# Patient Record
Sex: Female | Born: 1955 | Race: Black or African American | Hispanic: No | Marital: Single | Smoking: Never smoker
Health system: Southern US, Community
[De-identification: ages and names within clinical notes are randomized; demographics above are authoritative.]

## PROBLEM LIST (undated history)

## (undated) DIAGNOSIS — Z8639 Personal history of other endocrine, nutritional and metabolic disease: Secondary | ICD-10-CM

## (undated) DIAGNOSIS — G894 Chronic pain syndrome: Secondary | ICD-10-CM

## (undated) DIAGNOSIS — M19011 Primary osteoarthritis, right shoulder: Secondary | ICD-10-CM

## (undated) DIAGNOSIS — M19272 Secondary osteoarthritis, left ankle and foot: Secondary | ICD-10-CM

## (undated) DIAGNOSIS — K089 Disorder of teeth and supporting structures, unspecified: Secondary | ICD-10-CM

## (undated) DIAGNOSIS — D638 Anemia in other chronic diseases classified elsewhere: Secondary | ICD-10-CM

## (undated) DIAGNOSIS — R532 Functional quadriplegia: Secondary | ICD-10-CM

## (undated) DIAGNOSIS — M19012 Primary osteoarthritis, left shoulder: Secondary | ICD-10-CM

## (undated) DIAGNOSIS — Z872 Personal history of diseases of the skin and subcutaneous tissue: Secondary | ICD-10-CM

## (undated) HISTORY — DX: Chronic pain syndrome: G89.4

## (undated) HISTORY — DX: Personal history of diseases of the skin and subcutaneous tissue: Z87.2

## (undated) HISTORY — DX: Secondary osteoarthritis, left ankle and foot: M19.272

## (undated) HISTORY — DX: Anemia in other chronic diseases classified elsewhere: D63.8

## (undated) HISTORY — DX: Primary osteoarthritis, right shoulder: M19.011

## (undated) HISTORY — DX: Primary osteoarthritis, right shoulder: M19.012

## (undated) HISTORY — PX: TUBAL LIGATION: SHX77

## (undated) HISTORY — DX: Disorder of teeth and supporting structures, unspecified: K08.9

## (undated) HISTORY — DX: Functional quadriplegia: R53.2

## (undated) HISTORY — DX: Personal history of other endocrine, nutritional and metabolic disease: Z86.39

---

## 1998-11-05 ENCOUNTER — Emergency Department (HOSPITAL_COMMUNITY): Admission: EM | Admit: 1998-11-05 | Discharge: 1998-11-05 | Payer: Self-pay | Admitting: Emergency Medicine

## 1998-11-05 ENCOUNTER — Encounter: Payer: Self-pay | Admitting: Emergency Medicine

## 1998-11-06 ENCOUNTER — Emergency Department (HOSPITAL_COMMUNITY): Admission: EM | Admit: 1998-11-06 | Discharge: 1998-11-06 | Payer: Self-pay

## 1998-12-06 ENCOUNTER — Encounter: Admission: RE | Admit: 1998-12-06 | Discharge: 1998-12-20 | Payer: Self-pay | Admitting: Family Medicine

## 2000-04-13 ENCOUNTER — Emergency Department (HOSPITAL_COMMUNITY): Admission: EM | Admit: 2000-04-13 | Discharge: 2000-04-13 | Payer: Self-pay | Admitting: Emergency Medicine

## 2000-04-13 ENCOUNTER — Encounter: Payer: Self-pay | Admitting: Emergency Medicine

## 2001-02-20 ENCOUNTER — Emergency Department (HOSPITAL_COMMUNITY): Admission: EM | Admit: 2001-02-20 | Discharge: 2001-02-20 | Payer: Self-pay | Admitting: Emergency Medicine

## 2001-02-20 ENCOUNTER — Encounter: Payer: Self-pay | Admitting: Emergency Medicine

## 2001-03-03 ENCOUNTER — Emergency Department (HOSPITAL_COMMUNITY): Admission: EM | Admit: 2001-03-03 | Discharge: 2001-03-03 | Payer: Self-pay | Admitting: Emergency Medicine

## 2005-11-19 ENCOUNTER — Emergency Department (HOSPITAL_COMMUNITY): Admission: EM | Admit: 2005-11-19 | Discharge: 2005-11-19 | Payer: Self-pay | Admitting: Family Medicine

## 2007-04-18 ENCOUNTER — Emergency Department (HOSPITAL_COMMUNITY): Admission: EM | Admit: 2007-04-18 | Discharge: 2007-04-18 | Payer: Self-pay | Admitting: Emergency Medicine

## 2007-05-02 ENCOUNTER — Emergency Department (HOSPITAL_COMMUNITY): Admission: EM | Admit: 2007-05-02 | Discharge: 2007-05-02 | Payer: Self-pay | Admitting: Emergency Medicine

## 2008-08-11 ENCOUNTER — Emergency Department (HOSPITAL_COMMUNITY): Admission: EM | Admit: 2008-08-11 | Discharge: 2008-08-12 | Payer: Self-pay | Admitting: Emergency Medicine

## 2010-10-30 LAB — POCT CARDIAC MARKERS
Myoglobin, poc: 41.9
Operator id: 285841

## 2011-01-22 ENCOUNTER — Emergency Department (HOSPITAL_COMMUNITY): Payer: No Typology Code available for payment source

## 2011-01-22 ENCOUNTER — Emergency Department (HOSPITAL_COMMUNITY)
Admission: EM | Admit: 2011-01-22 | Discharge: 2011-01-23 | Disposition: A | Discharge status: Home / Self Care | Payer: No Typology Code available for payment source | Attending: Emergency Medicine | Admitting: Emergency Medicine

## 2011-01-22 ENCOUNTER — Encounter: Payer: Self-pay | Admitting: Emergency Medicine

## 2011-01-22 DIAGNOSIS — R21 Rash and other nonspecific skin eruption: Secondary | ICD-10-CM | POA: Insufficient documentation

## 2011-01-22 DIAGNOSIS — M25559 Pain in unspecified hip: Secondary | ICD-10-CM | POA: Insufficient documentation

## 2011-01-22 DIAGNOSIS — IMO0001 Reserved for inherently not codable concepts without codable children: Secondary | ICD-10-CM | POA: Insufficient documentation

## 2011-01-22 DIAGNOSIS — T148XXA Other injury of unspecified body region, initial encounter: Secondary | ICD-10-CM | POA: Insufficient documentation

## 2011-01-22 DIAGNOSIS — Y9289 Other specified places as the place of occurrence of the external cause: Secondary | ICD-10-CM | POA: Insufficient documentation

## 2011-01-22 DIAGNOSIS — Z79899 Other long term (current) drug therapy: Secondary | ICD-10-CM | POA: Insufficient documentation

## 2011-01-22 MED ORDER — DIAZEPAM 5 MG PO TABS
5.0000 mg | ORAL_TABLET | Freq: Once | ORAL | Status: DC
  Filled 2011-01-22: qty 1

## 2011-01-22 MED ORDER — HYDROCODONE-ACETAMINOPHEN 5-325 MG PO TABS
1.0000 | ORAL_TABLET | Freq: Once | ORAL | Status: DC
  Filled 2011-01-22: qty 1

## 2011-01-22 MED ORDER — IBUPROFEN 800 MG PO TABS
800.0000 mg | ORAL_TABLET | Freq: Once | ORAL | Status: AC
  Administered 2011-01-22: 800 mg via ORAL
  Filled 2011-01-22: qty 1
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
## 2011-01-23 MED ORDER — CYCLOBENZAPRINE HCL 10 MG PO TABS: 10.0000 mg | ORAL_TABLET | Freq: Three times a day (TID) | ORAL | Status: AC | PRN

## 2011-01-23 MED ORDER — IBUPROFEN 400 MG PO TABS: 400.0000 mg | ORAL_TABLET | Freq: Three times a day (TID) | ORAL | Status: AC | PRN

## 2011-01-23 MED ORDER — HYDROCODONE-ACETAMINOPHEN 5-325 MG PO TABS: 1.0000 | ORAL_TABLET | ORAL | Status: AC | PRN
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
## 2011-03-01 ENCOUNTER — Emergency Department (HOSPITAL_COMMUNITY)
Admission: EM | Admit: 2011-03-01 | Discharge: 2011-03-01 | Discharge status: Against Medical Advice | Payer: No Typology Code available for payment source

## 2011-03-02 ENCOUNTER — Emergency Department (HOSPITAL_COMMUNITY)
Admission: EM | Admit: 2011-03-02 | Discharge: 2011-03-02 | Disposition: A | Discharge status: Home / Self Care | Payer: Medicare Other | Attending: Emergency Medicine | Admitting: Emergency Medicine

## 2011-03-02 ENCOUNTER — Encounter (HOSPITAL_COMMUNITY): Payer: Self-pay | Admitting: *Deleted

## 2011-03-02 DIAGNOSIS — I10 Essential (primary) hypertension: Secondary | ICD-10-CM

## 2011-03-02 DIAGNOSIS — S7002XA Contusion of left hip, initial encounter: Secondary | ICD-10-CM

## 2011-03-02 DIAGNOSIS — M25459 Effusion, unspecified hip: Secondary | ICD-10-CM | POA: Insufficient documentation

## 2011-03-02 DIAGNOSIS — S7000XA Contusion of unspecified hip, initial encounter: Secondary | ICD-10-CM | POA: Insufficient documentation

## 2011-03-02 DIAGNOSIS — M25559 Pain in unspecified hip: Secondary | ICD-10-CM | POA: Insufficient documentation

## 2011-03-02 DIAGNOSIS — M7989 Other specified soft tissue disorders: Secondary | ICD-10-CM

## 2011-03-02 DIAGNOSIS — IMO0001 Reserved for inherently not codable concepts without codable children: Secondary | ICD-10-CM | POA: Insufficient documentation

## 2011-03-02 DIAGNOSIS — M549 Dorsalgia, unspecified: Secondary | ICD-10-CM | POA: Insufficient documentation

## 2011-03-02 MED ORDER — IBUPROFEN 800 MG PO TABS: 800.0000 mg | ORAL_TABLET | Freq: Three times a day (TID) | ORAL | Status: AC | PRN

## 2011-03-02 MED ORDER — HYDROCODONE-ACETAMINOPHEN 5-325 MG PO TABS: ORAL_TABLET | ORAL | Status: AC

## 2011-03-02 NOTE — ED Provider Notes (Signed)
Medical screening examination/treatment/procedure(s) were conducted as a shared visit with non-physician practitioner(s) and myself.  I personally evaluated the patient during the encounter   Kyrah Schiro A. Patrica Duel, MD 03/02/11 1610

## 2011-03-02 NOTE — ED Notes (Signed)
Josh EDPA notified re pt's high BP-okayed to d/c pt
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
## 2011-03-02 NOTE — Progress Notes (Signed)
Left lower extremity venous duplex completed at 12:47.  Preliminary report is negative for DVT, SVT, or a Baker's cyst in the left leg.  Negative for DVT in the right common femoral vein.
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
## 2011-06-07 ENCOUNTER — Other Ambulatory Visit (HOSPITAL_COMMUNITY): Payer: Self-pay | Admitting: Orthopedic Surgery

## 2011-06-07 ENCOUNTER — Other Ambulatory Visit (HOSPITAL_COMMUNITY): Payer: Self-pay

## 2011-06-07 DIAGNOSIS — R52 Pain, unspecified: Secondary | ICD-10-CM

## 2011-06-07 DIAGNOSIS — T148XXA Other injury of unspecified body region, initial encounter: Secondary | ICD-10-CM

## 2011-06-10 ENCOUNTER — Inpatient Hospital Stay (HOSPITAL_COMMUNITY): Admission: RE | Admit: 2011-06-10 | Payer: No Typology Code available for payment source | Source: Ambulatory Visit

## 2011-06-15 ENCOUNTER — Ambulatory Visit (HOSPITAL_COMMUNITY)
Admission: RE | Admit: 2011-06-15 | Discharge: 2011-06-15 | Payer: No Typology Code available for payment source | Source: Ambulatory Visit | Attending: Orthopedic Surgery | Admitting: Orthopedic Surgery

## 2011-06-21 ENCOUNTER — Inpatient Hospital Stay (HOSPITAL_COMMUNITY): Admission: RE | Admit: 2011-06-21 | Payer: No Typology Code available for payment source | Source: Ambulatory Visit

## 2011-10-14 ENCOUNTER — Emergency Department (HOSPITAL_BASED_OUTPATIENT_CLINIC_OR_DEPARTMENT_OTHER): Payer: Medicare Other

## 2011-10-14 ENCOUNTER — Encounter (HOSPITAL_BASED_OUTPATIENT_CLINIC_OR_DEPARTMENT_OTHER): Payer: Self-pay | Admitting: *Deleted

## 2011-10-14 ENCOUNTER — Inpatient Hospital Stay (HOSPITAL_BASED_OUTPATIENT_CLINIC_OR_DEPARTMENT_OTHER)
Admission: EM | Admit: 2011-10-14 | Discharge: 2011-10-19 | DRG: 494 | Disposition: A | Discharge status: Rehabilitation Facility | Payer: Medicare Other | Attending: Internal Medicine | Admitting: Internal Medicine

## 2011-10-14 DIAGNOSIS — E876 Hypokalemia: Secondary | ICD-10-CM | POA: Diagnosis present

## 2011-10-14 DIAGNOSIS — R531 Weakness: Secondary | ICD-10-CM

## 2011-10-14 DIAGNOSIS — D649 Anemia, unspecified: Secondary | ICD-10-CM

## 2011-10-14 DIAGNOSIS — R262 Difficulty in walking, not elsewhere classified: Secondary | ICD-10-CM

## 2011-10-14 DIAGNOSIS — S82839A Other fracture of upper and lower end of unspecified fibula, initial encounter for closed fracture: Principal | ICD-10-CM | POA: Diagnosis present

## 2011-10-14 DIAGNOSIS — Y92009 Unspecified place in unspecified non-institutional (private) residence as the place of occurrence of the external cause: Secondary | ICD-10-CM

## 2011-10-14 DIAGNOSIS — W108XXA Fall (on) (from) other stairs and steps, initial encounter: Secondary | ICD-10-CM | POA: Diagnosis present

## 2011-10-14 DIAGNOSIS — W19XXXA Unspecified fall, initial encounter: Secondary | ICD-10-CM

## 2011-10-14 DIAGNOSIS — M810 Age-related osteoporosis without current pathological fracture: Secondary | ICD-10-CM | POA: Diagnosis present

## 2011-10-14 DIAGNOSIS — I10 Essential (primary) hypertension: Secondary | ICD-10-CM

## 2011-10-14 DIAGNOSIS — M170 Bilateral primary osteoarthritis of knee: Secondary | ICD-10-CM

## 2011-10-14 DIAGNOSIS — R5381 Other malaise: Secondary | ICD-10-CM | POA: Diagnosis present

## 2011-10-14 DIAGNOSIS — S93409A Sprain of unspecified ligament of unspecified ankle, initial encounter: Secondary | ICD-10-CM | POA: Diagnosis present

## 2011-10-14 DIAGNOSIS — S82143A Displaced bicondylar fracture of unspecified tibia, initial encounter for closed fracture: Secondary | ICD-10-CM

## 2011-10-14 DIAGNOSIS — M1710 Unilateral primary osteoarthritis, unspecified knee: Secondary | ICD-10-CM | POA: Diagnosis present

## 2011-10-14 DIAGNOSIS — D509 Iron deficiency anemia, unspecified: Secondary | ICD-10-CM | POA: Diagnosis present

## 2011-10-14 DIAGNOSIS — M62838 Other muscle spasm: Secondary | ICD-10-CM

## 2011-10-14 LAB — URINALYSIS, ROUTINE W REFLEX MICROSCOPIC
Bilirubin Urine: NEGATIVE
Nitrite: NEGATIVE
Specific Gravity, Urine: 1.013 (ref 1.005–1.030)
pH: 6.5 (ref 5.0–8.0)

## 2011-10-14 LAB — CBC WITH DIFFERENTIAL/PLATELET
Eosinophils Absolute: 0 10*3/uL (ref 0.0–0.7)
Eosinophils Relative: 0 % (ref 0–5)
HCT: 26.7 % — ABNORMAL LOW (ref 36.0–46.0)
Lymphocytes Relative: 6 % — ABNORMAL LOW (ref 12–46)
Lymphs Abs: 0.8 10*3/uL (ref 0.7–4.0)
MCH: 21.3 pg — ABNORMAL LOW (ref 26.0–34.0)
MCV: 67.8 fL — ABNORMAL LOW (ref 78.0–100.0)
Monocytes Absolute: 0.6 10*3/uL (ref 0.1–1.0)
RBC: 3.94 MIL/uL (ref 3.87–5.11)
WBC: 13.9 10*3/uL — ABNORMAL HIGH (ref 4.0–10.5)

## 2011-10-14 LAB — BASIC METABOLIC PANEL
BUN: 9 mg/dL (ref 6–23)
CO2: 29 mEq/L (ref 19–32)
Chloride: 94 mEq/L — ABNORMAL LOW (ref 96–112)
Creatinine, Ser: 0.4 mg/dL — ABNORMAL LOW (ref 0.50–1.10)
Glucose, Bld: 134 mg/dL — ABNORMAL HIGH (ref 70–99)
Potassium: 2.7 mEq/L — CL (ref 3.5–5.1)

## 2011-10-14 MED ORDER — OXYCODONE-ACETAMINOPHEN 5-325 MG PO TABS
1.0000 | ORAL_TABLET | Freq: Once | ORAL | Status: DC
  Filled 2011-10-14 (×2): qty 1

## 2011-10-14 MED ORDER — ONDANSETRON HCL 4 MG/2ML IJ SOLN
4.0000 mg | Freq: Once | INTRAMUSCULAR | Status: AC
  Administered 2011-10-14: 4 mg via INTRAVENOUS
  Filled 2011-10-14: qty 2

## 2011-10-14 MED ORDER — HYDROMORPHONE HCL PF 1 MG/ML IJ SOLN
INTRAMUSCULAR | Status: AC
  Administered 2011-10-14: 1 mg via INTRAVENOUS
  Filled 2011-10-14: qty 1

## 2011-10-14 MED ORDER — POTASSIUM CHLORIDE 10 MEQ/100ML IV SOLN
10.0000 meq | INTRAVENOUS | Status: AC
  Administered 2011-10-14 (×2): 10 meq via INTRAVENOUS
  Filled 2011-10-14 (×2): qty 100

## 2011-10-14 MED ORDER — ONDANSETRON HCL 4 MG/2ML IJ SOLN
4.0000 mg | Freq: Three times a day (TID) | INTRAMUSCULAR | Status: AC | PRN
  Administered 2011-10-14: 4 mg via INTRAVENOUS

## 2011-10-14 MED ORDER — ONDANSETRON HCL 4 MG/2ML IJ SOLN
INTRAMUSCULAR | Status: AC
  Administered 2011-10-14: 4 mg via INTRAVENOUS
  Filled 2011-10-14: qty 2

## 2011-10-14 MED ORDER — HYDROMORPHONE HCL PF 1 MG/ML IJ SOLN
1.0000 mg | Freq: Once | INTRAMUSCULAR | Status: AC
  Administered 2011-10-14: 1 mg via INTRAVENOUS
  Filled 2011-10-14: qty 1

## 2011-10-14 MED ORDER — HYDROMORPHONE HCL PF 1 MG/ML IJ SOLN
1.0000 mg | INTRAMUSCULAR | Status: AC | PRN
  Administered 2011-10-14: 1 mg via INTRAVENOUS

## 2011-10-14 MED ORDER — SODIUM CHLORIDE 0.9 % IV SOLN
Freq: Once | INTRAVENOUS | Status: AC
  Administered 2011-10-14: 22:00:00 via INTRAVENOUS

## 2011-10-14 NOTE — ED Notes (Signed)
Pt's potassium 2.7 reported to EDP Preston Fleeting
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
## 2011-10-14 NOTE — ED Notes (Signed)
OK for pt to eat per EDP Preston Fleeting

## 2011-10-14 NOTE — ED Notes (Signed)
Pt ate a few bites of applesauce, now vomiting- EDP notified
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
## 2011-10-14 NOTE — ED Notes (Signed)
Pt declined percocet at this time- states she needs something to eat

## 2011-10-14 NOTE — ED Notes (Signed)
Discussed need for in/out cath. Pt refused at this time. EDP Preston Fleeting made aware

## 2011-10-14 NOTE — ED Notes (Signed)
Glick MD at bedside  
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
## 2011-10-15 ENCOUNTER — Inpatient Hospital Stay (HOSPITAL_COMMUNITY): Payer: Medicare Other

## 2011-10-15 ENCOUNTER — Encounter (HOSPITAL_COMMUNITY): Payer: Self-pay | Admitting: Internal Medicine

## 2011-10-15 DIAGNOSIS — R5381 Other malaise: Secondary | ICD-10-CM

## 2011-10-15 DIAGNOSIS — D649 Anemia, unspecified: Secondary | ICD-10-CM

## 2011-10-15 DIAGNOSIS — W19XXXA Unspecified fall, initial encounter: Secondary | ICD-10-CM

## 2011-10-15 DIAGNOSIS — E876 Hypokalemia: Secondary | ICD-10-CM | POA: Diagnosis present

## 2011-10-15 DIAGNOSIS — M62838 Other muscle spasm: Secondary | ICD-10-CM

## 2011-10-15 DIAGNOSIS — R262 Difficulty in walking, not elsewhere classified: Secondary | ICD-10-CM

## 2011-10-15 DIAGNOSIS — S82109A Unspecified fracture of upper end of unspecified tibia, initial encounter for closed fracture: Secondary | ICD-10-CM

## 2011-10-15 LAB — BASIC METABOLIC PANEL
CO2: 27 mEq/L (ref 19–32)
Calcium: 9.6 mg/dL (ref 8.4–10.5)
Chloride: 95 mEq/L — ABNORMAL LOW (ref 96–112)
Creatinine, Ser: 0.38 mg/dL — ABNORMAL LOW (ref 0.50–1.10)
Glucose, Bld: 124 mg/dL — ABNORMAL HIGH (ref 70–99)
Sodium: 132 mEq/L — ABNORMAL LOW (ref 135–145)

## 2011-10-15 LAB — CBC
HCT: 26.5 % — ABNORMAL LOW (ref 36.0–46.0)
MCH: 20.7 pg — ABNORMAL LOW (ref 26.0–34.0)
MCV: 67.6 fL — ABNORMAL LOW (ref 78.0–100.0)
Platelets: 506 10*3/uL — ABNORMAL HIGH (ref 150–400)
RBC: 3.92 MIL/uL (ref 3.87–5.11)
WBC: 10.2 10*3/uL (ref 4.0–10.5)

## 2011-10-15 LAB — IRON AND TIBC
Iron: 13 ug/dL — ABNORMAL LOW (ref 42–135)
Saturation Ratios: 5 % — ABNORMAL LOW (ref 20–55)
UIBC: 240 ug/dL (ref 125–400)

## 2011-10-15 LAB — FERRITIN: Ferritin: 581 ng/mL — ABNORMAL HIGH (ref 10–291)

## 2011-10-15 LAB — FOLATE: Folate: 7.3 ng/mL

## 2011-10-15 MED ORDER — ONDANSETRON HCL 4 MG/2ML IJ SOLN: 4.0000 mg | Freq: Four times a day (QID) | INTRAMUSCULAR | Status: DC | PRN

## 2011-10-15 MED ORDER — METHOCARBAMOL 100 MG/ML IJ SOLN
500.0000 mg | Freq: Four times a day (QID) | INTRAMUSCULAR | Status: DC | PRN
  Filled 2011-10-15: qty 5

## 2011-10-15 MED ORDER — ACETAMINOPHEN 650 MG RE SUPP: 650.0000 mg | Freq: Four times a day (QID) | RECTAL | Status: DC | PRN

## 2011-10-15 MED ORDER — ONDANSETRON HCL 4 MG PO TABS: 4.0000 mg | ORAL_TABLET | Freq: Four times a day (QID) | ORAL | Status: DC | PRN

## 2011-10-15 MED ORDER — HYDROMORPHONE HCL PF 1 MG/ML IJ SOLN
0.5000 mg | INTRAMUSCULAR | Status: DC | PRN
  Administered 2011-10-15 – 2011-10-16 (×5): 1 mg via INTRAVENOUS
  Administered 2011-10-17: 0.5 mg via INTRAVENOUS
  Administered 2011-10-17 (×3): 1 mg via INTRAVENOUS
  Filled 2011-10-15 (×9): qty 1

## 2011-10-15 MED ORDER — ACETAMINOPHEN 325 MG PO TABS
650.0000 mg | ORAL_TABLET | Freq: Four times a day (QID) | ORAL | Status: DC | PRN
  Administered 2011-10-15 – 2011-10-19 (×4): 650 mg via ORAL
  Filled 2011-10-15 (×4): qty 2

## 2011-10-15 MED ORDER — AMLODIPINE BESYLATE 5 MG PO TABS
5.0000 mg | ORAL_TABLET | Freq: Every day | ORAL | Status: DC
  Administered 2011-10-15 – 2011-10-16 (×2): 5 mg via ORAL
  Filled 2011-10-15 (×2): qty 1

## 2011-10-15 MED ORDER — SODIUM CHLORIDE 0.9 % IV SOLN
INTRAVENOUS | Status: DC
  Administered 2011-10-15: 19:00:00 via INTRAVENOUS
  Administered 2011-10-16: 500 mL via INTRAVENOUS
  Administered 2011-10-16: 12:00:00 via INTRAVENOUS

## 2011-10-15 MED ORDER — SODIUM CHLORIDE 0.9 % IV SOLN
INTRAVENOUS | Status: AC
  Administered 2011-10-15: 01:00:00 via INTRAVENOUS

## 2011-10-15 MED ORDER — POTASSIUM CHLORIDE 10 MEQ/100ML IV SOLN
10.0000 meq | INTRAVENOUS | Status: AC
  Administered 2011-10-15 (×4): 10 meq via INTRAVENOUS
  Filled 2011-10-15 (×4): qty 100

## 2011-10-15 MED ORDER — HYDRALAZINE HCL 20 MG/ML IJ SOLN
10.0000 mg | Freq: Four times a day (QID) | INTRAMUSCULAR | Status: DC | PRN
  Administered 2011-10-15 – 2011-10-19 (×5): 10 mg via INTRAVENOUS
  Filled 2011-10-15 (×6): qty 1

## 2011-10-15 NOTE — Progress Notes (Signed)
Pt became nauseated and vomited a small amount once this am. Pt was offered antiemetic but refused, pt was also offered pain medication and refused. Will continue to monitor pt.

## 2011-10-15 NOTE — Progress Notes (Signed)
My associate saw and admitted patient earlier this AM.  Please see H and P for further information.  Agree with the following adjustments based on elevated blood pressure.  Place on amlodipine and continue to monitor blood pressures closely.  Taneeka Curtner, [employer]
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
## 2011-10-15 NOTE — Progress Notes (Signed)
Patient nauseated/vomited small amount. Offered antiemetic; daughter refused. I gave her pain med and nausea subsided. I will continue to monitor her throughout evening.

## 2011-10-15 NOTE — Progress Notes (Signed)
Utilization review completed.  

## 2011-10-15 NOTE — Progress Notes (Signed)
At 0400 pt had not yet voided. She was placed on bed-pan; voided . Performed bladder scan to determine if she is retaining urine. No urine in bladder per scan.

## 2011-10-16 ENCOUNTER — Inpatient Hospital Stay (HOSPITAL_COMMUNITY): Payer: Medicare Other

## 2011-10-16 ENCOUNTER — Encounter (HOSPITAL_COMMUNITY): Payer: Self-pay | Admitting: Anesthesiology

## 2011-10-16 ENCOUNTER — Encounter (HOSPITAL_COMMUNITY)
Admission: EM | Disposition: A | Discharge status: Rehabilitation Facility | Payer: Self-pay | Source: Home / Self Care | Attending: Internal Medicine

## 2011-10-16 ENCOUNTER — Inpatient Hospital Stay (HOSPITAL_COMMUNITY): Payer: Medicare Other | Admitting: Anesthesiology

## 2011-10-16 DIAGNOSIS — S82839A Other fracture of upper and lower end of unspecified fibula, initial encounter for closed fracture: Principal | ICD-10-CM

## 2011-10-16 HISTORY — PX: KNEE CLOSED REDUCTION: SHX995

## 2011-10-16 LAB — CBC
HCT: 24.8 % — ABNORMAL LOW (ref 36.0–46.0)
Hemoglobin: 7.8 g/dL — ABNORMAL LOW (ref 12.0–15.0)
MCH: 21 pg — ABNORMAL LOW (ref 26.0–34.0)
MCHC: 31.5 g/dL (ref 30.0–36.0)
MCV: 66.8 fL — ABNORMAL LOW (ref 78.0–100.0)
RDW: 16.5 % — ABNORMAL HIGH (ref 11.5–15.5)

## 2011-10-16 LAB — ABO/RH: ABO/RH(D): B POS

## 2011-10-16 LAB — BASIC METABOLIC PANEL
BUN: 8 mg/dL (ref 6–23)
Calcium: 9.3 mg/dL (ref 8.4–10.5)
Creatinine, Ser: 0.37 mg/dL — ABNORMAL LOW (ref 0.50–1.10)
GFR calc Af Amer: >90 mL/min (ref >90)
GFR calc non Af Amer: >90 mL/min (ref >90)
Glucose, Bld: 122 mg/dL — ABNORMAL HIGH (ref 70–99)

## 2011-10-16 SURGERY — MANIPULATION, KNEE, CLOSED
Anesthesia: General | Laterality: Left | Wound class: Clean

## 2011-10-16 MED ORDER — POTASSIUM CHLORIDE 10 MEQ/100ML IV SOLN
10.0000 meq | INTRAVENOUS | Status: AC
  Administered 2011-10-16 (×4): 10 meq via INTRAVENOUS
  Filled 2011-10-16 (×4): qty 100

## 2011-10-16 MED ORDER — CEFAZOLIN SODIUM-DEXTROSE 2-3 GM-% IV SOLR
INTRAVENOUS | Status: DC | PRN
  Administered 2011-10-16: 2 g via INTRAVENOUS

## 2011-10-16 MED ORDER — LABETALOL HCL 5 MG/ML IV SOLN
INTRAVENOUS | Status: DC | PRN
  Administered 2011-10-16: 5 mg via INTRAVENOUS
  Administered 2011-10-16: 10 mg via INTRAVENOUS

## 2011-10-16 MED ORDER — LIDOCAINE HCL (CARDIAC) 20 MG/ML IV SOLN
INTRAVENOUS | Status: DC | PRN
  Administered 2011-10-16: 80 mg via INTRAVENOUS

## 2011-10-16 MED ORDER — LACTATED RINGERS IV SOLN
INTRAVENOUS | Status: DC | PRN
  Administered 2011-10-16: 08:00:00 via INTRAVENOUS

## 2011-10-16 MED ORDER — PROPOFOL 10 MG/ML IV BOLUS
INTRAVENOUS | Status: DC | PRN
  Administered 2011-10-16: 180 mg via INTRAVENOUS

## 2011-10-16 MED ORDER — HYDRALAZINE HCL 20 MG/ML IJ SOLN
5.0000 mg | Freq: Once | INTRAMUSCULAR | Status: AC
  Administered 2011-10-16: 5 mg via INTRAVENOUS

## 2011-10-16 MED ORDER — MIDAZOLAM HCL 5 MG/5ML IJ SOLN
INTRAMUSCULAR | Status: DC | PRN
  Administered 2011-10-16: 2 mg via INTRAVENOUS

## 2011-10-16 MED ORDER — HYDRALAZINE HCL 20 MG/ML IJ SOLN
INTRAMUSCULAR | Status: AC
  Filled 2011-10-16: qty 1

## 2011-10-16 MED ORDER — SUCCINYLCHOLINE CHLORIDE 20 MG/ML IJ SOLN
INTRAMUSCULAR | Status: DC | PRN
  Administered 2011-10-16: 100 mg via INTRAVENOUS
  Administered 2011-10-16: 35 mg via INTRAVENOUS

## 2011-10-16 MED ORDER — ACETAMINOPHEN 10 MG/ML IV SOLN
INTRAVENOUS | Status: DC | PRN
  Administered 2011-10-16: 1000 mg via INTRAVENOUS

## 2011-10-16 MED ORDER — ALBUTEROL SULFATE (5 MG/ML) 0.5% IN NEBU
2.5000 mg | INHALATION_SOLUTION | Freq: Four times a day (QID) | RESPIRATORY_TRACT | Status: DC | PRN
  Filled 2011-10-16: qty 0.5

## 2011-10-16 MED ORDER — OXYCODONE HCL 5 MG/5ML PO SOLN
5.0000 mg | Freq: Once | ORAL | Status: DC | PRN
  Filled 2011-10-16: qty 5

## 2011-10-16 MED ORDER — OXYCODONE-ACETAMINOPHEN 5-325 MG PO TABS
1.0000 | ORAL_TABLET | Freq: Four times a day (QID) | ORAL | Status: DC | PRN
  Administered 2011-10-17: 1 via ORAL
  Filled 2011-10-16 (×2): qty 1

## 2011-10-16 MED ORDER — GLYCOPYRROLATE 0.2 MG/ML IJ SOLN
INTRAMUSCULAR | Status: DC | PRN
  Administered 2011-10-16: 0.6 mg via INTRAVENOUS

## 2011-10-16 MED ORDER — HYDROMORPHONE HCL PF 1 MG/ML IJ SOLN: 0.2500 mg | INTRAMUSCULAR | Status: DC | PRN

## 2011-10-16 MED ORDER — OXYCODONE HCL 5 MG PO TABS: 5.0000 mg | ORAL_TABLET | Freq: Once | ORAL | Status: DC | PRN

## 2011-10-16 MED ORDER — POTASSIUM CHLORIDE CRYS ER 20 MEQ PO TBCR
20.0000 meq | EXTENDED_RELEASE_TABLET | Freq: Once | ORAL | Status: AC
  Administered 2011-10-16: 20 meq via ORAL
  Filled 2011-10-16: qty 1

## 2011-10-16 MED ORDER — MEPERIDINE HCL 50 MG/ML IJ SOLN: 6.2500 mg | INTRAMUSCULAR | Status: DC | PRN

## 2011-10-16 MED ORDER — FENTANYL CITRATE 0.05 MG/ML IJ SOLN
INTRAMUSCULAR | Status: DC | PRN
  Administered 2011-10-16: 100 ug via INTRAVENOUS
  Administered 2011-10-16 (×2): 50 ug via INTRAVENOUS

## 2011-10-16 MED ORDER — LACTATED RINGERS IV SOLN: INTRAVENOUS | Status: DC

## 2011-10-16 MED ORDER — ROCURONIUM BROMIDE 100 MG/10ML IV SOLN
INTRAVENOUS | Status: DC | PRN
  Administered 2011-10-16: 5 mg via INTRAVENOUS

## 2011-10-16 MED ORDER — ACETAMINOPHEN 10 MG/ML IV SOLN: 1000.0000 mg | Freq: Once | INTRAVENOUS | Status: DC | PRN

## 2011-10-16 MED ORDER — PROMETHAZINE HCL 25 MG/ML IJ SOLN: 6.2500 mg | INTRAMUSCULAR | Status: DC | PRN

## 2011-10-16 MED ORDER — NEOSTIGMINE METHYLSULFATE 1 MG/ML IJ SOLN
INTRAMUSCULAR | Status: DC | PRN
  Administered 2011-10-16: 4 mg via INTRAVENOUS

## 2011-10-16 MED ORDER — SODIUM CHLORIDE 0.9 % IV SOLN
INTRAVENOUS | Status: DC | PRN
  Administered 2011-10-16: 09:00:00 via INTRAVENOUS

## 2011-10-16 MED ORDER — AMLODIPINE BESYLATE 10 MG PO TABS
10.0000 mg | ORAL_TABLET | Freq: Every day | ORAL | Status: DC
  Administered 2011-10-17 – 2011-10-19 (×3): 10 mg via ORAL
  Filled 2011-10-16 (×3): qty 1

## 2011-10-16 SURGICAL SUPPLY — 46 items
BAG ZIPLOCK 12X15 (MISCELLANEOUS) IMPLANT
BNDG COHESIVE 4X5 TAN STRL (GAUZE/BANDAGES/DRESSINGS) IMPLANT
BNDG COHESIVE 6X5 TAN STRL LF (GAUZE/BANDAGES/DRESSINGS) ×3 IMPLANT
CLOTH BEACON ORANGE TIMEOUT ST (SAFETY) ×3 IMPLANT
CUFF TOURN SGL QUICK 34 (TOURNIQUET CUFF) ×1
CUFF TRNQT CYL 34X4X40X1 (TOURNIQUET CUFF) ×2 IMPLANT
DRAPE C-ARM 42X72 X-RAY (DRAPES) ×3 IMPLANT
DRAPE INCISE IOBAN 66X45 STRL (DRAPES) ×3 IMPLANT
DRAPE LG THREE QUARTER DISP (DRAPES) ×3 IMPLANT
DRAPE U-SHAPE 47X51 STRL (DRAPES) ×3 IMPLANT
DRSG EMULSION OIL 3X16 NADH (GAUZE/BANDAGES/DRESSINGS) IMPLANT
DURAPREP 26ML APPLICATOR (WOUND CARE) ×3 IMPLANT
ELECT REM PT RETURN 9FT ADLT (ELECTROSURGICAL) ×3
ELECTRODE REM PT RTRN 9FT ADLT (ELECTROSURGICAL) ×2 IMPLANT
GLOVE BIOGEL PI IND STRL 7.5 (GLOVE) IMPLANT
GLOVE BIOGEL PI IND STRL 8 (GLOVE) ×2 IMPLANT
GLOVE BIOGEL PI INDICATOR 7.5 (GLOVE)
GLOVE BIOGEL PI INDICATOR 8 (GLOVE) ×1
GLOVE ECLIPSE 8.0 STRL XLNG CF (GLOVE) IMPLANT
GLOVE ORTHO TXT STRL SZ7.5 (GLOVE) ×6 IMPLANT
GLOVE SURG ORTHO 8.0 STRL STRW (GLOVE) ×3 IMPLANT
GOWN BRE IMP PREV XXLGXLNG (GOWN DISPOSABLE) ×6 IMPLANT
GOWN STRL NON-REIN LRG LVL3 (GOWN DISPOSABLE) ×3 IMPLANT
IMMOBILIZER KNEE 20 (SOFTGOODS) ×6
IMMOBILIZER KNEE 20 THIGH 36 (SOFTGOODS) ×4 IMPLANT
KIT BASIN OR (CUSTOM PROCEDURE TRAY) ×3 IMPLANT
MANIFOLD NEPTUNE II (INSTRUMENTS) ×3 IMPLANT
NS IRRIG 1000ML POUR BTL (IV SOLUTION) IMPLANT
PACK LOWER EXTREMITY WL (CUSTOM PROCEDURE TRAY) IMPLANT
PACK TOTAL JOINT (CUSTOM PROCEDURE TRAY) ×3 IMPLANT
PAD CAST 4YDX4 CTTN HI CHSV (CAST SUPPLIES) ×2 IMPLANT
PADDING CAST COTTON 4X4 STRL (CAST SUPPLIES) ×1
POSITIONER SURGICAL ARM (MISCELLANEOUS) ×3 IMPLANT
SET PAD KNEE POSITIONER (MISCELLANEOUS) ×3 IMPLANT
SPONGE GAUZE 4X4 12PLY (GAUZE/BANDAGES/DRESSINGS) ×3 IMPLANT
SPONGE LAP 18X18 X RAY DECT (DISPOSABLE) ×6 IMPLANT
STOCKINETTE 8 INCH (MISCELLANEOUS) ×3 IMPLANT
STRIP CLOSURE SKIN 1/2X4 (GAUZE/BANDAGES/DRESSINGS) IMPLANT
SUCTION FRAZIER TIP 10 FR DISP (SUCTIONS) ×3 IMPLANT
SUT ETHILON 4 0 PS 2 18 (SUTURE) IMPLANT
SUT MNCRL AB 4-0 PS2 18 (SUTURE) ×3 IMPLANT
SUT VIC AB 1 CT1 27 (SUTURE) ×5
SUT VIC AB 1 CT1 27XBRD ANTBC (SUTURE) ×10 IMPLANT
SUT VIC AB 2-0 CT2 27 (SUTURE) IMPLANT
TOWEL OR 17X26 10 PK STRL BLUE (TOWEL DISPOSABLE) ×6 IMPLANT
WATER STERILE IRR 1500ML POUR (IV SOLUTION) IMPLANT

## 2011-10-16 NOTE — Addendum Note (Signed)
Addendum  created 10/16/11 1147 by Bevelyn Buckles, CRNA   Modules edited:Anesthesia LDA

## 2011-10-16 NOTE — Addendum Note (Signed)
Addendum  created 10/16/11 1156 by Bevelyn Buckles, CRNA   Modules edited:Anesthesia LDA

## 2011-10-16 NOTE — Progress Notes (Signed)
[facility] PROGRESS NOTE  April Graves XLK:[id number] DOB: 16-Jul-1955 DOA: 10/14/2011 PCP: No primary provider on file.  Assessment/Plan: Active Problems:  Hypokalemia  Fibula upper end fracture  Anemia  Fall  Unable to walk  Weakness generalized  Muscle spasms of lower extremity  Arthritis of both knees  Osteoporosis  1. Tibial Plateau fracture - Per orthopaedic surgeon.  Was taken to OR but reportedly did not get operation and it was decided to treat conservatively.  Note is not available for review at this time. But reportedly patient will need physical therapy.  Will follow up with ortho's recs once they are available.  2. Hypokalemia - Will replace today.  May be secondary to poor oral intake.  3. Anemia - No active bleeding recently - Will continue to monitor and consider transfusion if patient's hemoglobin decreases past 7.0  4. Hypertension - not controlled.  Will increase amlodipine today - May be exacerbated 2ary to discomfort.  5. DVT prophylaxis - Per ortho   Code Status: full Family Communication: spoke with patient and daughter at bedside Disposition Plan: Pending clinical improvement   Brief narrative: Please refer to HPI  Consultants:  Ortho  Antibiotics:  None  HPI/Subjective: Patient has some localized discomfort which has improved with the current pain regimen. Has questions regarding Ortho plans which I was not able to address during our visit.  Objective: Filed Vitals:   10/16/11 1149 10/16/11 1150 10/16/11 1211 10/16/11 1300  BP: 192/97 196/100 188/109 180/107  Pulse: 88 87 85 99  Temp:  98 F (36.7 C) 97.5 F (36.4 C) 97.8 F (36.6 C)  TempSrc:    Oral  Resp: 20 28 24 27   Height:      Weight:      SpO2: 96% 96% 92% 97%    Intake/Output Summary (Last 24 hours) at 10/16/11 1901 Last data filed at 10/16/11 1300  Gross per 24 hour  Intake   3305 ml  Output   1350 ml  Net   1955 ml   Filed Weights   10/14/11  1818 10/15/11 0048  Weight: 77.111 kg (170 lb) 68.7 kg (151 lb 7.3 oz)    Exam:   General:  Pt in NAD, A and O x 3  Cardiovascular: RRR, No MRG  Respiratory: CTA BL, no wheezes  Abdomen: Soft, NT  Extremities: Braces in place BL at lower extremities.  Data Reviewed: Basic Metabolic Panel:  Lab 10/16/11 3664 10/15/11 0456 10/14/11 2020  NA 131* 132* 134*  K 3.1* 3.6 2.7*  CL 96 95* 94*  CO2 26 27 29   GLUCOSE 122* 124* 134*  BUN 8 8 9   CREATININE 0.37* 0.38* 0.40*  CALCIUM 9.3 9.6 10.0  MG -- 1.8 --  PHOS -- -- --   Liver Function Tests: No results found for this basename: AST:5,ALT:5,ALKPHOS:5,BILITOT:5,PROT:5,ALBUMIN:5 in the last 168 hours No results found for this basename: LIPASE:5,AMYLASE:5 in the last 168 hours No results found for this basename: AMMONIA:5 in the last 168 hours CBC:  Lab 10/16/11 0458 10/15/11 0456 10/14/11 2020  WBC 9.6 10.2 13.9*  NEUTROABS -- -- 12.4*  HGB 7.8* 8.1* 8.4*  HCT 24.8* 26.5* 26.7*  MCV 66.8* 67.6* 67.8*  PLT 468* 506* 535*   Cardiac Enzymes: No results found for this basename: CKTOTAL:5,CKMB:5,CKMBINDEX:5,TROPONINI:5 in the last 168 hours BNP (last 3 results) No results found for this basename: PROBNP:3 in the last 8760 hours CBG: No results found for this basename: GLUCAP:5 in the last 168 hours  Recent Results (from the past 240 hour(s))  SURGICAL PCR SCREEN     Status: Abnormal   Collection Time   10/16/11  7:42 AM      Component Value Range Status Comment   MRSA, PCR NEGATIVE  NEGATIVE Final    Staphylococcus aureus POSITIVE (*) NEGATIVE Final      Studies: Dg Ankle Complete Left  Oct 27, 2011  *RADIOLOGY REPORT*  Clinical Data: Left ankle pain following a fall.  LEFT ANKLE COMPLETE - 3+ VIEW  Comparison: None.  Findings: Diffuse soft tissue swelling, most pronounced laterally. Diffuse osteopenia.  Possible effusion.  No fracture or dislocation seen.  Inferior and posterior calcaneal spurs.  IMPRESSION:  1.   Possible effusion. 2.  No fracture or dislocation seen. 3.  Diffuse osteopenia and calcaneal spurs.   Original Report Authenticated By: Darrol Angel, M.D.    Ct Knee Left Wo Contrast  10/16/2011  *RADIOLOGY REPORT*  Clinical Data: Knee pain status post fall.  Evaluate for tibial plateau fracture.  CT OF THE LEFT KNEE WITHOUT CONTRAST  Technique:  Multidetector CT imaging was performed according to the standard protocol. Multiplanar CT image reconstructions were also generated.  Comparison: Radiographs 10/27/11.  Findings: There is a moderate sized hemarthrosis.  No layering intra-articular fat is demonstrated.  There is osteopenia with tricompartmental degenerative change.  In the medial compartment, there is advanced joint space loss with subchondral cyst formation in the femoral condyle and tibial plateau.  As demonstrated radiographically, there is minimal articular surface irregularity of the medial tibial plateau peripherally.  This is favored to reflect degenerative change, although could reflect a small fracture. There is no depression of the articular surface.  There is a mildly displaced fracture of the fibular neck.  This demonstrates no extension into the proximal tibiofibular joint.  No other acute fractures are identified.  There are patellofemoral and lateral compartment degenerative changes as well.  An intramuscular lipoma is noted incidentally within the popliteus muscle.  There is mild generalized muscular atrophy surrounding the knee.  IMPRESSION:  1.  Mildly displaced fracture of the fibular neck. 2.  Peripheral articular surface irregularity of the medial tibial plateau is favored to reflect degenerative change, although could reflect a small fracture. 3.  No other acute osseous findings.  Moderate sized hemarthrosis. 4.  Tricompartmental degenerative changes with subchondral cyst formation.   Original Report Authenticated By: Gerrianne Scale, M.D.    Ct Knee Right Wo  Contrast  10/16/2011  *RADIOLOGY REPORT*  Clinical Data: Knee pain status post fall.  Evaluate for occult tibial plateau fracture.  CT OF THE RIGHT KNEE WITHOUT CONTRAST  Technique:  Multidetector CT imaging was performed according to the standard protocol. Multiplanar CT image reconstructions were also generated.  Comparison: Radiographs 10/27/2011.  Findings: There is a moderate sized hemarthrosis.  No layering intra-articular fat is demonstrated.  There is generalized osteopenia.  There are tricompartmental degenerative changes with joint space loss and subchondral cyst formation in both femoral condyles and tibial plateaus.  A dominant 2.1 cm subchondral cyst anteriorly in the lateral tibial plateau has sclerotic margins and is likely a geode.  No cortical fracture or depressed tibial plateau fracture is demonstrated.  There is no dislocation.  The extensor mechanism is intact.  As evaluated by CT, the cruciate ligaments appear intact.  IMPRESSION:  1.  No CT evidence of acute fracture or dislocation. 2.  Tricompartmental degenerative changes with subchondral cyst formation, most pronounced anteriorly in the lateral tibial plateau. 3.  Moderate sized hemarthrosis.  If the patient fails to respond to conservative therapy, follow-up MRI may be helpful to exclude occult fracture or internal derangement.   Original Report Authenticated By: Gerrianne Scale, M.D.    Dg Knee Complete 4 Views Left  10/14/2011  *RADIOLOGY REPORT*  Clinical Data: Left knee pain following a fall down stairs.  LEFT KNEE - COMPLETE 4+ VIEW  Comparison: None.  Findings: Diffuse osteopenia.  Moderate sized effusion.  Moderate medial joint space narrowing.  Small fracture of the articular surface of the medial aspect of the medial tibial plateau.  On one of the oblique views, there is also a suggestion of a minimally displaced lateral tibial plateau in the metaphyseal region.  There is also a mildly displaced fibular head and neck  fracture, best seen on the other oblique view.  IMPRESSION:  1.  Small medial tibial plateau fracture and probable minimally displaced lateral tibial plateau fracture. 2.  Mildly displaced fibular head and neck fracture. 3.  Moderate sized effusion.   Original Report Authenticated By: Darrol Angel, M.D.    Dg Knee Complete 4 Views Right  10/14/2011  *RADIOLOGY REPORT*  Clinical Data: Right knee pain following a fall.  RIGHT KNEE - COMPLETE 4+ VIEW  Comparison: None.  Findings: Diffuse osteopenia.  Moderate sized effusion.  No visible fracture or dislocation.  IMPRESSION:  1.  Moderate sized effusion. 2.  Diffuse osteopenia with no visible fracture.   Original Report Authenticated By: Darrol Angel, M.D.    Dg C-arm 1-60 Min-no Report  10/16/2011  CLINICAL DATA: left Tibial Plateau fracture   C-ARM 1-60 MINUTES  Fluoroscopy was utilized by the requesting physician.  No radiographic  interpretation.     Dg Knee 2 Views Left  10/16/2011  *RADIOLOGY REPORT*  Clinical Data: Closed reduction of fibular fracture.  LEFT KNEE - 3 VIEW  Comparison: CT on 10/15/2011  Findings: Proximal fibular fracture is seen, and is seen in near anatomic alignment.  No other fractures identified.  IMPRESSION: Proximal fibular fracture in near anatomic alignment.   Original Report Authenticated By: Danae Orleans, M.D.     Scheduled Meds:   . amLODipine  5 mg Oral Daily  . hydrALAZINE      . hydrALAZINE  5 mg Intravenous Once  . potassium chloride  10 mEq Intravenous Q1 Hr x 4  . DISCONTD: oxyCODONE-acetaminophen  1 tablet Oral Once   Continuous Infusions:   . sodium chloride 75 mL/hr at 10/16/11 1150  . DISCONTD: lactated ringers      Active Problems:  Hypokalemia  Fibula upper end fracture  Anemia  Fall  Unable to walk  Weakness generalized  Muscle spasms of lower extremity  Arthritis of both knees  Osteoporosis    Time spent: > 35 minutes    Penny Pia  [facility] Pager [phone]. If  8PM-8AM, please contact night-coverage at [email or web address], password [facility] 10/16/2011, 7:01 PM  LOS: 2 days

## 2011-10-16 NOTE — Progress Notes (Signed)
Apresoline 5 mg IVP GIVEN AS ORDERED.

## 2011-10-16 NOTE — Brief Op Note (Signed)
10/14/2011 - 10/16/2011  10:01 AM  PATIENT:  April Graves  56 y.o. female  PRE-OPERATIVE DIAGNOSIS:  Left Tibial Plateau Fracture  POST-OPERATIVE DIAGNOSIS:  Proximal Fibula Fracture, Osteoporosis, Osteoarthritis and flexion contracture  PROCEDURE:  Procedure(s) (LRB) with comments: CLOSED MANIPULATION KNEE () - Proxmial Fibula Closed treatment of left proximal fibular fracture  SURGEON:  Surgeon(s) and Role:    * [name], MD - Primary  PHYSICIAN ASSISTANT: Shelly Coss  ANESTHESIA:   general  EBL:  Total I/O In: 350 [Blood:350] Out: -   BLOOD ADMINISTERED:1 unit PRBC  DRAINS: none   LOCAL MEDICATIONS USED:  NONE  SPECIMEN:  No Specimen  DISPOSITION OF SPECIMEN:  N/A  COUNTS:  NO as procedure was clsoed no instruments used  TOURNIQUET:  * Missing tourniquet times found for documented tourniquets in log:  16109 *  DICTATION: .Other Dictation: Dictation Number [id number]  PLAN OF CARE: Admit to inpatient   PATIENT DISPOSITION:  PACU - hemodynamically stable.   Delay start of Pharmacological VTE agent (>24hrs) due to surgical blood loss or risk of bleeding: no

## 2011-10-16 NOTE — Progress Notes (Signed)
Dr. Renold Don made aware of patient's blood pressures- orders given

## 2011-10-16 NOTE — Anesthesia Postprocedure Evaluation (Signed)
Anesthesia Post Note  Patient: April Graves  Procedure(s) Performed: Procedure(s) (LRB): CLOSED MANIPULATION KNEE ()  Anesthesia type: General  Patient location: PACU  Post pain: Pain level controlled  Post assessment: Post-op Vital signs reviewed  Last Vitals: BP 199/107  Pulse 107  Temp 36.9 C (Oral)  Resp 24  Ht 5\' 4"  (1.626 m)  Wt 151 lb 7.3 oz (68.7 kg)  BMI 26.00 kg/m2  SpO2 92%  Post vital signs: Reviewed  Level of consciousness: sedated  Complications: No apparent anesthesia complications

## 2011-10-16 NOTE — Progress Notes (Signed)
Patient ID: April Graves, female   DOB: Jul 22, 1955, 56 y.o.   MRN: [id number]  Relatively comfortable   No events  To OR today for ORIF LEFT tibial plateau fracture Ordered potassium to be given today  Will make sure there is active type and cross for OR today due to drop in Hgb  Consent on chart

## 2011-10-16 NOTE — Progress Notes (Signed)
Dr. Renold Don made aware of patient's blood pressures- O.k. To go back to floor- Dr. Charlann Boxer  Made aware also-

## 2011-10-16 NOTE — Anesthesia Procedure Notes (Addendum)
Procedures

## 2011-10-16 NOTE — Anesthesia Preprocedure Evaluation (Addendum)
Anesthesia Evaluation  Patient identified by MRN, date of birth, ID band Patient awake    Reviewed: Allergy & Precautions, H&P , NPO status , Patient's Chart, lab work & pertinent test results  History of Anesthesia Complications Negative for: history of anesthetic complications  Airway Mallampati: II TM Distance: >3 FB Neck ROM: Full    Dental  (+) Dental Advisory Given   Pulmonary asthma ,  breath sounds clear to auscultation  Pulmonary exam normal       Cardiovascular hypertension, Pt. on medications Rhythm:Regular Rate:Normal     Neuro/Psych negative neurological ROS  negative psych ROS   GI/Hepatic negative GI ROS, Neg liver ROS,   Endo/Other  negative endocrine ROS  Renal/GU negative Renal ROS     Musculoskeletal negative musculoskeletal ROS (+)   Abdominal   Peds  Hematology negative hematology ROS (+)   Anesthesia Other Findings   Reproductive/Obstetrics                        Anesthesia Physical Anesthesia Plan  ASA: III  Anesthesia Plan: General   Post-op Pain Management:    Induction: Intravenous and Rapid sequence  Airway Management Planned: Oral ETT  Additional Equipment:   Intra-op Plan:   Post-operative Plan: Extubation in OR  Informed Consent: I have reviewed the patients History and Physical, chart, labs and discussed the procedure including the risks, benefits and alternatives for the proposed anesthesia with the patient or authorized representative who has indicated his/her understanding and acceptance.   Dental advisory given  Plan Discussed with: CRNA and Surgeon  Anesthesia Plan Comments:        Anesthesia Quick Evaluation

## 2011-10-16 NOTE — Transfer of Care (Signed)
Immediate Anesthesia Transfer of Care Note  Patient: April Graves  Procedure(s) Performed: Procedure(s) (LRB) with comments: CLOSED MANIPULATION KNEE () - Proxmial Fibula  Patient Location: PACU  Anesthesia Type: General  Level of Consciousness: awake, alert  and oriented  Airway & Oxygen Therapy: Patient Spontanous Breathing and Patient connected to face mask oxygen  Post-op Assessment: Report given to PACU RN and Post -op Vital signs reviewed and stable  Post vital signs: Reviewed and stable  Complications: No apparent anesthesia complications

## 2011-10-16 NOTE — Addendum Note (Signed)
Addendum  created 10/16/11 1117 by Gaylan Gerold, MD   Modules edited:Orders

## 2011-10-17 ENCOUNTER — Inpatient Hospital Stay (HOSPITAL_COMMUNITY): Payer: Medicare Other

## 2011-10-17 ENCOUNTER — Encounter (HOSPITAL_COMMUNITY): Payer: Self-pay | Admitting: Orthopedic Surgery

## 2011-10-17 DIAGNOSIS — I10 Essential (primary) hypertension: Secondary | ICD-10-CM

## 2011-10-17 LAB — BASIC METABOLIC PANEL
BUN: 6 mg/dL (ref 6–23)
Calcium: 9.5 mg/dL (ref 8.4–10.5)
Creatinine, Ser: 0.41 mg/dL — ABNORMAL LOW (ref 0.50–1.10)
GFR calc non Af Amer: >90 mL/min (ref >90)
Glucose, Bld: 108 mg/dL — ABNORMAL HIGH (ref 70–99)
Sodium: 133 mEq/L — ABNORMAL LOW (ref 135–145)

## 2011-10-17 LAB — CBC
MCH: 22.2 pg — ABNORMAL LOW (ref 26.0–34.0)
MCHC: 32.1 g/dL (ref 30.0–36.0)
MCV: 69.3 fL — ABNORMAL LOW (ref 78.0–100.0)
Platelets: 461 10*3/uL — ABNORMAL HIGH (ref 150–400)
RBC: 4.23 MIL/uL (ref 3.87–5.11)
RDW: 17.9 % — ABNORMAL HIGH (ref 11.5–15.5)

## 2011-10-17 LAB — MAGNESIUM: Magnesium: 2 mg/dL (ref 1.5–2.5)

## 2011-10-17 MED ORDER — METOPROLOL TARTRATE 25 MG PO TABS
25.0000 mg | ORAL_TABLET | Freq: Two times a day (BID) | ORAL | Status: DC
  Administered 2011-10-18 (×2): 25 mg via ORAL
  Filled 2011-10-17 (×3): qty 1

## 2011-10-17 MED ORDER — POTASSIUM CHLORIDE CRYS ER 20 MEQ PO TBCR
40.0000 meq | EXTENDED_RELEASE_TABLET | Freq: Once | ORAL | Status: AC
  Administered 2011-10-17: 40 meq via ORAL
  Filled 2011-10-17: qty 2

## 2011-10-17 MED ORDER — METOPROLOL TARTRATE 25 MG PO TABS
25.0000 mg | ORAL_TABLET | Freq: Two times a day (BID) | ORAL | Status: DC
  Filled 2011-10-17: qty 1

## 2011-10-17 MED ORDER — LISINOPRIL 20 MG PO TABS
20.0000 mg | ORAL_TABLET | Freq: Every day | ORAL | Status: DC
  Filled 2011-10-17: qty 1

## 2011-10-17 MED ORDER — HYDROCHLOROTHIAZIDE 25 MG PO TABS
25.0000 mg | ORAL_TABLET | Freq: Every day | ORAL | Status: DC
  Administered 2011-10-17: 25 mg via ORAL
  Filled 2011-10-17 (×2): qty 1

## 2011-10-17 MED ORDER — METOPROLOL TARTRATE 25 MG PO TABS
25.0000 mg | ORAL_TABLET | Freq: Once | ORAL | Status: AC
  Administered 2011-10-17: 25 mg via ORAL
  Filled 2011-10-17: qty 1

## 2011-10-17 MED ORDER — MUSCLE RUB 10-15 % EX CREA
TOPICAL_CREAM | CUTANEOUS | Status: DC | PRN
  Administered 2011-10-18: 1 via TOPICAL
  Filled 2011-10-17: qty 85

## 2011-10-17 NOTE — Op Note (Signed)
NAMESARAIAH, BHAT NO.:  [id number]  MEDICAL RECORD NO.:  [id number]  LOCATION:  1442                         FACILITY:  [facility]  PHYSICIAN:  Madlyn Frankel. Charlann Boxer, M.D.  DATE OF BIRTH:  1955/05/24  DATE OF PROCEDURE:  10/16/2011 DATE OF DISCHARGE:                              OPERATIVE REPORT   PREOPERATIVE DIAGNOSIS:  Left tibial plateau fracture.  POSTOPERATIVE DIAGNOSIS: 1. Left proximal fibular fracture. 2. Diffuse osteopenia versus osteoporosis. 3. Osteoarthritis left knee, with flexion contracture.  PROCEDURE: 1. Examination of left knee under anesthesia with no findings for     proximal tibial bone instability and only proximal fibular fracture     identified. 2. Closed reduction of left proximal fibula fracture, application of     knee immobilizers. 3. Examination under anesthesia right with findings of osteoarthritis.     No bony instability. 4. Placement of a knee immobilizer, right knee for arthritic     management.  SURGEON:  Madlyn Frankel. Charlann Boxer, M.D.  ASSISTANT:  Lanney Gins, PA-C.  ANESTHESIA:  General.  SPECIMENS:  None.  COMPLICATION:  None.  INDICATION FOR PROCEDURE:  Ms. Mckiver is a 56 year old female with diffuse loss of functional activity, strength as well as bone mineralization.  She was seen in the emergency room and diagnosed initially with tibial plateau fracture.  CT scan revealed that the fracture may not be as prominent, however, she had a proximal fibular fracture with persistent pain.  I reviewed with Ms. Cundy and daughter her current situation and recommended to go to the operating room with planned open reduction and internal fixation, if necessary versus closed treatment.  Risks and benefits were discussed and reviewed and consent was obtained.  PROCEDURE IN DETAIL:  The patient was brought to operative theater. Once the patient was positioned with a thigh tourniquet adequate anesthesia was administered.  A  time-out was performed identifying the patient, planned procedure, and extremity.  I had a chance to review the plain films and CT scan again.  At this point, under fluoroscopic imaging, I examined her left knee, with stress radiographs with varus valgus loading and to identify no evidence of any bony instability.  She is noted have advanced degenerative changes in both her knees.  She had at least 5-10 degree flexion contracture bilaterally.  Of both knees were examined.  We would only draped out the left knee. The right knee was examined under anesthesia with radiographs in AP and lateral planes, confirming only arthritic changes and diffuse osteopenic changes.  Once, I performed this exam and she was gently woken from her anesthetic.  Both knees were placed in knee mobilizer.  I reviewed these findings with her daughter at this point. Postoperatively, Mr. Fredrik Cove will be admitted back before she would seen and evaluated by physical therapy and she will able to perform weightbearing as tolerated to activity.  We will be arrange to perform bilaterally cortisone injection as it was not consented for this at this point.  We will arrange for this on the floor either tonight or tomorrow.  She will followup in the office on an as-needed basis, at that point, for management.  We will try to arrange  for home physical therapy versus outpatient therapy versus SNIFF.  Questions were encouraged in a note on this recent Single acute.     Madlyn Frankel Charlann Boxer, M.D.     MDO/MEDQ  D:  10/16/2011  T:  10/17/2011  Job:  [id number]

## 2011-10-17 NOTE — Progress Notes (Signed)
Per pt request, knee immobilizers removed even though Pt educated on the order notes that state to remove for ROM for PT.

## 2011-10-17 NOTE — Progress Notes (Signed)
Physical Therapy Treatment Patient Details Name: April Graves MRN: [id number] DOB: 25-Jun-1955 Today's Date: 10/17/2011 Time: 1478-2956 PT Time Calculation (min): 40 min  PT Assessment / Plan / Recommendation Comments on Treatment Session  Pt continues w/ weakness of body not expected for injury sustained. Recommend CIR consult. Discussed case w/ Dr. Janee Morn.     Follow Up Recommendations  Inpatient Rehab    Barriers to Discharge        Equipment Recommendations  Rolling walker with 5" wheels;Wheelchair (measurements)    Recommendations for Other Services Rehab consult;OT consult  Frequency Min 5X/week   Plan Discharge plan remains appropriate;Frequency remains appropriate    Precautions / Restrictions Precautions Precautions: Fall Required Braces or Orthoses: Knee Immobilizer - Left (for comfort.)   Pertinent Vitals/Pain Reports Lower L leg is getting more painful. Marland Kitchen Has been medicated. L knee and lower leg edematous. Appears to have slight increase in active L ankle dorsiflex.    Mobility  Bed Mobility Bed Mobility: Sit to Supine Sit to Supine: 1: +2 Total assist Sit to Supine: Patient Percentage: 20% Details for Bed Mobility Assistance: Pysical A needed for both LEs and trunk. Transfers Transfers: Sit to Stand;Stand to Sit;Stand Pivot Transfers Sit to Stand: 1: +2 Total assist;With upper extremity assist;From chair/3-in-1 Sit to Stand: Patient Percentage: 20% Stand to Sit: 1: +2 Total assist;To chair/3-in-1;Without upper extremity assist;To bed Stand to Sit: Patient Percentage: 20% Stand Pivot Transfers: 1: +2 Total assist Stand Pivot Transfers: Patient Percentage: 20% Details for Transfer Assistance: Pt attempted standing art RW but unablwe to get trunk erect enough. Pt. was unable to use LUE for support and min support of RUE on RW, Unable to tuck in buttocks. Pt. was assisted via 2 man arm lift to pivot bacjk onto bed.     Exercises     PT Diagnosis:    PT  Problem List:   PT Treatment Interventions:     PT Goals Acute Rehab PT Goals Pt will go Sit to Supine/Side: with supervision PT Goal: Sit to Supine/Side - Progress: Progressing toward goal Pt will go Sit to Stand: with min assist PT Goal: Sit to Stand - Progress: Progressing toward goal Pt will go Stand to Sit: with supervision PT Goal: Stand to Sit - Progress: Progressing toward goal Pt will Transfer Bed to Chair/Chair to Bed: with min assist PT Transfer Goal: Bed to Chair/Chair to Bed - Progress: Progressing toward goal  Visit Information  Last PT Received On: 10/17/11 Assistance Needed: +2 PT/OT Co-Evaluation/Treatment: Yes    Subjective Data  Subjective: There is something wrong w/ my L foot.    Cognition  Overall Cognitive Status: Difficult to assess Arousal/Alertness: Awake/alert Orientation Level: Appears intact for tasks assessed Behavior During Session: [facility] for tasks performed    Balance  Static Sitting Balance Static Sitting - Level of Assistance: 4: Min assist Static Sitting - Comment/# of Minutes: Pt leans heavily to the left and has difficulty bringing trunk to midline position.  End of Session PT - End of Session Equipment Utilized During Treatment: Gait belt Activity Tolerance: Patient limited by fatigue Patient left: in bed;with call bell/phone within reach;with bed alarm set;with family/visitor present Nurse Communication: Mobility status;Need for lift equipment   GP     Rada Hay 10/17/2011, 4:53 PM

## 2011-10-17 NOTE — Evaluation (Signed)
Physical Therapy Evaluation Patient Details Name: NONA GRACEY MRN: [id number] DOB: January 17, 1956 Today's Date: 10/17/2011 Time: 2130-8657 PT Time Calculation (min): 59 min  PT Assessment / Plan / Recommendation Clinical Impression  Pt. was admitted 10/14/11 after fall on steps when her legs gave per chart. Pt is s/p closed reduction of L proximal fibula FX on 9/ 10. Ortho found no L tibial plateau fx. that required ORIF. KI ordered for bil. Knees. Spoke w/ Ortho PA who stated ok to ambulate without eithwer KI. WBAT L also. Pt presents w/ decreased UE and  LE strength, Left appears weaker. Also, has pain of L shoulder and weakness there also. Daughter reoports pt has not been able to feed herself since fall down steps.  Pt also appears to have decreased  dynamic balance.. Daughter states pt. was Independent PTA, able to negotiate flight of steps and drove. Daughter states that pt. speech does not sound differently. Pt. is slow to follow commands and requires repetition of instruction and explantions several times.  Pt. will benefit from PT to improve functional mobility and continue to evaluate motor status. Pt. may benefit from CIR to return to functional level to DC to home. Pt. has 24/7 caregivers.7Pt is noted to have high BP today during RX. RN aware. 195/107 pre and during 198/107. HR 107    PT Assessment  Patient needs continued PT services    Follow Up Recommendations  Inpatient Rehab    Barriers to Discharge        Equipment Recommendations  Rolling walker with 5" wheels;Wheelchair (measurements);3 in 1 bedside comode    Recommendations for Other Services OT consult;Rehab consult   Frequency Min 5X/week    Precautions / Restrictions Precautions Precautions: Fall Required Braces or Orthoses: Knee Immobilizer - Left and right Knee Immobilizer - Left:  (per  ortho, ok to not use either, for comfort.)   Pertinent Vitals/Pain Pre BP 195/107 during 183/ 107; sats 955 RA, HR 105 RN  aware.      Mobility  Bed Mobility Bed Mobility: Supine to Sit Supine to Sit: 2: Max assist;HOB elevated Details for Bed Mobility Assistance: HOB raised to max height., TC and assist for moving to upright position. Pt was unable to push w/  LUE. Transfers Transfers: Sit to Stand;Stand to Sit;Not assessed Transfer via Lift Equipment: Kandee Keen Details for Transfer Assistance: Pt. was deemed no strong enough in LE and UE to stand at [facility]. Sara+ used and L KI, had to maneuvre L leg as stood up. Pt. had difficulty standing erect in sara+. moved to recliner via lift. Ambulation/Gait Ambulation/Gait Assistance: Not tested (comment)    Exercises     PT Diagnosis: Difficulty walking;Generalized weakness;Acute pain  PT Problem List: Decreased strength;Decreased range of motion;Decreased activity tolerance;Decreased balance;Decreased mobility;Decreased coordination;Decreased cognition;Decreased knowledge of use of DME;Decreased safety awareness;Decreased knowledge of precautions;Pain;Cardiopulmonary status limiting activity;Impaired sensation PT Treatment Interventions: DME instruction;Gait training;Functional mobility training;Therapeutic activities;Therapeutic exercise;Balance training;Patient/family education   PT Goals Acute Rehab PT Goals PT Goal Formulation: With patient/family Time For Goal Achievement: 10/31/11 Potential to Achieve Goals: Good Pt will go Supine/Side to Sit: with supervision PT Goal: Supine/Side to Sit - Progress: Goal set today Pt will go Sit to Supine/Side: with supervision PT Goal: Sit to Supine/Side - Progress: Goal set today Pt will go Sit to Stand: with min assist PT Goal: Sit to Stand - Progress: Goal set today Pt will go Stand to Sit: with supervision PT Goal: Stand to Sit - Progress: Goal set  today Pt will Transfer Bed to Chair/Chair to Bed: with min assist PT Transfer Goal: Bed to Chair/Chair to Bed - Progress: Goal set today Pt will Ambulate: 16 - 50  feet;with min assist;with least restrictive assistive device PT Goal: Ambulate - Progress: Goal set today  Visit Information  Last PT Received On: 10/17/11 Assistance Needed: +2    Subjective Data  Subjective: I am having difficulty moving . Patient Stated Goal: to walk   Prior Functioning  Home Living Lives With: Family;Daughter;Son Available Help at Discharge: Family Type of Home: House Home Access: Stairs to enter Entrance Stairs-Number of Steps: 1 Home Layout: Two level;Full bath on main level Alternate Level Stairs-Number of Steps: Pt/daughter state can have bed on first floor. Alternate Level Stairs-Rails: Left Bathroom Shower/Tub: Engineer, manufacturing systems: Standard Home Adaptive Equipment: None Prior Function Level of Independence: Independent Able to Take Stairs?: No Driving: Yes Vocation: Retired    IT consultant  Overall Cognitive Status: Difficult to assess Difficult to assess due to: Other (comment) (speech appears slightly slurred, daughter states sounds norm) Arousal/Alertness: Awake/alert Cognition - Other Comments: Pt. appears delayed in following commands and understanding explantions about current situation.  daughter does not acknowledge a change in pt. Explanations have to stated multiple times.    Extremity/Trunk Assessment Right Upper Extremity Assessment RUE ROM/Strength/Tone: Deficits RUE ROM/Strength/Tone Deficits: Has difficulty reaching to top of head but is able although movement is slowed, able to take cup and drink from it. able to touch thumb to each finger tip, slower than expected. Takes mod. resistance to shoulder extension  RUE Sensation: Deficits RUE Sensation Deficits: reports lateral fingers feel a little numb. Left Upper Extremity Assessment LUE ROM/Strength/Tone: Deficits LUE ROM/Strength/Tone Deficits: unable to take hand higher than mough, gives to resistance for extension, pt reports shoulder pain, ?if gives due to pain. able to  touch thumb to each finger tip, slower than  r. LUE Sensation: WFL - Light Touch Right Lower Extremity Assessment RLE ROM/Strength/Tone: Deficits RLE ROM/Strength/Tone Deficits: knee ext./hip flex 3+/5. Dorsiflexion 4/5 RLE Sensation: Deficits RLE Sensation Deficits: diminished medial foot-- although some responses inconsistent RLE Coordination: Deficits RLE Coordination Deficits: slow RRM of ankle and knee. Left Lower Extremity Assessment LLE ROM/Strength/Tone: Deficits LLE ROM/Strength/Tone Deficits: knee ext 2+, could be asso. w/ pain.  dorsiflex 2/5 decreased eversion noted. LLE Sensation: Deficits LLE Sensation Deficits: pt. stated felt like electricity in her leg and foot. LLE Coordination: Deficits   Balance Balance Balance Assessed: Yes Static Sitting Balance Static Sitting - Balance Support: Feet supported;Bilateral upper extremity supported Static Sitting - Level of Assistance: 4: Min assist;5: Stand by assistance Static Sitting - Comment/# of Minutes: Initially pt required steady assist.Pt. able to sit w/ stand by. When pt leaned to L ,lost balance and fell to bed that had HOB still at max level. Pt. had difficulty righting self.  End of Session PT - End of Session Equipment Utilized During Treatment: Left knee immobilizer Activity Tolerance: Patient limited by fatigue;Treatment limited secondary to medical complications (Comment) (significant weakness of UE/LE's) Patient left: in chair;with call bell/phone within reach;with chair alarm set;with family/visitor present Nurse Communication: Mobility status;Need for lift equipment  GP     Rada Hay 10/17/2011, 11:53 AM  [phone]

## 2011-10-17 NOTE — Progress Notes (Signed)
[facility] PROGRESS NOTE  April Graves ZOX:[id number] DOB: [birth year] DOA: 10/14/2011 PCP: No primary provider on file.  Assessment/Plan: Principal Problem:  *Fibula upper end fracture Active Problems:  Hypokalemia  Anemia  Fall  Unable to walk  Weakness generalized  Muscle spasms of lower extremity  Arthritis of both knees  Osteoporosis  HTN (hypertension)  1. Left proximal fibula fracture - Per orthopaedic surgeon.  Patient is status post close reduction. Continue knee immobilizer. Pain management. PT OT. We'll consult with CIR to see whether patient is an appropriate candidate. Per orthopedics.  2. Hypokalemia - Will replace today.  May be secondary to poor oral intake.  3. Anemia - No active bleeding recently - Will continue to monitor and consider transfusion if patient's hemoglobin decreases past 7.0  4. Hypertension - May have a component of pain. Poorly controlled. Norvasc was increased to 10 mg yesterday. We'll place on Lopressor 25 mg by mouth twice a day. Will start HCTZ 25 mg daily. If no further improvement may consider increasing Lopressor or the Addition of the ACE inhibitor.  5 muscular pain/inability to lift left upper extremity Will get plain films of the left shoulder and left humerus. Pain management.  6. DVT prophylaxis - Per ortho   Code Status: full Family Communication: spoke with patient and daughter at bedside Disposition Plan: Pending clinical improvement. CIR vs SNF   Brief narrative: Please refer to HPI  Consultants:  Ortho : Dr. Charlann Boxer  Procedures Close reduction of left proximal fibula fracture and application of knee immobilizers 10/16/2011  Antibiotics:  None  HPI/Subjective: Patient complaining of diffuse muscle pain especially in the left shoulder and on. Region with decreased mobility and elevation of the left upper extremity. Patient also complaining of pain in the ankles.  Objective: Filed Vitals:   10/17/11  1230 10/17/11 1418 10/17/11 1502 10/17/11 1545  BP: 180/108 188/99 202/116 194/84  Pulse: 121 122    Temp: 98.8 F (37.1 C) 98.9 F (37.2 C)    TempSrc: Oral Oral    Resp: 20 20    Height:      Weight:      SpO2: 97% 95%      Intake/Output Summary (Last 24 hours) at 10/17/11 1636 Last data filed at 10/17/11 1300  Gross per 24 hour  Intake   1060 ml  Output   3800 ml  Net  -2740 ml   Filed Weights   10/14/11 1818 10/15/11 0048  Weight: 77.111 kg (170 lb) 68.7 kg (151 lb 7.3 oz)    Exam:   General:  Pt in NAD, A and O x 3  Cardiovascular: RRR, No MRG  Respiratory: CTA BL, no wheezes  Abdomen: Soft, NT  Extremities: Braces in place BL at lower extremities.  Data Reviewed: Basic Metabolic Panel:  Lab 10/17/11 8119 10/16/11 0458 10/15/11 0456 10/14/11 2020  NA 133* 131* 132* 134*  K 3.5 3.1* 3.6 2.7*  CL 99 96 95* 94*  CO2 26 26 27 29   GLUCOSE 108* 122* 124* 134*  BUN 6 8 8 9   CREATININE 0.41* 0.37* 0.38* 0.40*  CALCIUM 9.5 9.3 9.6 10.0  MG 2.0 -- 1.8 --  PHOS -- -- -- --   Liver Function Tests: No results found for this basename: AST:5,ALT:5,ALKPHOS:5,BILITOT:5,PROT:5,ALBUMIN:5 in the last 168 hours No results found for this basename: LIPASE:5,AMYLASE:5 in the last 168 hours No results found for this basename: AMMONIA:5 in the last 168 hours CBC:  Lab 10/17/11 0750 10/16/11 0458 10/15/11 0456  10/14/11 2020  WBC 8.3 9.6 10.2 13.9*  NEUTROABS -- -- -- 12.4*  HGB 9.4* 7.8* 8.1* 8.4*  HCT 29.3* 24.8* 26.5* 26.7*  MCV 69.3* 66.8* 67.6* 67.8*  PLT 461* 468* 506* 535*   Cardiac Enzymes: No results found for this basename: CKTOTAL:5,CKMB:5,CKMBINDEX:5,TROPONINI:5 in the last 168 hours BNP (last 3 results) No results found for this basename: PROBNP:3 in the last 8760 hours CBG: No results found for this basename: GLUCAP:5 in the last 168 hours  Recent Results (from the past 240 hour(s))  SURGICAL PCR SCREEN     Status: Abnormal   Collection Time    10/16/11  7:42 AM      Component Value Range Status Comment   MRSA, PCR NEGATIVE  NEGATIVE Final    Staphylococcus aureus POSITIVE (*) NEGATIVE Final      Studies: Ct Knee Left Wo Contrast  10/16/2011  *RADIOLOGY REPORT*  Clinical Data: Knee pain status post fall.  Evaluate for tibial plateau fracture.  CT OF THE LEFT KNEE WITHOUT CONTRAST  Technique:  Multidetector CT imaging was performed according to the standard protocol. Multiplanar CT image reconstructions were also generated.  Comparison: Radiographs 10/14/2011.  Findings: There is a moderate sized hemarthrosis.  No layering intra-articular fat is demonstrated.  There is osteopenia with tricompartmental degenerative change.  In the medial compartment, there is advanced joint space loss with subchondral cyst formation in the femoral condyle and tibial plateau.  As demonstrated radiographically, there is minimal articular surface irregularity of the medial tibial plateau peripherally.  This is favored to reflect degenerative change, although could reflect a small fracture. There is no depression of the articular surface.  There is a mildly displaced fracture of the fibular neck.  This demonstrates no extension into the proximal tibiofibular joint.  No other acute fractures are identified.  There are patellofemoral and lateral compartment degenerative changes as well.  An intramuscular lipoma is noted incidentally within the popliteus muscle.  There is mild generalized muscular atrophy surrounding the knee.  IMPRESSION:  1.  Mildly displaced fracture of the fibular neck. 2.  Peripheral articular surface irregularity of the medial tibial plateau is favored to reflect degenerative change, although could reflect a small fracture. 3.  No other acute osseous findings.  Moderate sized hemarthrosis. 4.  Tricompartmental degenerative changes with subchondral cyst formation.   Original Report Authenticated By: Gerrianne Scale, M.D.    Ct Knee Right Wo  Contrast  10/16/2011  *RADIOLOGY REPORT*  Clinical Data: Knee pain status post fall.  Evaluate for occult tibial plateau fracture.  CT OF THE RIGHT KNEE WITHOUT CONTRAST  Technique:  Multidetector CT imaging was performed according to the standard protocol. Multiplanar CT image reconstructions were also generated.  Comparison: Radiographs 10/14/2011.  Findings: There is a moderate sized hemarthrosis.  No layering intra-articular fat is demonstrated.  There is generalized osteopenia.  There are tricompartmental degenerative changes with joint space loss and subchondral cyst formation in both femoral condyles and tibial plateaus.  A dominant 2.1 cm subchondral cyst anteriorly in the lateral tibial plateau has sclerotic margins and is likely a geode.  No cortical fracture or depressed tibial plateau fracture is demonstrated.  There is no dislocation.  The extensor mechanism is intact.  As evaluated by CT, the cruciate ligaments appear intact.  IMPRESSION:  1.  No CT evidence of acute fracture or dislocation. 2.  Tricompartmental degenerative changes with subchondral cyst formation, most pronounced anteriorly in the lateral tibial plateau. 3.  Moderate sized hemarthrosis.  If the patient fails to respond to conservative therapy, follow-up MRI may be helpful to exclude occult fracture or internal derangement.   Original Report Authenticated By: Gerrianne Scale, M.D.    Dg C-arm 1-60 Min-no Report  10/16/2011  CLINICAL DATA: left Tibial Plateau fracture   C-ARM 1-60 MINUTES  Fluoroscopy was utilized by the requesting physician.  No radiographic  interpretation.     Dg Knee 2 Views Left  10/16/2011  *RADIOLOGY REPORT*  Clinical Data: Closed reduction of fibular fracture.  LEFT KNEE - 3 VIEW  Comparison: CT on 10/15/2011  Findings: Proximal fibular fracture is seen, and is seen in near anatomic alignment.  No other fractures identified.  IMPRESSION: Proximal fibular fracture in near anatomic alignment.   Original  Report Authenticated By: Danae Orleans, M.D.     Scheduled Meds:    . amLODipine  10 mg Oral Daily  . hydrALAZINE      . hydrochlorothiazide  25 mg Oral Daily  . metoprolol tartrate  25 mg Oral BID  . metoprolol tartrate  25 mg Oral Once  . potassium chloride  20 mEq Oral Once  . potassium chloride  40 mEq Oral Once  . DISCONTD: amLODipine  5 mg Oral Daily  . DISCONTD: lisinopril  20 mg Oral Daily  . DISCONTD: metoprolol tartrate  25 mg Oral BID   Continuous Infusions:    . DISCONTD: sodium chloride 75 mL/hr at 10/16/11 1150    Principal Problem:  *Fibula upper end fracture Active Problems:  Hypokalemia  Anemia  Fall  Unable to walk  Weakness generalized  Muscle spasms of lower extremity  Arthritis of both knees  Osteoporosis  HTN (hypertension)    Time spent: > 35 minutes    [facility]  [facility] Pager [phone]. If 8PM-8AM, please contact night-coverage at [email or web address], password [facility] 10/17/2011, 4:36 PM  LOS: 3 days

## 2011-10-17 NOTE — Evaluation (Signed)
Occupational Therapy Evaluation Patient Details Name: April Graves MRN: [id number] DOB: Sep 12, 1955 Today's Date: 10/17/2011 Time: 4098-1191 OT Time Calculation (min): 39 min  OT Assessment / Plan / Recommendation Clinical Impression  Pt presents with a closed reduction of proximal fibula s/p fall. Pt extremely weak and deconditioned in both UB and LB. Pt was fully independent prior to this incident. Skilled OT indicated to maximize independence with BADLs to min-mod A level in prep for d/c to next venue of care.    OT Assessment  Patient needs continued OT Services    Follow Up Recommendations  Inpatient Rehab;Skilled nursing facility    Barriers to Discharge Decreased caregiver support;Inaccessible home environment    Equipment Recommendations  Rolling walker with 5" wheels;Wheelchair (measurements);3 in 1 bedside comode    Recommendations for Other Services Rehab consult  Frequency  Min 2X/week    Precautions / Restrictions Precautions Precautions: Fall Required Braces or Orthoses: Knee Immobilizer - Left Knee Immobilizer - Left:  (per  ortho, ok to not use either, for comfort.)   Pertinent Vitals/Pain RN informed therapist that pt's HR reached 140s during mobilization    ADL  Grooming: Simulated;Set up Where Assessed - Grooming: Supported sitting Upper Body Bathing: Simulated;Maximal assistance Where Assessed - Upper Body Bathing: Unsupported sitting Lower Body Bathing: Simulated;+2 Total assistance Lower Body Bathing: Patient Percentage: 0% Where Assessed - Lower Body Bathing: Supported sit to stand Upper Body Dressing: Simulated;Maximal assistance Where Assessed - Upper Body Dressing: Unsupported sitting Lower Body Dressing: Simulated;+2 Total assistance Lower Body Dressing: Patient Percentage: 0% Where Assessed - Lower Body Dressing: Supported sit to stand Toilet Transfer: Simulated;+2 Total assistance Toilet Transfer: Patient Percentage: 10% Statistician  Method: Ambulance person: Other (comment) (BTB) Toileting - Clothing Manipulation and Hygiene: Simulated;+2 Total assistance Toileting - Architect and Hygiene: Patient Percentage: 0% Where Assessed - Toileting Clothing Manipulation and Hygiene: Standing ADL Comments: Pt extremely weak and deconditioned. Performed sit<>stand x2 before returning pt BTB.    OT Diagnosis: Generalized weakness  OT Problem List: Decreased strength;Cardiopulmonary status limiting activity;Decreased safety awareness;Decreased activity tolerance;Decreased knowledge of use of DME or AE;Impaired UE functional use;Pain;Impaired balance (sitting and/or standing) OT Treatment Interventions: Self-care/ADL training;Therapeutic activities;Therapeutic exercise;DME and/or AE instruction;Patient/family education;Balance training   OT Goals Acute Rehab OT Goals OT Goal Formulation: With patient/family Potential to Achieve Goals: Good ADL Goals Pt Will Perform Grooming: with set-up;Sitting, chair;Supine, head of bed up;Supported ADL Goal: Grooming - Progress: Goal set today Pt Will Transfer to Toilet: with mod assist;Squat pivot transfer;Stand pivot transfer;3-in-1 ADL Goal: Toilet Transfer - Progress: Goal set today Pt Will Perform Toileting - Clothing Manipulation: with mod assist;Sitting on 3-in-1 or toilet;Standing ADL Goal: Toileting - Clothing Manipulation - Progress: Goal set today Pt Will Perform Toileting - Hygiene: with mod assist;Sit to stand from 3-in-1/toilet ADL Goal: Toileting - Hygiene - Progress: Goal set today Arm Goals Pt Will Complete Theraband Exer: with supervision, verbal cues required/provided;Bilateral upper extremities;1 set;10 reps;Level 1 Theraband Arm Goal: Theraband Exercises - Progress: Goal set today Miscellaneous OT Goals Miscellaneous OT Goal #1: Pt will complete supine<>sit with min A and tolerate 20 mins of TA in prep for ADLs. OT Goal: Miscellaneous Goal #1  - Progress: Goal set today Miscellaneous OT Goal #2: Pt will sit unsupported EOB x8 min with SBA in prep for seated aDL. OT Goal: Miscellaneous Goal #2 - Progress: Goal set today  Visit Information  Last OT Received On: 10/17/11 Assistance Needed: +2 PT/OT Co-Evaluation/Treatment: Yes  Subjective Data  Subjective: Don't let me fall.... Patient Stated Goal: Walk   Prior Functioning  Vision/Perception  Home Living Lives With: Family;Daughter;Son Available Help at Discharge: Family Type of Home: House Home Access: Stairs to enter Entrance Stairs-Number of Steps: 1 Home Layout: Two level;Full bath on main level Alternate Level Stairs-Number of Steps: Pt/daughter state can have bed on first floor. Alternate Level Stairs-Rails: Left Bathroom Shower/Tub: Engineer, manufacturing systems: Standard Home Adaptive Equipment: None Prior Function Level of Independence: Independent Able to Take Stairs?: No Driving: Yes Vocation: Retired Musician: No difficulties Dominant Hand: Right   Vision - Assessment Vision Assessment: Vision tested Tracking/Visual Pursuits: Able to track stimulus in all quads without difficulty Saccades: Within functional limits Convergence: Within functional limits  Cognition  Overall Cognitive Status: Difficult to assess Difficult to assess due to: Other (comment) (speech appears slightly slurred, daughter states sounds norm) Arousal/Alertness: Awake/alert Cognition - Other Comments: Pt. appears delayed in following commands and understanding explantions about current situation.  daughter does not acknowledge a change in pt. Explanations have to stated multiple times.    Extremity/Trunk Assessment Right Upper Extremity Assessment RUE ROM/Strength/Tone: Deficits RUE ROM/Strength/Tone Deficits: 3+/5 grossly, delayed thumb to finger opposition RUE Sensation: Deficits RUE Sensation Deficits: reports lateral fingers feel a little  numb. Left Upper Extremity Assessment LUE ROM/Strength/Tone: Deficits LUE ROM/Strength/Tone Deficits: Unable to elevate shoulder >~45 degrees. Elbow and distally 3/5. Pt unable to use extremity to assist with RW when attempting to stand. LUE Sensation: WFL - Light Touch Right Lower Extremity Assessment RLE ROM/Strength/Tone: Deficits RLE ROM/Strength/Tone Deficits: knee ext./hip flex 3+/5. Dorsiflexion 4/5 RLE Sensation: Deficits RLE Sensation Deficits: diminished medial foot-- although some responses inconsistent RLE Coordination: Deficits RLE Coordination Deficits: slow RRM of ankle and knee. Left Lower Extremity Assessment LLE ROM/Strength/Tone: Deficits LLE ROM/Strength/Tone Deficits: knee ext 2+, could be asso. w/ pain.  dorsiflex 2/5 decreased eversion noted. LLE Sensation: Deficits LLE Sensation Deficits: pt. stated felt like electricity in her leg and foot. LLE Coordination: Deficits   Mobility  Shoulder Instructions  Bed Mobility Bed Mobility: Sit to Supine Supine to Sit: 2: Max assist;HOB elevated Sit to Supine: 1: +2 Total assist Sit to Supine: Patient Percentage: 20% Details for Bed Mobility Assistance: Pysical A needed for both LEs and trunk. Transfers Transfers: Sit to Stand;Stand to Sit Sit to Stand: 1: +2 Total assist;With upper extremity assist;From chair/3-in-1 Sit to Stand: Patient Percentage: 20% Stand to Sit: 1: +2 Total assist;To chair/3-in-1;Without upper extremity assist;To bed Stand to Sit: Patient Percentage: 20% Transfer via Lift Equipment: Kandee Keen Details for Transfer Assistance: Attempted sit<>stand x2. Pt stands with a flexed posture. Max cues to extend trunk. Stood for ~1 minute max.       Exercise     Balance Balance Balance Assessed: Yes Static Sitting Balance Static Sitting - Balance Support: Feet supported;Bilateral upper extremity supported Static Sitting - Level of Assistance: 4: Min assist Static Sitting - Comment/# of Minutes: Pt  leans heavily to the left and has difficulty bringing trunk to midline position.   End of Session OT - End of Session Equipment Utilized During Treatment: Gait belt;Right knee immobilizer Activity Tolerance: Patient limited by fatigue Patient left: in bed;with call bell/phone within reach;with family/visitor present  GO     Danni Leabo A OTR/L [phone] 10/17/2011, 3:09 PM

## 2011-10-18 DIAGNOSIS — S82109A Unspecified fracture of upper end of unspecified tibia, initial encounter for closed fracture: Secondary | ICD-10-CM

## 2011-10-18 LAB — BASIC METABOLIC PANEL
CO2: 26 mEq/L (ref 19–32)
Calcium: 9.7 mg/dL (ref 8.4–10.5)
Glucose, Bld: 119 mg/dL — ABNORMAL HIGH (ref 70–99)
Potassium: 3.6 mEq/L (ref 3.5–5.1)
Sodium: 131 mEq/L — ABNORMAL LOW (ref 135–145)

## 2011-10-18 LAB — CBC
Hemoglobin: 9.9 g/dL — ABNORMAL LOW (ref 12.0–15.0)
MCH: 22 pg — ABNORMAL LOW (ref 26.0–34.0)
MCV: 69.4 fL — ABNORMAL LOW (ref 78.0–100.0)
Platelets: 499 10*3/uL — ABNORMAL HIGH (ref 150–400)
RBC: 4.51 MIL/uL (ref 3.87–5.11)
WBC: 9.4 10*3/uL (ref 4.0–10.5)

## 2011-10-18 MED ORDER — LIDOCAINE HCL 1 % IJ SOLN
INTRAMUSCULAR | Status: AC
  Filled 2011-10-18: qty 20

## 2011-10-18 MED ORDER — HYDROCODONE-ACETAMINOPHEN 5-325 MG PO TABS: 1.0000 | ORAL_TABLET | ORAL | Status: DC | PRN

## 2011-10-18 MED ORDER — METHYLPREDNISOLONE ACETATE 80 MG/ML IJ SUSP
160.0000 mg | Freq: Once | INTRAMUSCULAR | Status: DC
  Filled 2011-10-18: qty 2

## 2011-10-18 MED ORDER — LISINOPRIL 40 MG PO TABS
40.0000 mg | ORAL_TABLET | Freq: Every day | ORAL | Status: DC
  Administered 2011-10-19: 40 mg via ORAL
  Filled 2011-10-18: qty 1

## 2011-10-18 MED ORDER — LISINOPRIL 20 MG PO TABS
20.0000 mg | ORAL_TABLET | Freq: Every day | ORAL | Status: DC
  Administered 2011-10-18: 20 mg via ORAL
  Filled 2011-10-18: qty 1

## 2011-10-18 MED ORDER — METOPROLOL TARTRATE 50 MG PO TABS
50.0000 mg | ORAL_TABLET | Freq: Two times a day (BID) | ORAL | Status: DC
  Administered 2011-10-18: 50 mg via ORAL
  Filled 2011-10-18 (×3): qty 1

## 2011-10-18 MED ORDER — CYCLOBENZAPRINE HCL 10 MG PO TABS: 10.0000 mg | ORAL_TABLET | Freq: Three times a day (TID) | ORAL | Status: DC | PRN

## 2011-10-18 NOTE — Progress Notes (Signed)
Physical Therapy Treatment Patient Details Name: April Graves MRN: [id number] DOB: 1955/08/27 Today's Date: 10/18/2011 Time: 7829-5621 PT Time Calculation (min): 60 min  PT Assessment / Plan / Recommendation Comments on Treatment Session  Pt. continues to process slowly, has random complaints aboput L ankle, L knee. Did not want to use L KI. Dr. Charlann Boxer stated OK Pt. continues to require wxtensive assistance and frequent redirection for activity. Pt. ' BP remains elevated. L shoulder also continues w/ decreased active movement but appears improved. Xray of L humerus neg. for fracture. Daughter present. continue to recommend CIR as pt is requiring extensive asssistance.    Follow Up Recommendations  Inpatient Rehab    Barriers to Discharge        Equipment Recommendations  Rolling walker with 5" wheels;3 in 1 bedside comode;Wheelchair (measurements)    Recommendations for Other Services Rehab consult;OT consult  Frequency Min 5X/week   Plan Discharge plan remains appropriate;Frequency remains appropriate    Precautions / Restrictions Precautions Precautions: Fall Precaution Comments: HYPERTENSION.MONITOR BP ; encourage knee extension. Required Braces or Orthoses: Knee Immobilizer - Left;Other Brace/Splint (Removed per pt. request. Dr. Charlann Boxer in and stated OK to not use) Other Brace/Splint: ASO ordered. for L ankle.   Pertinent Vitals/Pain BP pre 183/108 HR 100 during 185/11. RN aware.    Mobility  Bed Mobility Bed Mobility: Supine to Sit Supine to Sit: 3: Mod assist Transfers Transfers: Sit to Stand;Stand to Sit;Stand Pivot Transfers Sit to Stand: 1: +2 Total assist;From elevated surface;From chair/3-in-1;With upper extremity assist Sit to Stand: Patient Percentage: 30% Stand to Sit: 3: Mod assist;To chair/3-in-1 (assist to step LLE out as KI  caused foot to stick.) Stand Pivot Transfers: 1: +2 Total assist Stand Pivot Transfers: Patient Percentage: 40% Details for Transfer  Assistance: Pt. had KI on from bed to [facility], removed for BSC to recliner. Mutimodal cue to stand errect. pt. tends to look down. Also has decreased grip on each side of RW. Does not use UE's well. L knee did buckle some when KI removed.    Exercises General Exercises - Lower Extremity Ankle Circles/Pumps: AAROM;Strengthening;Both Long Arc Quad: AROM;Strengthening;Both Heel Slides: AROM;Strengthening;Both Straight Leg Raises: AAROM;Both   PT Diagnosis:    PT Problem List:   PT Treatment Interventions:     PT Goals Acute Rehab PT Goals Pt will go Supine/Side to Sit: with supervision PT Goal: Supine/Side to Sit - Progress: Progressing toward goal Pt will go Sit to Stand: with min assist PT Goal: Sit to Stand - Progress: Progressing toward goal Pt will go Stand to Sit: with min assist PT Goal: Stand to Sit - Progress: Progressing toward goal Pt will Transfer Bed to Chair/Chair to Bed: with min assist PT Transfer Goal: Bed to Chair/Chair to Bed - Progress: Progressing toward goal  Visit Information  Last PT Received On: 10/18/11 Assistance Needed: +2    Subjective Data  Subjective: I need a brace for my ankle., I don't like thaT LONG BRACE.   Cognition  Overall Cognitive Status: Difficult to assess Arousal/Alertness: Awake/alert Orientation Level: Appears intact for tasks assessed Behavior During Session: Anxious Cognition - Other Comments: continues to ask  questions over and over, has difficulty  processing.    Balance  Static Sitting Balance Static Sitting - Balance Support: Bilateral upper extremity supported;Feet supported Static Sitting - Comment/# of Minutes: Pt still lists to L.   End of Session PT - End of Session Equipment Utilized During Treatment: Gait belt;Left knee immobilizer Activity Tolerance: Patient  limited by fatigue;Patient limited by pain Patient left: in chair;with call bell/phone within reach;with chair alarm set;with family/visitor present Nurse  Communication: Mobility status;Need for lift equipment (stedy now that pt. does not want KI.)   GP     Rada Hay 10/18/2011, 11:06 AM
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
## 2011-10-18 NOTE — Progress Notes (Signed)
[facility] PROGRESS NOTE  April Graves JXB:[id number] DOB: 1955/04/07 DOA: 10/14/2011 PCP: No primary provider on file.  Assessment/Plan: Principal Problem:  *Fibula upper end fracture Active Problems:  Hypokalemia  Anemia  Fall  Unable to walk  Weakness generalized  Muscle spasms of lower extremity  Arthritis of both knees  Osteoporosis  HTN (hypertension)  Left ankle sprain  1. Left proximal fibula fracture - Per orthopaedic surgeon.  Patient is status post close reduction. Continue knee immobilizer. Pain management. PT OT. Per CIR, patient is an appropriate candidate. Awaiting insurance approval. Will need to followup with orthopedics 4 weeks post discharge. Per orthopedics.  2. Hypokalemia -  May be secondary to poor oral intake vs diuretics. Repleted.  3. iron deficiency Anemia - No active bleeding recently - Will continue to monitor and consider transfusion if patient's hemoglobin decreases past 7.0. Hemoglobin is stable at 9.9. Will start on Nu iron. Follow.  4. Hypertension - May have a component of pain. Poorly controlled. Norvasc was increased to 10 mg yesterday. Continue  Lopressor 25 mg by mouth twice a day. Will DC HCTZ as patient is complaining of some dizziness and start patient on lisinopril 20 mg daily and up titrate to 40 mg daily for better blood pressure control.  5 muscular pain/inability to lift left upper extremity X-rays negative. Pain management.  6. DVT prophylaxis - Per ortho   Code Status: full Family Communication: spoke with patient and daughter at bedside Disposition Plan: Pending clinical improvement. CIR pending insurance approval   Brief narrative: Please refer to HPI  Consultants:  Ortho : Dr. Charlann Boxer  Procedures Close reduction of left proximal fibula fracture and application of knee immobilizers 10/16/2011  Antibiotics:  None  HPI/Subjective: Patient states felt dizzy after starting BP medications. Patient states  left upper extremity less painful and able to raise him better.  Objective: Filed Vitals:   10/18/11 0414 10/18/11 1330 10/18/11 1400 10/18/11 1545  BP: 168/94 179/92 176/102 174/103  Pulse: 89   102  Temp: 98.8 F (37.1 C)   99.6 F (37.6 C)  TempSrc: Oral   Oral  Resp: 20   18  Height:      Weight:      SpO2: 95%   96%    Intake/Output Summary (Last 24 hours) at 10/18/11 1812 Last data filed at 10/18/11 1800  Gross per 24 hour  Intake    560 ml  Output    570 ml  Net    -10 ml   Filed Weights   10/14/11 1818 10/15/11 0048  Weight: 77.111 kg (170 lb) 68.7 kg (151 lb 7.3 oz)    Exam:   General:  Pt in NAD, A and O x 3  Cardiovascular: RRR, No MRG  Respiratory: CTA BL, no wheezes  Abdomen: Soft, NT  Extremities: Braces in place BL at lower extremities.  Data Reviewed: Basic Metabolic Panel:  Lab 10/18/11 1308 10/17/11 0750 10/16/11 0458 10/15/11 0456 10/14/11 2020  NA 131* 133* 131* 132* 134*  K 3.6 3.5 3.1* 3.6 2.7*  CL 96 99 96 95* 94*  CO2 26 26 26 27 29   GLUCOSE 119* 108* 122* 124* 134*  BUN 7 6 8 8 9   CREATININE 0.43* 0.41* 0.37* 0.38* 0.40*  CALCIUM 9.7 9.5 9.3 9.6 10.0  MG -- 2.0 -- 1.8 --  PHOS -- -- -- -- --   Liver Function Tests: No results found for this basename: AST:5,ALT:5,ALKPHOS:5,BILITOT:5,PROT:5,ALBUMIN:5 in the last 168 hours No results found  for this basename: LIPASE:5,AMYLASE:5 in the last 168 hours No results found for this basename: AMMONIA:5 in the last 168 hours CBC:  Lab 10/18/11 0455 10/17/11 0750 10/16/11 0458 10/15/11 0456 10/14/11 2020  WBC 9.4 8.3 9.6 10.2 13.9*  NEUTROABS -- -- -- -- 12.4*  HGB 9.9* 9.4* 7.8* 8.1* 8.4*  HCT 31.3* 29.3* 24.8* 26.5* 26.7*  MCV 69.4* 69.3* 66.8* 67.6* 67.8*  PLT 499* 461* 468* 506* 535*   Cardiac Enzymes: No results found for this basename: CKTOTAL:5,CKMB:5,CKMBINDEX:5,TROPONINI:5 in the last 168 hours BNP (last 3 results) No results found for this basename: PROBNP:3 in the last  8760 hours CBG: No results found for this basename: GLUCAP:5 in the last 168 hours  Recent Results (from the past 240 hour(s))  SURGICAL PCR SCREEN     Status: Abnormal   Collection Time   10/16/11  7:42 AM      Component Value Range Status Comment   MRSA, PCR NEGATIVE  NEGATIVE Final    Staphylococcus aureus POSITIVE (*) NEGATIVE Final      Studies: Dg Shoulder Left  10/17/2011  *RADIOLOGY REPORT*  Clinical Data: Left shoulder pain.  No known injury.  LEFT SHOULDER - 2+ VIEW  Comparison: None.  Findings: Arthritic changes noted within the left [facility] joint and glenohumeral joint.  Erosive changes noted in the superior humeral head near the rotator cuff insertion, compatible with inflammatory arthropathy.  Degenerative changes in the left AC joint.  No fracture, subluxation or dislocation.  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   Original Report Authenticated By: Cyndie Chime, M.D.    Dg Humerus Left  10/17/2011  *RADIOLOGY REPORT*  Clinical Data: Left humerus pain.  No known injury.  LEFT HUMERUS - 2+ VIEW  Comparison: Shoulder series performed today.  Findings: No acute bony abnormality.  No fracture, subluxation or dislocation.  There appear be arthritic changes within the left elbow, but positioning is suboptimal for adequate evaluation.  Soft tissues are intact.  IMPRESSION: No acute bony abnormality.   Original Report Authenticated By: Cyndie Chime, M.D.     Scheduled Meds:    . amLODipine  10 mg Oral Daily  . lidocaine      . lisinopril  40 mg Oral Daily  . methylPREDNISolone acetate  160 mg Intra-articular Once  . metoprolol tartrate  25 mg Oral BID  . DISCONTD: hydrochlorothiazide  25 mg Oral Daily  . DISCONTD: lisinopril  20 mg Oral Daily   Continuous Infusions:    Principal Problem:  *Fibula upper end fracture Active Problems:  Hypokalemia  Anemia  Fall  Unable  to walk  Weakness generalized  Muscle spasms of lower extremity  Arthritis of both knees  Osteoporosis  HTN (hypertension)  Left ankle sprain    Time spent: > 35 minutes    [facility]  [facility] Pager [phone]. If 8PM-8AM, please contact night-coverage at [email or web address], password [facility] 10/18/2011, 6:12 PM  LOS: 4 days

## 2011-10-18 NOTE — Progress Notes (Addendum)
   Subjective: 2 Days Post-Op Procedure(s) (LRB): CLOSED MANIPULATION KNEE ()   Patient reports pain as mild, pain well controlled. States her knees really aren't hurting her much.  She is having more issues with the swelling in the left ankle. No events throughout the night  Objective:   VITALS:   Filed Vitals:   10/18/11 0414  BP: 168/94  Pulse: 89  Temp: 98.8 F (37.1 C)  Resp: 20   Pain with dorsiflexion of the left foot  Neurovascular intact No cellulitis present Compartment soft   LABS  Basename 10/18/11 0455 10/17/11 0750 10/16/11 0458  HGB 9.9* 9.4* 7.8*  HCT 31.3* 29.3* 24.8*  WBC 9.4 8.3 9.6  PLT 499* 461* 468*     Basename 10/18/11 0455 10/17/11 0750 10/16/11 0458  NA 131* 133* 131*  K 3.6 3.5 3.1*  BUN 7 6 8   CREATININE 0.43* 0.41* 0.37*  GLUCOSE 119* 108* 122*     Assessment/Plan: 2 Days Post-Op Procedure(s) (LRB): CLOSED MANIPULATION KNEE () Up with therapy Orthopaedically stable Knee immobilizers for comfort Rx for Norco and Flexeril on chart Follow up in 4 weeks at [facility]. Follow-up Information    Follow up with OLIN,Nolita Kutter D in 4 weeks.   Contact information:   [facility] [address] [place] [address] [phone]         Left ankle sprain X-rays reviewed - no acute bony abnormality on x-ray. ASO ankle brace to left ankle for comfort and stability.      April Graves   PAC  10/18/2011, 10:11 AM

## 2011-10-18 NOTE — Progress Notes (Signed)
MD ordered new medication for patient for blood pressure.  Patients blood pressure is now 176/102 manually.  Patient was educated about high blood pressure. Daughter stated, "that blood pressure is good, she don't need that medicine".  Education was reinforced.  Patient still refused. MD notified.  Will continue to monitor. Setzer, Don Broach

## 2011-10-19 ENCOUNTER — Inpatient Hospital Stay (HOSPITAL_COMMUNITY)
Admission: RE | Admit: 2011-10-19 | Discharge: 2011-10-25 | DRG: 946 | Disposition: A | Discharge status: Home Health | Payer: Medicare Other | Source: Ambulatory Visit | Attending: Physical Medicine & Rehabilitation | Admitting: Physical Medicine & Rehabilitation

## 2011-10-19 DIAGNOSIS — S82109A Unspecified fracture of upper end of unspecified tibia, initial encounter for closed fracture: Secondary | ICD-10-CM

## 2011-10-19 DIAGNOSIS — S82839A Other fracture of upper and lower end of unspecified fibula, initial encounter for closed fracture: Secondary | ICD-10-CM

## 2011-10-19 DIAGNOSIS — M7530 Calcific tendinitis of unspecified shoulder: Secondary | ICD-10-CM

## 2011-10-19 DIAGNOSIS — S82409A Unspecified fracture of shaft of unspecified fibula, initial encounter for closed fracture: Secondary | ICD-10-CM

## 2011-10-19 DIAGNOSIS — M19019 Primary osteoarthritis, unspecified shoulder: Secondary | ICD-10-CM

## 2011-10-19 DIAGNOSIS — R05 Cough: Secondary | ICD-10-CM

## 2011-10-19 DIAGNOSIS — E876 Hypokalemia: Secondary | ICD-10-CM

## 2011-10-19 DIAGNOSIS — S46909A Unspecified injury of unspecified muscle, fascia and tendon at shoulder and upper arm level, unspecified arm, initial encounter: Secondary | ICD-10-CM

## 2011-10-19 DIAGNOSIS — I10 Essential (primary) hypertension: Secondary | ICD-10-CM

## 2011-10-19 DIAGNOSIS — Z5189 Encounter for other specified aftercare: Secondary | ICD-10-CM

## 2011-10-19 DIAGNOSIS — J45909 Unspecified asthma, uncomplicated: Secondary | ICD-10-CM

## 2011-10-19 DIAGNOSIS — S4980XA Other specified injuries of shoulder and upper arm, unspecified arm, initial encounter: Secondary | ICD-10-CM

## 2011-10-19 DIAGNOSIS — R059 Cough, unspecified: Secondary | ICD-10-CM

## 2011-10-19 DIAGNOSIS — K5900 Constipation, unspecified: Secondary | ICD-10-CM

## 2011-10-19 DIAGNOSIS — X58XXXA Exposure to other specified factors, initial encounter: Secondary | ICD-10-CM

## 2011-10-19 DIAGNOSIS — D509 Iron deficiency anemia, unspecified: Secondary | ICD-10-CM

## 2011-10-19 MED ORDER — METOPROLOL TARTRATE 50 MG PO TABS
50.0000 mg | ORAL_TABLET | Freq: Two times a day (BID) | ORAL | Status: DC
  Administered 2011-10-19 – 2011-10-20 (×2): 50 mg via ORAL
  Filled 2011-10-19 (×8): qty 1

## 2011-10-19 MED ORDER — PROCHLORPERAZINE MALEATE 5 MG PO TABS
5.0000 mg | ORAL_TABLET | Freq: Four times a day (QID) | ORAL | Status: DC | PRN
  Filled 2011-10-19: qty 2

## 2011-10-19 MED ORDER — AMLODIPINE BESYLATE 10 MG PO TABS
10.0000 mg | ORAL_TABLET | Freq: Every day | ORAL | Status: DC
  Administered 2011-10-20 – 2011-10-25 (×6): 10 mg via ORAL
  Filled 2011-10-19 (×8): qty 1

## 2011-10-19 MED ORDER — SORBITOL 70 % SOLN
45.0000 mL | Freq: Once | Status: AC
  Administered 2011-10-19: 45 mL via ORAL
  Filled 2011-10-19: qty 30
  Filled 2011-10-19: qty 60

## 2011-10-19 MED ORDER — FLEET ENEMA 7-19 GM/118ML RE ENEM
1.0000 | ENEMA | Freq: Once | RECTAL | Status: AC
  Administered 2011-10-19: 1 via RECTAL
  Filled 2011-10-19 (×2): qty 1

## 2011-10-19 MED ORDER — FLEET ENEMA 7-19 GM/118ML RE ENEM
1.0000 | ENEMA | Freq: Once | RECTAL | Status: AC | PRN
  Filled 2011-10-19: qty 1

## 2011-10-19 MED ORDER — ACETAMINOPHEN 325 MG PO TABS: 325.0000 mg | ORAL_TABLET | ORAL | Status: DC | PRN

## 2011-10-19 MED ORDER — METHYLPREDNISOLONE ACETATE 80 MG/ML IJ SUSP
160.0000 mg | Freq: Once | INTRAMUSCULAR | Status: DC
  Filled 2011-10-19: qty 2

## 2011-10-19 MED ORDER — ENOXAPARIN SODIUM 40 MG/0.4ML ~~LOC~~ SOLN
40.0000 mg | SUBCUTANEOUS | Status: DC
  Administered 2011-10-19 – 2011-10-24 (×6): 40 mg via SUBCUTANEOUS
  Filled 2011-10-19 (×7): qty 0.4

## 2011-10-19 MED ORDER — METOPROLOL TARTRATE 50 MG PO TABS
50.0000 mg | ORAL_TABLET | Freq: Two times a day (BID) | ORAL | Status: DC
  Administered 2011-10-19: 50 mg via ORAL
  Filled 2011-10-19 (×2): qty 1

## 2011-10-19 MED ORDER — LISINOPRIL 40 MG PO TABS
40.0000 mg | ORAL_TABLET | Freq: Every day | ORAL | Status: DC
  Administered 2011-10-20 – 2011-10-25 (×6): 40 mg via ORAL
  Filled 2011-10-19 (×9): qty 1

## 2011-10-19 MED ORDER — METOPROLOL TARTRATE 50 MG PO TABS
75.0000 mg | ORAL_TABLET | Freq: Two times a day (BID) | ORAL | Status: DC
  Filled 2011-10-19 (×2): qty 1

## 2011-10-19 MED ORDER — BISACODYL 10 MG RE SUPP: 10.0000 mg | Freq: Every day | RECTAL | Status: DC | PRN

## 2011-10-19 MED ORDER — ALBUTEROL SULFATE (5 MG/ML) 0.5% IN NEBU: 2.5000 mg | INHALATION_SOLUTION | Freq: Four times a day (QID) | RESPIRATORY_TRACT | Status: DC | PRN

## 2011-10-19 MED ORDER — ALUM & MAG HYDROXIDE-SIMETH 200-200-20 MG/5ML PO SUSP
30.0000 mL | ORAL | Status: DC | PRN
  Administered 2011-10-20 – 2011-10-24 (×2): 30 mL via ORAL
  Filled 2011-10-19 (×4): qty 30

## 2011-10-19 MED ORDER — HYDRALAZINE HCL 10 MG PO TABS
10.0000 mg | ORAL_TABLET | ORAL | Status: DC | PRN
  Administered 2011-10-20: 10 mg via ORAL
  Filled 2011-10-19 (×3): qty 1

## 2011-10-19 MED ORDER — PROCHLORPERAZINE EDISYLATE 5 MG/ML IJ SOLN
5.0000 mg | Freq: Four times a day (QID) | INTRAMUSCULAR | Status: DC | PRN
  Filled 2011-10-19 (×3): qty 2

## 2011-10-19 MED ORDER — HYDROCODONE-ACETAMINOPHEN 5-325 MG PO TABS: 1.0000 | ORAL_TABLET | ORAL | Status: DC | PRN

## 2011-10-19 MED ORDER — POLYSACCHARIDE IRON COMPLEX 150 MG PO CAPS
150.0000 mg | ORAL_CAPSULE | Freq: Every day | ORAL | Status: DC
  Administered 2011-10-19: 150 mg via ORAL
  Filled 2011-10-19: qty 1

## 2011-10-19 MED ORDER — TRAMADOL HCL 50 MG PO TABS
50.0000 mg | ORAL_TABLET | Freq: Four times a day (QID) | ORAL | Status: DC | PRN
  Administered 2011-10-19: 50 mg via ORAL
  Filled 2011-10-19: qty 1

## 2011-10-19 MED ORDER — LIDOCAINE HCL (PF) 1 % IJ SOLN
30.0000 mL | Freq: Once | INTRAMUSCULAR | Status: DC
  Filled 2011-10-19: qty 30

## 2011-10-19 MED ORDER — POLYSACCHARIDE IRON COMPLEX 150 MG PO CAPS
150.0000 mg | ORAL_CAPSULE | Freq: Every day | ORAL | Status: DC
  Administered 2011-10-20 – 2011-10-25 (×5): 150 mg via ORAL
  Filled 2011-10-19 (×8): qty 1

## 2011-10-19 MED ORDER — POLYETHYLENE GLYCOL 3350 17 G PO PACK
17.0000 g | PACK | Freq: Every day | ORAL | Status: DC | PRN
  Filled 2011-10-19: qty 1

## 2011-10-19 MED ORDER — METHOCARBAMOL 100 MG/ML IJ SOLN
500.0000 mg | Freq: Four times a day (QID) | INTRAVENOUS | Status: DC | PRN
  Filled 2011-10-19: qty 5

## 2011-10-19 MED ORDER — DICLOFENAC SODIUM 1 % TD GEL
2.0000 g | Freq: Four times a day (QID) | TRANSDERMAL | Status: DC
  Administered 2011-10-19 – 2011-10-24 (×17): 2 g via TOPICAL
  Filled 2011-10-19: qty 100

## 2011-10-19 MED ORDER — PROCHLORPERAZINE 25 MG RE SUPP
12.5000 mg | Freq: Four times a day (QID) | RECTAL | Status: DC | PRN
  Filled 2011-10-19: qty 1

## 2011-10-19 MED ORDER — GUAIFENESIN-DM 100-10 MG/5ML PO SYRP
5.0000 mL | ORAL_SOLUTION | Freq: Four times a day (QID) | ORAL | Status: DC | PRN
  Administered 2011-10-21: 5 mL via ORAL
  Filled 2011-10-19: qty 10
  Filled 2011-10-19: qty 5

## 2011-10-19 NOTE — Progress Notes (Signed)
1845 Dr. Cato Mulligan notified regarding pt's elevated BP (see flowsheets) .  Medication order with telephone read back PRN.  See MAR.

## 2011-10-19 NOTE — Progress Notes (Signed)
   Subjective: 3 Days Post-Op Procedure(s) (LRB): CLOSED MANIPULATION KNEE ()   Patient reports pain as mild, pain well controlled. States her knees really aren't hurting her much, and feels the injections yesterday have really helped her knee pain. She is still having issues with the swelling in the left ankle. No events throughout the night.   Objective:   VITALS:   Filed Vitals:   10/19/11 0710  BP: 210/102  Pulse:   Temp: 99.3 F (37.4 C)   Resp:     Pain with dorsiflexion of the left foot  Neurovascular intact  No cellulitis present  Compartment soft    LABS  Basename 10/18/11 0455 10/17/11 0750  HGB 9.9* 9.4*  HCT 31.3* 29.3*  WBC 9.4 8.3  PLT 499* 461*     Basename 10/18/11 0455 10/17/11 0750  NA 131* 133*  K 3.6 3.5  BUN 7 6  CREATININE 0.43* 0.41*  GLUCOSE 119* 108*     Assessment/Plan: 3 Days Post-Op Procedure(s) (LRB): CLOSED MANIPULATION KNEE () Up with therapy  Orthopaedically stable  Knee immobilizers for comfort  Rx for Norco and Flexeril on chart  Follow up in 4 weeks at [facility].  Follow-up Information    Follow up with OLIN,Tamaya Pun D in 4 weeks.    Contact information:    [facility]  [address], [address]  [place] [address]  [phone]        Left ankle sprain  X-rays reviewed - no acute bony abnormality on x-ray.  ASO ankle brace to left ankle for comfort and stability.      Anastasio Auerbach Selma Rodelo   PAC  10/19/2011, 7:34 AM

## 2011-10-19 NOTE — Progress Notes (Signed)
Pt's BP was 173/93 at 0220 this a.m.  Educated the pt and pt's daughter on why she needed BP medication (Hydralazine IV) because her BP was still elevated after administering Metoprolol 50 mg tab. Explained the parameters of the medication (give if SBP > 160) and pt's daughter stated she "she doesn't need that medication and she won't be going home with an IV so she doesn't need that medicine".  Explained the necessity for taking this med but pt and pt's daughter adamantly refused.  Informed pt that I would be back at 0400 to check her BP.  Pt and pt's daughter confused about all the BP pills she is taking.  Will continue to monitor pt.

## 2011-10-19 NOTE — Progress Notes (Signed)
Physical Therapy Treatment Patient Details Name: April Graves MRN: [id number] DOB: 1956-01-01 Today's Date: 10/19/2011 Time: 4782-9562 PT Time Calculation (min): 40 min  PT Assessment / Plan / Recommendation Comments on Treatment Session  POD #3 L knee Closed Manipulation/ORIF not needed S/P fall down stairs w/ L prox fib fx (non displaced) Daughter present during session.  BP in supine prior to activity was 172/98.  Assisted pt to [facility] "bear hug" 1/4 pivot transfer @ total assist.  B LE's weak.  Pt requires increased VC's for direction and to stay on task.  Applied B KI's and amb pt with EVA walker for increased support.  Pt would benefit from CIR to regain prior level of mobility.    Follow Up Recommendations  Inpatient Rehab    Barriers to Discharge        Equipment Recommendations  Rolling walker with 5" wheels;3 in 1 bedside comode;Wheelchair (measurements)    Recommendations for Other Services    Frequency Min 5X/week   Plan Discharge plan remains appropriate    Precautions / Restrictions Precautions Precautions: Fall Precaution Comments: HYPERTENSION.MONITOR BP ; encourage knee extension Required Braces or Orthoses: Knee Immobilizer - Right;Knee Immobilizer - Left Knee Immobilizer - Right:  (Use for support for amb) Knee Immobilizer - Left:  (use for comfort for amb/support) Other Brace/Splint: L ankle ASO for comfort Restrictions Weight Bearing Restrictions: No Other Position/Activity Restrictions: WBAT    Pertinent Vitals/Pain No c/o pain    Mobility  Bed Mobility Bed Mobility: Supine to Sit Supine to Sit: 3: Mod assist Details for Bed Mobility Assistance: increased time and mod assist to guide B LE off bed  and support upper body to sit up  Transfers Transfers: Sit to Stand;Stand to Sit Sit to Stand: 1: +2 Total assist;From bed;From toilet Sit to Stand: Patient Percentage: 20% Stand to Sit: 3: Mod assist;To toilet;To chair/3-in-1 Details for Transfer  Assistance: Assisted pt from bed to [facility] w/o B KI so pt could void and perform hygiene easier.  Applied B KI's for amb, pt required + 2 asssit with sit to stand due to B LE in full extension.  Ambulation/Gait Ambulation/Gait Assistance: 1: +2 Total assist Ambulation/Gait: Patient Percentage: 50% Ambulation Distance (Feet): 50 Feet Assistive device: Carley Hammed walker Ambulation/Gait Assistance Details: Used EVA walker and B KI's for increased support.  Pt required 75% VS's for sequencing and tactile cueing to increase posture and facilitate equal weight shift as pt tended to lean to her left with anterior pelvic tilt and hip flexion. No c/o pain during activity. daughter assisted by following with chair.  Gait Pattern: Step-to pattern;Decreased stance time - right;Decreased stride length;Trunk flexed Gait velocity: decreased     PT Goals    Visit Information  Last PT Received On: 10/19/11 Assistance Needed: +2                   End of Session PT - End of Session Equipment Utilized During Treatment: Gait belt Activity Tolerance: Patient tolerated treatment well Patient left: in chair;with call bell/phone within reach;with family/visitor present   Felecia Shelling  PTA [facility]  Acute  Rehab Pager     [phone]

## 2011-10-19 NOTE — Progress Notes (Signed)
Pt's BP was 182/93 this morning with the dinamap.  Rechecked pt's BP manually and got reading of 210/102 and HR of 84.  Pt and pt's daughter were hesitant about wanting to take anything for her high BP.  After discussing with the pt that she wouldn't be getting any BP meds until 10am she reluctantly agreed to take the Hydralazine 10 mg IV.  Explained to pt that BP reading was extremely high and that she needed the medication now.  Pt has many questions about her BP meds.  Attempted to educate the pt and pt's daughter further about her BP medications without much success.  Will continue to monitor pt and her blood pressure.

## 2011-10-19 NOTE — Interval H&P Note (Signed)
April Graves was admitted today to Inpatient Rehabilitation with the diagnosis of left tibial plateau fx, fibular fx, likely left rotator cuff injury.  The patient's history has been reviewed, patient examined, and there is no change in status.  Patient continues to be appropriate for intensive inpatient rehabilitation.  I have reviewed the patient's chart and labs.  Questions were answered to the patient's satisfaction.  Mirranda Monrroy T 10/19/2011, 8:13 PM

## 2011-10-19 NOTE — Progress Notes (Signed)
Rehab admissions - Evaluated for possible admission.  I met with patient and her daughter.  They would like inpatient rehab prior to home.  Dtr has been staying with patient in the hospital.  I talked with Dr. Janee Morn.  Patient's BP is elevated.  He has added medications and increased dosage, so BP likely to be better later today.  I will plan to admit to inpatient rehab today if BP stable later today.  Call me for questions.  #[phone]
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
## 2011-10-19 NOTE — Progress Notes (Signed)
1705 Pt. Admitted to 4148 rehab from [facility]42.  Report received from [place], [place].  Pt. Arrived AOX3, BP elevated, PA made aware.  Family at patients bedside.  Oriented to rehab. Meal tray ordered upon arrival.

## 2011-10-19 NOTE — Plan of Care (Signed)
Overall Plan of Care [facility]) Patient Details Name: April Graves MRN: [id number] DOB: 1955/08/10  Diagnosis:  Left fibula and tibial fx, left rotator cuff injury  Primary Diagnosis:    Tibial plateau fracture Co-morbidities: asthma, htn  Functional Problem List  Patient demonstrates impairments in the following areas: Balance, Endurance, Medication Management, Motor, Nutrition, Pain and Safety  Basic ADL's: grooming, bathing, dressing and toileting Advanced ADL's: simple meal preparation  Transfers:  bed mobility, bed to chair, toilet, tub/shower, car and furniture Locomotion:  ambulation and wheelchair mobility  Additional Impairments:  Functional use of upper extremity  Anticipated Outcomes Item Anticipated Outcome  Eating/Swallowing  Min assist  Basic self-care  Min assist  Tolieting  Min assist  Bowel/Bladder  Min assist  Transfers  Min assist  Locomotion  Min assist with gait using LRAD and with w/c x 50'  Communication    Cognition    Pain  Min assist  Safety/Judgment  Min assist  Other     Therapy Plan: PT Frequency: 1-2 X/day, 60-90 minutes  OT Frequency:  1-2x/day, 60-90 minutes, 5 out of 7 days a week     Team Interventions: Item RN PT OT SLP SW TR Other  Self Care/Advanced ADL Retraining   x      Neuromuscular Re-Education         Therapeutic Activities  x x      UE/LE Strength Training/ROM  x x      UE/LE Coordination Activities   x      Visual/Perceptual Remediation/Compensation         DME/Adaptive Equipment Instruction  x x      Therapeutic Exercise  x x      Balance/Vestibular Training  x x      Patient/Family Education x x x      Cognitive Remediation/Compensation         Functional Mobility Training  x x      Ambulation/Gait Training  x       Museum/gallery curator  x       Wheelchair Propulsion/Positioning  x x      Functional Musician   x      Dysphagia/Aspiration Sales executive         Bladder Management x        Bowel Management x        Disease Management/Prevention x        Pain Management x  x      Medication Management x        Skin Care/Wound Management x        Splinting/Orthotics x        Discharge Planning   x      Psychosocial Support x  x                         Team Discharge Planning: Destination:  Home Projected Follow-up:  PT, OT and Home Health Projected Equipment Needs:  Bedside Commode and Tub Bench Patient/family involved in discharge planning:  Yes  MD ELOS: 2 weeks Medical Rehab Prognosis:  Excellent Assessment: pt admitted for cir therapies. The team will be addressing self-care, pain mgt, precautions from an orthopedic standpoint, fxnl mobility, with goals set at minimal assist. Pt's family is quite supportive.
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
## 2011-10-19 NOTE — Progress Notes (Signed)
Lab technician reported that pt's daughter refused the pt's morning labs this morning.  The pt's daughter stated that the "MD cancelled all labs this morning".  Will inform day RN about pt's morning labs.
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
## 2011-10-20 ENCOUNTER — Inpatient Hospital Stay (HOSPITAL_COMMUNITY): Payer: Medicare Other | Admitting: *Deleted

## 2011-10-20 ENCOUNTER — Inpatient Hospital Stay (HOSPITAL_COMMUNITY): Payer: Medicare Other | Admitting: Physical Therapy

## 2011-10-20 DIAGNOSIS — Z5189 Encounter for other specified aftercare: Secondary | ICD-10-CM

## 2011-10-20 DIAGNOSIS — S82109A Unspecified fracture of upper end of unspecified tibia, initial encounter for closed fracture: Secondary | ICD-10-CM

## 2011-10-20 DIAGNOSIS — M7530 Calcific tendinitis of unspecified shoulder: Secondary | ICD-10-CM

## 2011-10-20 DIAGNOSIS — S82839A Other fracture of upper and lower end of unspecified fibula, initial encounter for closed fracture: Secondary | ICD-10-CM

## 2011-10-20 DIAGNOSIS — I10 Essential (primary) hypertension: Secondary | ICD-10-CM

## 2011-10-20 LAB — TYPE AND SCREEN
ABO/RH(D): B POS
Unit division: 0
Unit division: 0

## 2011-10-20 MED ORDER — ONDANSETRON HCL 4 MG/2ML IJ SOLN: 4.0000 mg | Freq: Four times a day (QID) | INTRAMUSCULAR | Status: DC | PRN

## 2011-10-20 NOTE — Progress Notes (Signed)
Patient with 4 episodes of liquid dark green emesis. Patient reports "cramping" sensation  Proximal to umbilical area. Bowel sounds positive X 4 quads without tender to touch. Daughter reports 2 large formed "brown" stools after sorbitol and enema earlier. Vitals checked and in computer, will recheck BP. Paged Dr. Cato Mulligan related to above with orders to give zofran IM if compazine not effective. Alfredo Martinez A
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
## 2011-10-20 NOTE — Progress Notes (Signed)
Orthopedic Tech Progress Note Patient Details:  April Graves 1955-08-13 [id number]  Patient ID: [name], female   DOB: 20-Aug-1955, 56 y.o.   MRN: [id number] Confirmed with pt RN that pt has bilateral knee immobilizers.    Melda Mermelstein T 10/20/2011, 11:35 AM

## 2011-10-20 NOTE — Progress Notes (Addendum)
Patient and daughter refusing blood pressure meds at the schedule time, patient chooses whatever medicine she would want to take.This Clinical research associate tried to explain to the patient and family about the actions and interactions of her meds with its other but patient continue to refused to listen and insist to take whatever she wants. Will continue to monitor and will addressed appropriately. Will leave a note for MD to evaluate.
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
## 2011-10-20 NOTE — Progress Notes (Signed)
Small liquid stool. Currently using bedpan. No further N&V. Alfredo Martinez A

## 2011-10-20 NOTE — Progress Notes (Signed)
Physical Therapy Session Note  Patient Details  Name: April Graves MRN: [id number] Date of Birth: [birth year]  Today's Date: 10/20/2011 Time: 1400-1500 Time Calculation (min): 60 min  Short Term Goals: Week 1:  PT Short Term Goal 1 (Week 1): Patient will be able to perform bed mobility with Mod-Assist. PT Short Term Goal 2 (Week 1): Patient will be able to perform all Transfers with Mod-Assist. PT Short Term Goal 3 (Week 1): Patient will be able to perform gait using LRAD x 20' with min-Assist. PT Short Term Goal 4 (Week 1): Patient will be able to perform wheelchair mobility x 100' with Supervised assist.  Skilled Therapeutic Interventions/Progress Updates:  Ambulation/gait training;Balance/vestibular training;DME/adaptive equipment instruction;Functional mobility training;Patient/family education;Splinting/orthotics;Therapeutic Activities;Therapeutic Exercise;UE/LE Strength taining/ROM;Wheelchair propulsion/positioning   Therapy Documentation Precautions:  Precautions: Fall Precaution Comments: HYPERTENSION.MONITOR BP ; encourage knee extension Required Braces or Orthoses: Knee Immobilizer - Right;Knee Immobilizer - Left Knee Immobilizer - Right: On when out of bed or walking Knee Immobilizer - Left: On when out of bed or walking Other Brace/Splint: Left ankle ASO Restrictions Weight Bearing Restrictions: No Other Position/Activity Restrictions: WBAT Pain: L knee 2, R knee 0, L ankle 2 at rest  Therapeutic Exercise;(15') B LE's in supine with AAROM and PROM stretching Therapeutic Activity:(15') Donning B knee immobilizers and L ASO with TA                                          Transfer Training supine to Right sidelying and into sitting EOB with ModA                                           Transfer sit->stand with Max-A, stand->sit with Mod-A Gait Training:(15') using handheld assist x 10' with Mod-A, B knee immobilizers on and L ASO. Short steps and patient looking  downward. Verbal cues for posture. Will possibly do well with RW versus B PFRW. W/C management:(15')  Propelling w/c x 45' with S/min-A   See FIM for current functional status  Therapy/Group: Individual Therapy  Rex Kras 10/20/2011, 5:53 PM

## 2011-10-20 NOTE — Progress Notes (Signed)
B/P=174/99, PRN apresoline given per parameters. Patient reports no further N&V, refuses IM compazine at this time. Patient with obvious memory deficits. Daughter at bedside providing hands on assistance. Decreased ROM to LUE, injection scheduled for morning. April Graves

## 2011-10-21 ENCOUNTER — Inpatient Hospital Stay (HOSPITAL_COMMUNITY): Payer: Medicare Other | Admitting: *Deleted

## 2011-10-21 NOTE — Progress Notes (Signed)
No complaints   BP Readings from Last 3 Encounters:  10/21/11 153/93  10/19/11 158/82  10/19/11 158/82    Well-developed well-nourished female in no acute distress. HEENT exam atraumatic, normocephalic, extraocular muscles are intact. Neck is supple. No jugular venous distention no thyromegaly. Chest clear to auscultation without increased work of breathing. Cardiac exam S1 and S2 are regular. Abdominal exam active bowel sounds, soft, nontender. Extremities no edema.    Post Admission Physician Evaluation:  1. Functional deficits secondary to left tibial plateau fx (small), fibular head/neck fx, left rotator cuff injury. 2. Patient is admitted to receive collaborative, interdisciplinary care between the physiatrist, rehab nursing staff, and therapy team. 3. Patient's level of medical complexity and substantial therapy needs in context of that medical necessity cannot be provided at a lesser intensity of care such as a SNF. 4. Patient has experienced substantial functional loss from his/her baseline which was documented above under the "Functional History" and "Functional Status" headings. Judging by the patient's diagnosis, physical exam, and functional history, the patient has potential for functional progress which will result in measurable gains while on inpatient rehab. These gains will be of substantial and practical use upon discharge in facilitating mobility and self-care at the household level. 5. Physiatrist will provide 24 hour management of medical needs as well as oversight of the therapy plan/treatment and provide guidance as appropriate regarding the interaction of the two. 6. 24 hour rehab nursing will assist with bladder management, bowel management, safety, skin/wound care, disease management, medication administration, pain management and patient education and help integrate therapy concepts, techniques,education, etc. 7. PT will assess and treat for: fxnl mobility, pain, ortho  precautions, adaptive equipment, safety. Goals are: supervision to minimal assist. 8. OT will assess and treat for: UE strength, safety, fxnl mobility, safety. Goals are: mod I to minimal assist. 9. SLP will assess and treat for: n/a Goals are: n/a. 10. Case Management and Social Worker will assess and treat for psychological issues and discharge planning. 11. Team conference will be held weekly to assess progress toward goals and to determine barriers to discharge. 12. Patient will receive at least 3 hours of therapy per day at least 5 days per week. 13. ELOS: 2 weeks Prognosis: excellent Medical Problem List and Plan:  1. DVT Prophylaxis/Anticoagulation: will add Pharmaceutical: Lovenox  2. Pain Management: prn vicodin  3. Mood: team will provide egosupport as appropriate. She seems to be in good spirits and generally motivated.  4. Neuropsych: This patient is capable of making decisions on his/her own behalf.  5. HTN: monitor with bid checks. Continue to encourage patient to be compliant with medications. Continue lisinopril, metoprolol and amlodipine.  6. DJD left shoulder: may need outpatient eval. 7. Anemia-- will need outpatient f/u 8. Cough- prn meds
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
## 2011-10-21 NOTE — Progress Notes (Signed)
Occupational Therapy Note  Patient Details  Name: April Graves MRN: [id number] Date of Birth: [birth year] Today's Date: 10/21/2011  [place]:  1430-1500  (30 min)  Individual therapy Pain:  6/10  (see MAR)  Pt. Lying in bed.  Educated daughter on application of KI and ASO's to both ankles.  Daughter verbalized understanding of donning and doffing orthosis.  Practiced hand strengthening exercises with snaps.  Pt. Struggled but able to do after increased time.     [name] 10/21/2011, 5:57 PM

## 2011-10-22 ENCOUNTER — Inpatient Hospital Stay (HOSPITAL_COMMUNITY): Payer: Medicare Other | Admitting: Occupational Therapy

## 2011-10-22 ENCOUNTER — Inpatient Hospital Stay (HOSPITAL_COMMUNITY): Payer: Medicare Other

## 2011-10-22 ENCOUNTER — Inpatient Hospital Stay (HOSPITAL_COMMUNITY): Payer: Medicare Other | Admitting: Physical Therapy

## 2011-10-22 LAB — COMPREHENSIVE METABOLIC PANEL
Alkaline Phosphatase: 209 U/L — ABNORMAL HIGH (ref 39–117)
BUN: 24 mg/dL — ABNORMAL HIGH (ref 6–23)
Calcium: 10.2 mg/dL (ref 8.4–10.5)
Creatinine, Ser: 0.42 mg/dL — ABNORMAL LOW (ref 0.50–1.10)
GFR calc Af Amer: >90 mL/min (ref >90)
Glucose, Bld: 117 mg/dL — ABNORMAL HIGH (ref 70–99)
Total Protein: 8.9 g/dL — ABNORMAL HIGH (ref 6.0–8.3)

## 2011-10-22 LAB — URINALYSIS, ROUTINE W REFLEX MICROSCOPIC
Bilirubin Urine: NEGATIVE
Glucose, UA: NEGATIVE mg/dL
Hgb urine dipstick: NEGATIVE
Protein, ur: NEGATIVE mg/dL

## 2011-10-22 LAB — CBC WITH DIFFERENTIAL/PLATELET
Basophils Relative: 0 % (ref 0–1)
Eosinophils Absolute: 0 10*3/uL (ref 0.0–0.7)
Eosinophils Relative: 0 % (ref 0–5)
Hemoglobin: 11.3 g/dL — ABNORMAL LOW (ref 12.0–15.0)
Lymphs Abs: 2.4 10*3/uL (ref 0.7–4.0)
MCH: 22.5 pg — ABNORMAL LOW (ref 26.0–34.0)
MCHC: 31.7 g/dL (ref 30.0–36.0)
MCV: 70.8 fL — ABNORMAL LOW (ref 78.0–100.0)
Monocytes Relative: 11 % (ref 3–12)
RBC: 5.03 MIL/uL (ref 3.87–5.11)

## 2011-10-22 MED ORDER — LISINOPRIL 10 MG PO TABS: 10.0000 mg | ORAL_TABLET | Freq: Every day | ORAL | Status: DC

## 2011-10-22 MED ORDER — ACETAMINOPHEN 325 MG PO TABS
650.0000 mg | ORAL_TABLET | Freq: Three times a day (TID) | ORAL | Status: DC
  Administered 2011-10-22 (×2): 650 mg via ORAL
  Administered 2011-10-23: 325 mg via ORAL
  Administered 2011-10-23 – 2011-10-24 (×2): 650 mg via ORAL
  Administered 2011-10-24: 325 mg via ORAL
  Administered 2011-10-25: 650 mg via ORAL
  Filled 2011-10-22 (×5): qty 2
  Filled 2011-10-22: qty 1

## 2011-10-22 MED ORDER — SENNOSIDES-DOCUSATE SODIUM 8.6-50 MG PO TABS
1.0000 | ORAL_TABLET | Freq: Two times a day (BID) | ORAL | Status: DC
  Administered 2011-10-22: 1 via ORAL
  Filled 2011-10-22 (×3): qty 1
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
## 2011-10-22 NOTE — Progress Notes (Signed)
Physical Therapy Session Note  Patient Details  Name: April Graves MRN: [id number] Date of Birth: [birth year]  Today's Date: 10/22/2011 Time: 1116-1201 Time Calculation (min): 45 min  Short Term Goals: Week 1:  PT Short Term Goal 1 (Week 1): Patient will be able to perform bed mobility with Mod-Assist. PT Short Term Goal 2 (Week 1): Patient will be able to perform all Transfers with Mod-Assist. PT Short Term Goal 3 (Week 1): Patient will be able to perform gait using LRAD x 20' with min-Assist. PT Short Term Goal 4 (Week 1): Patient will be able to perform wheelchair mobility x 100' with Supervised assist.  Skilled Therapeutic Interventions/Progress Updates:    See below for details.  Pt demo. ? Cognitive deficits/understanding of dieficits.  Therapy Documentation Precautions:  Precautions Precautions: Fall Precaution Comments: HYPERTENSION.MONITOR BP ; encourage knee extension Required Braces or Orthoses: Knee Immobilizer - Right;Knee Immobilizer - Left Knee Immobilizer - Right: On when out of bed or walking Knee Immobilizer - Left: On when out of bed or walking Other Brace/Splint: Left ankle ASO Restrictions Weight Bearing Restrictions:  (WBAT ) Other Position/Activity Restrictions: WBAT Pain:  Pt reported pain in knees after ambulation, not rated. Mobility:  Sit to stand with max @ from w/c with RW. Locomotion :  Gait in parallel bars x 80' with min@, then with RW x 50' with min@.  See FIM for current functional status  Therapy/Group: Individual Therapy  Georges Mouse 10/22/2011, 12:09 PM

## 2011-10-22 NOTE — Progress Notes (Signed)
Occupational Therapy Session Note  Patient Details  Name: April Graves MRN: [id number] Date of Birth: 11/24/1955  Today's Date: 10/22/2011 Time: 0930-1030 Time Calculation (min): 60 min  Short Term Goals: Week 1:  OT Short Term Goal 1 (Week 1): Pt. will be supervison with UB bathing. OT Short Term Goal 2 (Week 1): Pt. will be minimal assist with LB bathing. OT Short Term Goal 3 (Week 1): Pt. will be supervision with UB dressing OT Short Term Goal 4 (Week 1): Pt. will be minimal assist with LB dressing OT Short Term Goal 5 (Week 1): pt. will be moderate assist with toilet transfers.    Skilled Therapeutic Interventions/Progress Updates:  Patient found seated edge of bed with daughter present in room. Patient worked on doffing socks sitting edge of bed and threading bilateral LEs into pants. Therapist then introduced AE (reacher, sock aid, long handled shoe horn, and long handled sponge); patient acted interested in AE, then stated she wanted to dress self without using AE. Therapist donned bilateral ankle braces while patient sat edge of bed, then therapist assisted patient to supine position to donn bilateral knee immobilizers. Patient then sat edge of bed to donn shirt with min assist. Patient anxious throughout session, asking many questions and apprehensive about trying new tasks. At end of session left patient seated edge of bed for next therapy session.   Precautions:  Precautions Precautions: Fall Precaution Comments: HYPERTENSION.MONITOR BP ; encourage knee extension Required Braces or Orthoses: Knee Immobilizer - Right;Knee Immobilizer - Left Knee Immobilizer - Right: On when out of bed or walking Knee Immobilizer - Left: On when out of bed or walking Other Brace/Splint: Left ankle ASO Restrictions Weight Bearing Restrictions:  (WBAT ) Other Position/Activity Restrictions: WBAT  See FIM for current functional status  Therapy/Group: Individual  Therapy  Kamesha Herne 10/22/2011, 10:34 AM

## 2011-10-22 NOTE — Progress Notes (Signed)
Physical Therapy Session Note  Patient Details  Name: April Graves MRN: [id number] Date of Birth: 1955-03-12  Today's Date: 10/22/2011 Time: 1400-1445 Time Calculation (min): 45 min  Short Term Goals: Week 1:  PT Short Term Goal 1 (Week 1): Patient will be able to perform bed mobility with Mod-Assist. PT Short Term Goal 2 (Week 1): Patient will be able to perform all Transfers with Mod-Assist. PT Short Term Goal 3 (Week 1): Patient will be able to perform gait using LRAD x 20' with min-Assist. PT Short Term Goal 4 (Week 1): Patient will be able to perform wheelchair mobility x 100' with Supervised assist.  Upon entering room, pt and daughter present with pt wearing something resembling hard knee pads. Daughter and pt both state that the doctor said that is fine for her to wear during therapy, and pt stated she did not want to wear the KI's any longer. Clarified with PA and RN with regards to wearing schedule for KI's, knee pads, and bracing overall. PA clarified that pt can transfer (squat pivot or stand step) without KI's on, but must wear bilaterally for gait.     RN also reported pt's daughter was transferring her in/out of bed and to the commode without staff assistance. Reviewed wearing schedule of KI's with pt and pt's daughter as well as focused treatment session on education to daughter for safe transfers. Determined that using the RW would be best option and safest in order for pt to maintain balance and reiterated multiple times throughout session when she needs to have bracing on and when she can transfer without it. Pt continues to need reinforcement, but daughter able to verbalize correct wearing schedule and safely demonstrate transfers recliner <-> BSC and bed <-> chair with several repetitions. Pt requiring mod/max A for sit to stand and steady A during the pivot with cueing for technique and safety.   Daughter reports being concerned at the amount of therapy pt is receiving  stating she thinks its too much though pt states she is not tired. Reminded pt and daughter that this is rehab, and pt is required to participate in 3 hours a day and encouraged them that we are practicing the things she needs to be able to do to go home. Pt's daughter stated that pt didn't do very much at home. Verbalized that therapist will discuss concerns with the team and that there is a conference meeting tomorrow as well.  Safety plan changed to indicate that it is Ok for daughter to transfer pt and notified RN of proper technique.  Therapy Documentation Precautions:  Precautions Precautions: Fall Precaution Comments: HYPERTENSION.MONITOR BP ; encourage knee extension Required Braces or Orthoses: Knee Immobilizer - Right;Knee Immobilizer - Left Knee Immobilizer - Right: On when out of bed or walking Knee Immobilizer - Left: On when out of bed or walking Other Brace/Splint: Left ankle ASO Restrictions Weight Bearing Restrictions: No Other Position/Activity Restrictions: WBAT   Pain:  Did not complain of pain.   See FIM for current functional status  Therapy/Group: Individual Therapy  Karolee Stamps [facility] 10/22/2011, 3:48 PM

## 2011-10-22 NOTE — Progress Notes (Signed)
Physical Therapy Session Note  Patient Details  Name: April Graves MRN: [id number] Date of Birth: [birth year]  Today's Date: 10/22/2011 Time: 1030-1100 Time Calculation (min): 30 min  Short Term Goals: Week 1:  PT Short Term Goal 1 (Week 1): Patient will be able to perform bed mobility with Mod-Assist. PT Short Term Goal 2 (Week 1): Patient will be able to perform all Transfers with Mod-Assist. PT Short Term Goal 3 (Week 1): Patient will be able to perform gait using LRAD x 20' with min-Assist. PT Short Term Goal 4 (Week 1): Patient will be able to perform wheelchair mobility x 100' with Supervised assist.  Assisted pt with putting on shoes EOB, balance with S. Pt and daughter continually talk during session and want to complete tasks their way despite recommendations from therapist. Required significant amount of extra time and total cueing to complete a transfer to w/c with assist from daughter as second person. Encouraged pt to do as much as she can but daughter just wanting to pick her up and pt stating she can't do much. Flexed posture in standing and difficulty moving LEs during the pivot. W/c mobility with min A and pt/daughter wanting to work on UEs on arm bike for final 5 minutes of session on random program.       Therapy Documentation Precautions:  Precautions Precautions: Fall Precaution Comments: HYPERTENSION.MONITOR BP ; encourage knee extension Required Braces or Orthoses: Knee Immobilizer - Right;Knee Immobilizer - Left Knee Immobilizer - Right: On when out of bed or walking Knee Immobilizer - Left: On when out of bed or walking Other Brace/Splint: Left ankle ASO Restrictions Weight Bearing Restrictions:  (WBAT ) Other Position/Activity Restrictions: WBAT    Pain: C/o pain in shoulder for which she had an injection in this AM. RN aware.  See FIM for current functional status  Therapy/Group: Individual Therapy  Karolee Stamps [facility] 10/22/2011, 11:23 AM

## 2011-10-23 ENCOUNTER — Inpatient Hospital Stay (HOSPITAL_COMMUNITY): Payer: Medicare Other

## 2011-10-23 ENCOUNTER — Inpatient Hospital Stay (HOSPITAL_COMMUNITY): Payer: Medicare Other | Admitting: Occupational Therapy

## 2011-10-23 LAB — URINE CULTURE: Colony Count: 2000

## 2011-10-23 MED ORDER — POLYETHYLENE GLYCOL 3350 17 G PO PACK
17.0000 g | PACK | Freq: Every day | ORAL | Status: DC
  Administered 2011-10-23: 17 g via ORAL
  Filled 2011-10-23 (×3): qty 1

## 2011-10-23 NOTE — Progress Notes (Signed)
Per nursing patient  was given "Data Collection Information Summary for Patients in inpatient Rehabilitaition Facilities" with attached "Privacy Act Statement Health Care Records" upon admission. Patient information reviewed and entered into [employer] system by Klyde Banka, RN-BC, CRRN,  covering PPS Coordinator.  Information including coding and functional independence measure will be reviewed and updated through discharge.   

## 2011-10-23 NOTE — Progress Notes (Signed)
Physical Therapy Session Note  Patient Details  Name: April Graves MRN: [id number] Date of Birth: 07-04-1955  Today's Date: 10/23/2011 Time: 1440-1540 Time Calculation (min): 60 min  Short Term Goals: Week 1:  PT Short Term Goal 1 (Week 1): Patient will be able to perform bed mobility with Mod-Assist. PT Short Term Goal 2 (Week 1): Patient will be able to perform all Transfers with Mod-Assist. PT Short Term Goal 3 (Week 1): Patient will be able to perform gait using LRAD x 20' with min-Assist. PT Short Term Goal 4 (Week 1): Patient will be able to perform wheelchair mobility x 100' with Supervised assist.   Therapist changed out w/c for more appropriate fit and due to brake not locking securely. Pt and daughter complained that the new one didn't drive as well. Reminded them that this is temporary and her w/c for home will be new. Recommend 16 x16 w/c with bilateral elevating leg rests.   Treatment focused on w/c propulsion on unit for endurance training and functional mobility with supervision and max cueing for technique and obstacle negotiation. Car transfer with pt's daughter return demonstrating stand pivot technique. Pt able to do it with min A but prefers daughter to just lift her up. Gait with RW and KI's on with steady A x 50' with cues for safety with AD and posture.    Spent a fair amount of session discussing the need for use of braces at appropriate times (pt still adiment that the doctor told her she doesn't need the KI's), purpose and nature of Rehab being 3 hours a day, and overall need for pt to attempt mobility as much as she can without the daughter just picking her up. It doesn't appear that daughter or patient have grasped an understanding of the nature or purpose of rehab. They state they want to stay here "longer" but do not appear to want to put in the time/effort stating it is too much for her to be doing.      Therapy Documentation Precautions:   Precautions Precautions: Fall Precaution Comments: HYPERTENSION.MONITOR BP ; encourage knee extension Required Braces or Orthoses: Knee Immobilizer - Right;Knee Immobilizer - Left Knee Immobilizer - Right:  (when walking) Knee Immobilizer - Left:  (when walking) Other Brace/Splint:  (Bilateral ankle ASO) Restrictions Weight Bearing Restrictions: No Other Position/Activity Restrictions: WBAT    Pain: Reports general pain in LEs and L shoulder - daughter to notify RN at end of session. Locomotion : Ambulation Ambulation/Gait Assistance: 4: Min guard   See FIM for current functional status  Therapy/Group: Individual Therapy  Karolee Stamps [facility] 10/23/2011, 4:33 PM
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
## 2011-10-23 NOTE — Progress Notes (Signed)
Occupational Therapy Note  Patient Details  Name: April Graves MRN: [id number] Date of Birth: 20-Jan-1956 Today's Date: 10/23/2011  Time: 1045-1130 Pt denies pain Individual Therapy  Pt in w/c with daughter in room.  Pt agreeable to participating in arm exercises with UBE in gym.  Focus on w/c mobility to get to gym.  Pt required max verbal and tactile cues to roll and navigate w/c.  Daughter often contradicted therapist recommendations and patient would become frustrated.  Pt completed UBE exercises 3 X 3 min at level 3.   Lavone Neri [facility] 10/23/2011, 3:34 PM

## 2011-10-23 NOTE — Progress Notes (Signed)
Occupational Therapy Session Note  Patient Details  Name: April Graves MRN: [id number] Date of Birth: 03-16-1955  Today's Date: 10/23/2011 Time: 0850-1000 Time Calculation (min): 70 min  Short Term Goals: Week 1:  OT Short Term Goal 1 (Week 1): Pt. will be supervison with UB bathing. OT Short Term Goal 2 (Week 1): Pt. will be minimal assist with LB bathing. OT Short Term Goal 3 (Week 1): Pt. will be supervision with UB dressing OT Short Term Goal 4 (Week 1): Pt. will be minimal assist with LB dressing OT Short Term Goal 5 (Week 1): pt. will be moderate assist with toilet transfers.    Skilled Therapeutic Interventions/Progress Updates:  Patient sitting EOB with daughter at her bedside upon arrival.  Self care retraining to include sponge bath and dressing.  Focus session on patient and daughter education, adaptive techniques and activity tolerance.  Patient requested bath water to be brought to bedside instead of sit at sink and declined to stand to wash peri area or to pull up pants due to bilateral knee pain.  Patient practiced lateral leans for these tasks.  Patient and daughter agreed that her goal before leaving is to be able to stand for these tasks and to get out of bed for all toileting needs.  Therapy Documentation Precautions:  Precautions Precautions: Fall Precaution Comments: HYPERTENSION.MONITOR BP ; encourage knee extension Required Braces or Orthoses: Knee Immobilizer - Right;Knee Immobilizer - Left Knee Immobilizer - Right:  (when walking) Knee Immobilizer - Left:  (when walking) Other Brace/Splint:  (Bilateral ankle ASO) Restrictions Weight Bearing Restrictions: No Other Position/Activity Restrictions: WBAT Pain: 8/10 pain in knees, premedicated, able to participate in therapy by adapting the tasks, RN aware  See FIM for current functional status  Therapy/Group: Individual Therapy  Khyrie Masi 10/23/2011, 1:49 PM

## 2011-10-24 ENCOUNTER — Inpatient Hospital Stay (HOSPITAL_COMMUNITY): Payer: Medicare Other | Admitting: Occupational Therapy

## 2011-10-24 ENCOUNTER — Inpatient Hospital Stay (HOSPITAL_COMMUNITY): Payer: Medicare Other

## 2011-10-24 DIAGNOSIS — S82839A Other fracture of upper and lower end of unspecified fibula, initial encounter for closed fracture: Secondary | ICD-10-CM

## 2011-10-24 DIAGNOSIS — S82109A Unspecified fracture of upper end of unspecified tibia, initial encounter for closed fracture: Secondary | ICD-10-CM

## 2011-10-24 DIAGNOSIS — M7530 Calcific tendinitis of unspecified shoulder: Secondary | ICD-10-CM

## 2011-10-24 DIAGNOSIS — Z5189 Encounter for other specified aftercare: Secondary | ICD-10-CM

## 2011-10-24 DIAGNOSIS — I10 Essential (primary) hypertension: Secondary | ICD-10-CM

## 2011-10-24 MED ORDER — WHITE PETROLATUM GEL
Status: AC
  Administered 2011-10-24: 13:00:00
  Filled 2011-10-24: qty 5
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
## 2011-10-24 NOTE — Progress Notes (Signed)
Physical Therapy Discharge Summary  Patient Details  Name: April Graves MRN: [id number] Date of Birth: 1955/03/25  Today's Date: 10/24/2011 Time: 8295-6213 Time Calculation (min): 10 min, missed 50 minutes Individual therapy Pt and daughter refused therapy due to pt not having lunch tray yet. Offered to reschedule to a later time but pt and daughter refused stating they have done enough already today. Offered to demonstrate how to bump w/c up step for home entry and they refused to go to gym to practice. Instead, demonstrated in the room and educated on w/c breakdown and how to complete the bump up. Both verbalized understanding stating they can handle it. Offered again to come back in a little but declined.   Patient has met 8 of 9 long term goals due to improved balance, decreased pain and ability to compensate for deficits.  Patient to discharge at a wheelchair level Supervision and min A with short distance ambulation with RW.   Patient's daughter is independent to provide the necessary physical assistance at discharge. Have return demonstrated basic transfers, standing balance, car transfer, and education on w/c parts management. Pt is able to complete goals at min A/S level when given time. Daughter often over assists patient and has been educated on appropriate body mechanics. Pt and daughter have been reluctant to learn techniques available to increase independence for pt. Pt also refusing to wear ASOs and KI's during standing/gait activities despite repetitive education.   Reasons goals not met: Pt with limited participation and therapist has not been able to view bed mobility. Per report from other staff required max A.  Recommendation:  Patient will benefit from ongoing skilled PT services in home health setting to continue to advance safe functional mobility, address ongoing impairments in gait, balance, functional mobility, strength, endurance, and minimize fall  risk.  Equipment: 16x16 w/c with basic cushion, RW  Reasons for discharge: discharge from hospital  Patient/family agrees with progress made and goals achieved: Yes  PT Discharge Precautions/Restrictions Precautions Precautions: Fall Required Braces or Orthoses: Knee Immobilizer - Right;Knee Immobilizer - Left;Other Brace/Splint Knee Immobilizer - Right: Other (comment) (when walking) Knee Immobilizer - Left: Other (comment) (when walking) Restrictions Weight Bearing Restrictions: Yes RLE Weight Bearing: Weight bearing as tolerated LLE Weight Bearing: Weight bearing as tolerated Pain No complaints. Vision/Perception  Vision - History Baseline Vision: Wears glasses all the time Patient Visual Report: No change from baseline Vision - Assessment Eye Alignment: Within Functional Limits Perception Perception: Within Functional Limits Praxis Praxis: Intact  Cognition Overall Cognitive Status: Appears within functional limits for tasks assessed Arousal/Alertness: Awake/alert Orientation Level: Oriented X4 Attention: Selective Selective Attention: Impaired Memory: Impaired Awareness: Impaired Problem Solving: Impaired Behaviors: Impulsive Safety/Judgment: Impaired Sensation Sensation Light Touch: Appears Intact Coordination Gross Motor Movements are Fluid and Coordinated: No Fine Motor Movements are Fluid and Coordinated: Yes Motor  Motor Motor: Within Functional Limits    Locomotion  Ambulation Ambulation/Gait Assistance: 5: Supervision  Trunk/Postural Assessment  Cervical Assessment Cervical Assessment: Within Functional Limits Thoracic Assessment Thoracic Assessment: Within Functional Limits Lumbar Assessment Lumbar Assessment: Within Functional Limits  Balance Balance Balance Assessed: Yes Static Sitting Balance Static Sitting - Level of Assistance: 6: Modified independent (Device/Increase time) Dynamic Sitting Balance Dynamic Sitting - Level of  Assistance: 6: Modified independent (Device/Increase time) Static Standing Balance Static Standing - Level of Assistance: 5: Stand by assistance Dynamic Standing Balance Dynamic Standing - Level of Assistance: 5: Stand by assistance;4: Min assist Extremity Assessment  RUE Assessment RUE Assessment: Within Functional  Limits LUE Assessment LUE Assessment: Exceptions to [facility] LUE Strength LUE Overall Strength: Deficits LUE Overall Strength Comments: same as admission RLE Assessment RLE Assessment:  (same as admission) LLE Assessment LLE Assessment:  (same as admission)  See FIM for current functional status  Karolee Stamps [facility] 10/24/2011, 1:35 PM

## 2011-10-24 NOTE — Plan of Care (Signed)
Problem: RH BLADDER ELIMINATION Goal: RH STG MANAGE BLADDER WITH EQUIPMENT WITH ASSISTANCE STG Manage Bladder With Equipment With Min Assistance  Outcome: Progressing Daughter assist patient to and from [facility]

## 2011-10-24 NOTE — Plan of Care (Signed)
Problem: RH BOWEL ELIMINATION Goal: RH STG MANAGE BOWEL W/MEDICATION W/ASSISTANCE STG Manage Bowel with Medication with Min Assistance.  Outcome: Not Progressing Patient refusing senna, changed to Miralax on Tuesday, refused Miralax today and doctor discontinued medication at patient's request

## 2011-10-24 NOTE — Plan of Care (Signed)
Problem: RH Bed Mobility Goal: LTG Patient will perform bed mobility with assist (PT) LTG: Patient will perform bed mobility with assistance, with/without cues (PT).  Outcome: Adequate for Discharge Have not observed pt perform bed mobility due to limited participation and daughter assisting patient.

## 2011-10-24 NOTE — Progress Notes (Signed)
Subjective/Complaints: Good weekend generally. Left shoulder better.stomach still upset at times. Thinks it the bp meds.   Objective: Vital Signs: Blood pressure 134/90, pulse 91, temperature 98.2 F (36.8 C), temperature source Oral, resp. rate 20, weight 63.186 kg (139 lb 4.8 oz), SpO2 98.00%. No results found.  Basename 10/22/11 0505  WBC 13.3*  HGB 11.3*  HCT 35.6*  PLT 654*    Basename 10/22/11 0505  NA 133*  K 4.6  CL 99  CO2 26  GLUCOSE 117*  BUN 24*  CREATININE 0.42*  CALCIUM 10.2   CBG (last 3)  No results found for this basename: GLUCAP:3 in the last 72 hours  Wt Readings from Last 3 Encounters:  10/20/11 63.186 kg (139 lb 4.8 oz)  10/15/11 68.7 kg (151 lb 7.3 oz)  10/15/11 68.7 kg (151 lb 7.3 oz)    Physical Exam:  Nursing note and vitals reviewed.  Constitutional: She is oriented to person, place, and time. She appears well-developed and well-nourished.  HENT:  Head: Normocephalic.  Eyes: EOM are normal. Pupils are equal, round, and reactive to light.  Neck: Normal range of motion.  Cardiovascular: Normal rate and regular rhythm.  Pulmonary/Chest: Effort normal and breath sounds normal.  Abdominal: Soft.  Musculoskeletal:  Left shoulder with pain in ER, IR, and flexion. Can abduct and flex the shoulder to about  40 degrees but with pain.  Left knee is less tender to touch, minimal swelling. Pain with SL extension and with flexion at the knee. Some swelling at right knee also.  Strength is generally functional where not affected by ortho issues. She can lift the left leg partially against gravity. Neurological: She is alert and oriented to person, place, and time.  Skin: Skin is dry.  Psychiatric: She has a normal mood and affect. Her behavior is normal. Judgment and thought content normal   Assessment/Plan: 1. Functional deficits secondary to left tibial plateau and fibular head/neck fx's, left RTC injury which require 3+ hours per day of  interdisciplinary therapy in a comprehensive inpatient rehab setting. Physiatrist is providing close team supervision and 24 hour management of active medical problems listed below. Physiatrist and rehab team continue to assess barriers to discharge/monitor patient progress toward functional and medical goals.  Home tomorrow after family ed. FIM: FIM - Bathing Bathing Steps Patient Completed: Chest;Right Arm;Left Arm;Abdomen;Right upper leg;Left upper leg;Right lower leg (including foot);Left lower leg (including foot) Bathing: 4: Min-Patient completes 8-9 89f 10 parts or 75+ percent  FIM - Upper Body Dressing/Undressing Upper body dressing/undressing steps patient completed: Thread/unthread right sleeve of pullover shirt/dresss;Thread/unthread left sleeve of pullover shirt/dress;Put head through opening of pull over shirt/dress;Pull shirt over trunk (declined to remove bra) Upper body dressing/undressing: 5: Set-up assist to: Obtain clothing/put away FIM - Lower Body Dressing/Undressing Lower body dressing/undressing steps patient completed: Thread/unthread right pants leg;Thread/unthread left pants leg;Don/Doff right sock;Don/Doff left sock;Don/Doff right shoe;Don/Doff left shoe;Fasten/unfasten right shoe;Fasten/unfasten left shoe Lower body dressing/undressing: 4: Min-Patient completed 75 plus % of tasks  FIM - Toileting Toileting: 0: Activity did not occur  FIM - Archivist Transfers: 0-Activity did not occur  FIM - Banker Devices: Therapist, occupational: 2: Chair or W/C > Bed: Max A (lift and lower assist);2: Bed > Chair or W/C: Max A (lift and lower assist)  FIM - Locomotion: Wheelchair Distance: 45' Locomotion: Wheelchair: 2: Travels 50 - 149 ft with supervision, cueing or coaxing FIM - Locomotion: Ambulation Locomotion: Ambulation Assistive Devices: Designer, industrial/product  Ambulation/Gait Assistance: 4: Min guard Locomotion:  Ambulation: 2: Travels 50 - 149 ft with minimal assistance (Pt.>75%)  Comprehension Comprehension Mode: Auditory Comprehension: 4-Understands basic 75 - 89% of the time/requires cueing 10 - 24% of the time  Expression Expression Mode: Verbal Expression: 4-Expresses basic 75 - 89% of the time/requires cueing 10 - 24% of the time. Needs helper to occlude trach/needs to repeat words.  Social Interaction Social Interaction: 5-Interacts appropriately 90% of the time - Needs monitoring or encouragement for participation or interaction.  Problem Solving Problem Solving: 4-Solves basic 75 - 89% of the time/requires cueing 10 - 24% of the time  Memory Memory: 4-Recognizes or recalls 75 - 89% of the time/requires cueing 10 - 24% of the time  Medical Problem List and Plan:  1. DVT Prophylaxis/Anticoagulation: will add Pharmaceutical: Lovenox  2. Pain Management: prn vicodin   -injection helped. Needs to be reasonable with activity 3. Mood: team will provide egosupport as appropriate. She seems to be in good spirits and generally motivated.  4. Neuropsych: This patient is capable of making decisions on his/her own behalf.  5. HTN: monitor with bid checks. Continue to encourage patient to be compliant with medications. Continue lisinopril, metoprolol and amlodipine. May need to adjust meds upward slightly but pt is wary of doins o 6. DJD left shoulder:   voltaren gel for pain management. See above. 7. Leukocytosis- urine culture negative   LOS (Days) 5 A FACE TO FACE EVALUATION WAS PERFORMED  Joncarlos Atkison T 10/24/2011, 8:16 AM

## 2011-10-24 NOTE — Plan of Care (Signed)
Problem: RH BOWEL ELIMINATION Goal: RH STG MANAGE BOWEL W/EQUIPMENT W/ASSISTANCE STG Manage Bowel With Equipment With Min Assistance  Outcome: Progressing Daughter assist mother to and from [facility]

## 2011-10-24 NOTE — Plan of Care (Signed)
Problem: RH BOWEL ELIMINATION Goal: RH STG MANAGE BOWEL WITH ASSISTANCE STG Manage Bowel with Min Assistance.  Outcome: Progressing On senna tabs

## 2011-10-24 NOTE — Progress Notes (Signed)
Physical Therapy Session Note  Patient Details  Name: April Graves MRN: [id number] Date of Birth: 09-29-1955  Today's Date: 10/24/2011 Time: 8657-8469 Time Calculation (min): 30 min  Pt scheduled for 730am PT session but refused stating it's too early and she hasn't had breakfast yet. Rescheduled session to 9am. When therapist entered room at 900, pt stated she wanted to wait til after she was done with her medication so "come back in 10 minutes"  Returned at 915. W/c propulsion on unit with supervision and extra time for mobility training and endurance. Pt only agreeable to work on Nustep x 15 min on level 4 for ROM and strengthening. Daughter performing all transfers.      Therapy Documentation Precautions:  Precautions Precautions: Fall Precaution Comments: HYPERTENSION.MONITOR BP ; encourage knee extension Required Braces or Orthoses: Knee Immobilizer - Right;Knee Immobilizer - Left Knee Immobilizer - Right:  (when walking) Knee Immobilizer - Left:  (when walking) Other Brace/Splint:  (Bilateral ankle ASO) Restrictions Weight Bearing Restrictions: No Other Position/Activity Restrictions: WBAT General: Amount of Missed PT Time (min): 15 Minutes Missed Time Reason: Patient unwilling/refused to participate without medical reason Pain:  Premedicated for pain.  See FIM for current functional status  Therapy/Group: Individual Therapy  Karolee Stamps [facility] 10/24/2011, 10:12 AM

## 2011-10-24 NOTE — Progress Notes (Signed)
Occupational Therapy Session Notes & Discharge Summary  Patient Details  Name: April Graves MRN: [id number] Date of Birth: 1955/02/14  Today's Date: 10/24/2011  SESSION NOTES  Session #1 1030-1100 - 30 Minutes Missed 15 Minutes of skilled therapy secondary to nausea; RN aware Individual Therapy No complaints of pain Upon entering room found patient standing at doorway with rolling walker after transferring off BSC with daughter present in room. Patient refused bathing & dressing at this time, stating she already completed it this am. Patient then ambulated out in hallway using rolling walker ~15 feet. After ambulation patient sat in w/c and daughter propelled patient -> ADL apartment for simulated tub/shower transfer on/off tub transfer bench. Discussed discharge planning with patient and daughter regarding bathroom DME. Notified SW that patient needs tub transfer bench and BSC. Patient missed 15 minutes of skilled occupational therapy secondary to nausea; notified RN and gave patient a Sprite.   Session #2 1430-1440 - 10 Minutes Patient missed 20 minutes of skilled occupational therapy secondary to refusal Individual Therapy Patient with complaints of BLE pain Patient found supine in bed with complaints of some nausea and BLE pain. Positioned patient in bed and discussed some d/c planning with patient and patient's daughter.   --------------------------------------------------------------------------------------------------------------  DISCHARGE SUMMARY Patient has met 4 of 9 long term goals. Patient to discharge at overall min assist level.  Patient's care partner (daughter) is independent to provide the necessary physical assistance at discharge.    Reasons goals not met: Patient did not meet 5 of her set long term goals secondary to patient and patient's daughter tend to complete activities/BADLs against therapists advice and patient did not fully participate in therapies.    Recommendation:  Patient will benefit from ongoing skilled OT services in home health setting to continue to advance functional skills in the area of BADL, iADL and Reduce care partner burden.  Equipment: BSC and Tub Transfer Bench  Reasons for discharge: discharge from hospital  Patient/family agrees with progress made and goals achieved: Yes  Precautions/Restrictions  Precautions Precautions: Fall Required Braces or Orthoses: Knee Immobilizer - Right;Knee Immobilizer - Left;Other Brace/Splint Knee Immobilizer - Right: Other (comment) (when walking) Knee Immobilizer - Left: Other (comment) (when walking) Restrictions Weight Bearing Restrictions: Yes RLE Weight Bearing: Weight bearing as tolerated LLE Weight Bearing: Weight bearing as tolerated  Pain Pain Assessment Pain Assessment: 0-10 Pain Score:   8 Pain Type: Acute pain Pain Location: Leg Pain Orientation: Right;Left Pain Descriptors: Aching Pain Intervention(s): RN made aware  ADL - See FIM  Vision/Perception  Vision - History Baseline Vision: Wears glasses all the time Patient Visual Report: No change from baseline Vision - Assessment Eye Alignment: Within Functional Limits Perception Perception: Within Functional Limits Praxis Praxis: Intact   Cognition Overall Cognitive Status: Appears within functional limits for tasks assessed Arousal/Alertness: Awake/alert Orientation Level: Oriented X4 Attention: Selective Selective Attention: Impaired Memory: Impaired Awareness: Impaired Problem Solving: Impaired Behaviors: Impulsive Safety/Judgment: Impaired  Sensation Sensation Light Touch: Appears Intact Coordination Gross Motor Movements are Fluid and Coordinated: No Fine Motor Movements are Fluid and Coordinated: Yes  Motor  Motor Motor: Within Functional Limits  Trunk/Postural Assessment  Cervical Assessment Cervical Assessment: Within Functional Limits Thoracic Assessment Thoracic  Assessment: Within Functional Limits Lumbar Assessment Lumbar Assessment: Within Functional Limits   Balance Balance Balance Assessed: Yes Static Sitting Balance Static Sitting - Level of Assistance: 6: Modified independent (Device/Increase time) Dynamic Sitting Balance Dynamic Sitting - Level of Assistance: 6: Modified independent (Device/Increase time) Static Standing  Balance Static Standing - Level of Assistance: 5: Stand by assistance Dynamic Standing Balance Dynamic Standing - Level of Assistance: 5: Stand by assistance;4: Min assist  Extremity/Trunk Assessment RUE Assessment RUE Assessment: Within Functional Limits LUE Assessment LUE Assessment: Exceptions to [facility] LUE Strength LUE Overall Strength: Deficits LUE Overall Strength Comments: same as admission  See FIM for current functional status  Carry Ortez 10/24/2011, 2:53 PM

## 2011-10-25 MED ORDER — ACETAMINOPHEN 325 MG PO TABS: 650.0000 mg | ORAL_TABLET | Freq: Three times a day (TID) | ORAL | Status: DC

## 2011-10-25 MED ORDER — DICLOFENAC SODIUM 1 % TD GEL: 2.0000 g | Freq: Four times a day (QID) | TRANSDERMAL | Status: DC

## 2011-10-25 MED ORDER — AMLODIPINE BESYLATE 10 MG PO TABS: 10.0000 mg | ORAL_TABLET | Freq: Every day | ORAL | Status: DC

## 2011-10-25 MED ORDER — LISINOPRIL 40 MG PO TABS: 40.0000 mg | ORAL_TABLET | Freq: Every day | ORAL | Status: DC

## 2011-10-25 MED ORDER — POLYSACCHARIDE IRON COMPLEX 150 MG PO CAPS: 150.0000 mg | ORAL_CAPSULE | Freq: Every day | ORAL | Status: DC

## 2011-10-25 NOTE — Progress Notes (Signed)
Social Work Discharge Note  The overall goal for the admission was met for:   Discharge location: Yes - home with daughter to assist  Length of Stay: Yes - 6 days  Discharge activity level: Yes - minimal assistance overall  Home/community participation: Yes  Services provided included: MD, RD, PT, OT, RN, TR, Pharmacy and SW  Financial Services: Medicare  Follow-up services arranged: Home Health: PT via [facility], DME: 16x16 [place] w/c with ELR, Basic cushion, 3n1 commode, tub bench via [facility] and Patient/Family has no preference for HH/DME agencies  Comments (or additional information):  Patient/Family verbalized understanding of follow-up arrangements: Yes  Individual responsible for coordination of the follow-up plan: patient and daughter  Confirmed correct DME delivered: Amada Jupiter 10/25/2011    Kourtney Montesinos

## 2011-10-25 NOTE — Progress Notes (Signed)
Patient discharge to home with son/daughter at 1402.  Discharge instruction provided by Delle Reining, PA.  Patient and family verbalized understanding, no further question asked.  Belonging and patient equipment packed and escorted rehab NT.

## 2011-10-25 NOTE — Progress Notes (Signed)
Subjective/Complaints: Excited to go home.   Objective: Vital Signs: Blood pressure 127/85, pulse 101, temperature 99 F (37.2 C), temperature source Oral, resp. rate 18, weight 60.8 kg (134 lb 0.6 oz), SpO2 98.00%. No results found. No results found for this basename: WBC:2,HGB:2,HCT:2,PLT:2 in the last 72 hours No results found for this basename: NA:2,K:2,CL:2,CO2:2,GLUCOSE:2,BUN:2,CREATININE:2,CALCIUM:2 in the last 72 hours CBG (last 3)  No results found for this basename: GLUCAP:3 in the last 72 hours  Wt Readings from Last 3 Encounters:  10/24/11 60.8 kg (134 lb 0.6 oz)  10/15/11 68.7 kg (151 lb 7.3 oz)  10/15/11 68.7 kg (151 lb 7.3 oz)    Physical Exam:  Nursing note and vitals reviewed.  Constitutional: She is oriented to person, place, and time. She appears well-developed and well-nourished.  HENT:  Head: Normocephalic.  Eyes: EOM are normal. Pupils are equal, round, and reactive to light.  Neck: Normal range of motion.  Cardiovascular: Normal rate and regular rhythm.  Pulmonary/Chest: Effort normal and breath sounds normal.  Abdominal: Soft.  Musculoskeletal:  Left shoulder with pain in ER, IR, and flexion. Can abduct and flex the shoulder to about  40 degrees but with pain.  Left knee is less tender to touch, minimal swelling. Pain with SL extension and with flexion at the knee. Some swelling at right knee also.  Strength is generally functional where not affected by ortho issues. She can lift the left leg partially against gravity. Neurological: She is alert and oriented to person, place, and time.  Skin: Skin is dry.  Psychiatric: She has a normal mood and affect. Her behavior is normal. Judgment and thought content normal   Assessment/Plan: 1. Functional deficits secondary to left tibial plateau and fibular head/neck fx's, left RTC injury which require 3+ hours per day of interdisciplinary therapy in a comprehensive inpatient rehab setting. Physiatrist is  providing close team supervision and 24 hour management of active medical problems listed below. Physiatrist and rehab team continue to assess barriers to discharge/monitor patient progress toward functional and medical goals.  Home today with HH follow up. Reviewed fracture, location, etc. Needs ortho follow up as an outpt. FIM: FIM - Bathing Bathing Steps Patient Completed: Chest;Right Arm;Left Arm;Abdomen;Right upper leg;Left upper leg;Right lower leg (including foot);Left lower leg (including foot) Bathing: 0: Activity did not occur  FIM - Upper Body Dressing/Undressing Upper body dressing/undressing steps patient completed: Thread/unthread right sleeve of pullover shirt/dresss;Thread/unthread left sleeve of pullover shirt/dress;Put head through opening of pull over shirt/dress;Pull shirt over trunk (declined to remove bra) Upper body dressing/undressing: 0: Activity did not occur FIM - Lower Body Dressing/Undressing Lower body dressing/undressing steps patient completed: Thread/unthread right pants leg;Thread/unthread left pants leg;Don/Doff right sock;Don/Doff left sock;Don/Doff right shoe;Don/Doff left shoe;Fasten/unfasten right shoe;Fasten/unfasten left shoe Lower body dressing/undressing: 0: Activity did not occur  FIM - Toileting Toileting: 0: Activity did not occur  FIM - Archivist Transfers: 0-Activity did not occur  FIM - Banker Devices: Therapist, occupational:  (pt's daughter transferring pt)  FIM - Locomotion: Wheelchair Distance: 45' Locomotion: Wheelchair: 2: Travels 50 - 149 ft with supervision, cueing or coaxing FIM - Locomotion: Ambulation Locomotion: Ambulation Assistive Devices: Designer, industrial/product Ambulation/Gait Assistance: 5: Supervision Locomotion: Ambulation: 2: Travels 50 - 149 ft with supervision/safety issues  Comprehension Comprehension Mode: Auditory Comprehension: 4-Understands basic 75 - 89%  of the time/requires cueing 10 - 24% of the time  Expression Expression Mode: Verbal Expression: 4-Expresses basic 75 - 89% of the time/requires  cueing 10 - 24% of the time. Needs helper to occlude trach/needs to repeat words.  Social Interaction Social Interaction: 5-Interacts appropriately 90% of the time - Needs monitoring or encouragement for participation or interaction.  Problem Solving Problem Solving: 4-Solves basic 75 - 89% of the time/requires cueing 10 - 24% of the time  Memory Memory: 4-Recognizes or recalls 75 - 89% of the time/requires cueing 10 - 24% of the time  Medical Problem List and Plan:  1. DVT Prophylaxis/Anticoagulation: will add Pharmaceutical: Lovenox  2. Pain Management: prn vicodin   -injection helped. Needs to be reasonable with activity 3. Mood: team will provide egosupport as appropriate. She seems to be in good spirits and generally motivated.  4. Neuropsych: This patient is capable of making decisions on his/her own behalf.  5. HTN: monitor with bid checks. Continue to encourage patient to be compliant with medications. Continue lisinopril, metoprolol and amlodipine. May need to adjust meds upward slightly but pt is wary of doins o 6. DJD left shoulder:   voltaren gel for pain management. See above. 7. Leukocytosis- urine culture negative   LOS (Days) 6 A FACE TO FACE EVALUATION WAS PERFORMED  Naisha Wisdom T 10/25/2011, 8:52 AM

## 2011-10-29 ENCOUNTER — Telehealth: Payer: Self-pay | Admitting: Physical Medicine & Rehabilitation

## 2011-10-29 NOTE — Telephone Encounter (Signed)
Delay in start of care dur e to patient did not want to see that day.  Another therapist will try tomorrow. FYI

## 2011-11-01 NOTE — Progress Notes (Signed)
Discharge summary [id number]

## 2011-11-02 ENCOUNTER — Telehealth: Payer: Self-pay | Admitting: *Deleted
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
## 2011-11-02 NOTE — Telephone Encounter (Signed)
FYI  Elmer Bales. PT with [facility] called to let Dr Riley Kill know that they had made three attempts to see Ms Heumann and she had declined them all. One more attempt would be made and then they would close the case if she refuses again.

## 2011-11-25 ENCOUNTER — Emergency Department (HOSPITAL_COMMUNITY)
Admission: EM | Admit: 2011-11-25 | Discharge: 2011-11-25 | Disposition: A | Discharge status: Home / Self Care | Payer: Medicare Other | Attending: Emergency Medicine | Admitting: Emergency Medicine

## 2011-11-25 ENCOUNTER — Emergency Department (HOSPITAL_COMMUNITY): Payer: Medicare Other

## 2011-11-25 ENCOUNTER — Encounter (HOSPITAL_COMMUNITY): Payer: Self-pay | Admitting: *Deleted

## 2011-11-25 DIAGNOSIS — R05 Cough: Secondary | ICD-10-CM | POA: Insufficient documentation

## 2011-11-25 DIAGNOSIS — R059 Cough, unspecified: Secondary | ICD-10-CM | POA: Insufficient documentation

## 2011-11-25 DIAGNOSIS — I10 Essential (primary) hypertension: Secondary | ICD-10-CM | POA: Insufficient documentation

## 2011-11-25 DIAGNOSIS — J069 Acute upper respiratory infection, unspecified: Secondary | ICD-10-CM

## 2011-11-25 DIAGNOSIS — R079 Chest pain, unspecified: Secondary | ICD-10-CM | POA: Insufficient documentation

## 2011-11-25 MED ORDER — AMLODIPINE BESYLATE 10 MG PO TABS: 10.0000 mg | ORAL_TABLET | Freq: Every day | ORAL | Status: DC

## 2011-11-25 MED ORDER — AZITHROMYCIN 200 MG/5ML PO SUSR: ORAL | Status: DC
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
## 2011-12-21 ENCOUNTER — Emergency Department (HOSPITAL_COMMUNITY)
Admission: EM | Admit: 2011-12-21 | Discharge: 2011-12-21 | Disposition: A | Discharge status: Home / Self Care | Payer: Medicare Other | Attending: Emergency Medicine | Admitting: Emergency Medicine

## 2011-12-21 ENCOUNTER — Encounter (HOSPITAL_COMMUNITY): Payer: Self-pay | Admitting: *Deleted

## 2011-12-21 DIAGNOSIS — M542 Cervicalgia: Secondary | ICD-10-CM | POA: Insufficient documentation

## 2011-12-21 DIAGNOSIS — M549 Dorsalgia, unspecified: Secondary | ICD-10-CM | POA: Insufficient documentation

## 2011-12-21 DIAGNOSIS — Z79899 Other long term (current) drug therapy: Secondary | ICD-10-CM | POA: Insufficient documentation

## 2011-12-21 DIAGNOSIS — I10 Essential (primary) hypertension: Secondary | ICD-10-CM | POA: Insufficient documentation

## 2011-12-21 DIAGNOSIS — R059 Cough, unspecified: Secondary | ICD-10-CM | POA: Insufficient documentation

## 2011-12-21 DIAGNOSIS — J45909 Unspecified asthma, uncomplicated: Secondary | ICD-10-CM | POA: Insufficient documentation

## 2011-12-21 DIAGNOSIS — R05 Cough: Secondary | ICD-10-CM | POA: Insufficient documentation

## 2011-12-21 DIAGNOSIS — J069 Acute upper respiratory infection, unspecified: Secondary | ICD-10-CM

## 2011-12-21 DIAGNOSIS — R509 Fever, unspecified: Secondary | ICD-10-CM | POA: Insufficient documentation

## 2011-12-21 MED ORDER — NAPROXEN 500 MG PO TABS
500.0000 mg | ORAL_TABLET | Freq: Once | ORAL | Status: AC
  Administered 2011-12-21: 500 mg via ORAL

## 2011-12-21 MED ORDER — PREDNISONE 20 MG PO TABS: 60.0000 mg | ORAL_TABLET | Freq: Every day | ORAL | Status: DC

## 2011-12-21 MED ORDER — NAPROXEN 500 MG PO TABS
ORAL_TABLET | ORAL | Status: AC
  Administered 2011-12-21: 500 mg via ORAL
  Filled 2011-12-21: qty 1

## 2011-12-21 MED ORDER — PREDNISONE 20 MG PO TABS
60.0000 mg | ORAL_TABLET | Freq: Once | ORAL | Status: AC
  Administered 2011-12-21: 60 mg via ORAL
  Filled 2011-12-21: qty 3

## 2011-12-21 MED ORDER — AZITHROMYCIN 250 MG PO TABS: 250.0000 mg | ORAL_TABLET | Freq: Every day | ORAL | Status: DC

## 2011-12-21 MED ORDER — AZITHROMYCIN 250 MG PO TABS
500.0000 mg | ORAL_TABLET | Freq: Once | ORAL | Status: AC
  Administered 2011-12-21: 500 mg via ORAL
  Filled 2011-12-21: qty 2
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
## 2011-12-21 NOTE — ED Notes (Signed)
Pt requesting a different antibiotic for my "throat infection" states "the other medication made me too sick." Pt also c/o neck pain which is chronic.

## 2011-12-22 NOTE — ED Provider Notes (Signed)
Medical screening examination/treatment/procedure(s) were performed by non-physician practitioner and as supervising physician I was immediately available for consultation/collaboration.  Gerhard Munch, MD 12/22/11 0001

## 2012-01-16 ENCOUNTER — Encounter: Payer: Medicare Other | Attending: Physical Medicine & Rehabilitation | Admitting: Physical Medicine & Rehabilitation

## 2012-03-07 ENCOUNTER — Encounter: Payer: Medicare Other | Attending: Physical Medicine & Rehabilitation | Admitting: Physical Medicine & Rehabilitation

## 2012-03-18 ENCOUNTER — Emergency Department (HOSPITAL_COMMUNITY): Payer: Medicare Other

## 2012-03-18 ENCOUNTER — Emergency Department (HOSPITAL_COMMUNITY)
Admission: EM | Admit: 2012-03-18 | Discharge: 2012-03-18 | Disposition: A | Discharge status: Home / Self Care | Payer: Medicare Other | Attending: Emergency Medicine | Admitting: Emergency Medicine

## 2012-03-18 ENCOUNTER — Encounter (HOSPITAL_COMMUNITY): Payer: Self-pay | Admitting: *Deleted

## 2012-03-18 DIAGNOSIS — I10 Essential (primary) hypertension: Secondary | ICD-10-CM | POA: Insufficient documentation

## 2012-03-18 DIAGNOSIS — Z9851 Tubal ligation status: Secondary | ICD-10-CM | POA: Insufficient documentation

## 2012-03-18 DIAGNOSIS — R000 Tachycardia, unspecified: Secondary | ICD-10-CM | POA: Insufficient documentation

## 2012-03-18 DIAGNOSIS — Z79899 Other long term (current) drug therapy: Secondary | ICD-10-CM | POA: Insufficient documentation

## 2012-03-18 DIAGNOSIS — R0789 Other chest pain: Secondary | ICD-10-CM | POA: Insufficient documentation

## 2012-03-18 DIAGNOSIS — Z8739 Personal history of other diseases of the musculoskeletal system and connective tissue: Secondary | ICD-10-CM | POA: Insufficient documentation

## 2012-03-18 DIAGNOSIS — Z7982 Long term (current) use of aspirin: Secondary | ICD-10-CM | POA: Insufficient documentation

## 2012-03-18 DIAGNOSIS — Z8781 Personal history of (healed) traumatic fracture: Secondary | ICD-10-CM | POA: Insufficient documentation

## 2012-03-18 DIAGNOSIS — J45909 Unspecified asthma, uncomplicated: Secondary | ICD-10-CM | POA: Insufficient documentation

## 2012-03-18 DIAGNOSIS — R5381 Other malaise: Secondary | ICD-10-CM | POA: Insufficient documentation

## 2012-03-18 DIAGNOSIS — R1013 Epigastric pain: Secondary | ICD-10-CM | POA: Insufficient documentation

## 2012-03-18 LAB — POCT I-STAT TROPONIN I: Troponin i, poc: 0 ng/mL (ref 0.00–0.08)

## 2012-03-18 LAB — COMPREHENSIVE METABOLIC PANEL
ALT: 14 U/L (ref 0–35)
Albumin: 2.4 g/dL — ABNORMAL LOW (ref 3.5–5.2)
Alkaline Phosphatase: 183 U/L — ABNORMAL HIGH (ref 39–117)
BUN: 9 mg/dL (ref 6–23)
Calcium: 10 mg/dL (ref 8.4–10.5)
Potassium: 4.1 mEq/L (ref 3.5–5.1)
Sodium: 134 mEq/L — ABNORMAL LOW (ref 135–145)
Total Protein: 8.4 g/dL — ABNORMAL HIGH (ref 6.0–8.3)

## 2012-03-18 LAB — CBC
HCT: 29.8 % — ABNORMAL LOW (ref 36.0–46.0)
Hemoglobin: 9.4 g/dL — ABNORMAL LOW (ref 12.0–15.0)
MCH: 23.1 pg — ABNORMAL LOW (ref 26.0–34.0)
MCV: 73.2 fL — ABNORMAL LOW (ref 78.0–100.0)
Platelets: 476 10*3/uL — ABNORMAL HIGH (ref 150–400)
RBC: 4.07 MIL/uL (ref 3.87–5.11)
WBC: 7.5 10*3/uL (ref 4.0–10.5)

## 2012-03-18 LAB — BASIC METABOLIC PANEL
BUN: 10 mg/dL (ref 6–23)
CO2: 24 mEq/L (ref 19–32)
Calcium: 9.9 mg/dL (ref 8.4–10.5)
Chloride: 100 mEq/L (ref 96–112)
Creatinine, Ser: 0.3 mg/dL — ABNORMAL LOW (ref 0.50–1.10)
Glucose, Bld: 101 mg/dL — ABNORMAL HIGH (ref 70–99)

## 2012-03-18 LAB — TROPONIN I: Troponin I: <0.3 ng/mL

## 2012-03-18 LAB — D-DIMER, QUANTITATIVE: D-Dimer, Quant: >20 ug/mL-FEU — ABNORMAL HIGH (ref 0.00–0.48)

## 2012-03-18 MED ORDER — MORPHINE SULFATE 4 MG/ML IJ SOLN
4.0000 mg | Freq: Once | INTRAMUSCULAR | Status: AC
  Administered 2012-03-18: 4 mg via INTRAVENOUS
  Filled 2012-03-18: qty 1

## 2012-03-18 MED ORDER — IOHEXOL 350 MG/ML SOLN
100.0000 mL | Freq: Once | INTRAVENOUS | Status: AC | PRN
  Administered 2012-03-18: 100 mL via INTRAVENOUS

## 2012-03-18 MED ORDER — TRAMADOL HCL 50 MG PO TABS: 50.0000 mg | ORAL_TABLET | Freq: Four times a day (QID) | ORAL | Status: DC | PRN

## 2012-03-18 MED ORDER — GI COCKTAIL ~~LOC~~
30.0000 mL | Freq: Once | ORAL | Status: AC
  Administered 2012-03-18: 30 mL via ORAL
  Filled 2012-03-18: qty 30

## 2012-03-18 MED ORDER — PANTOPRAZOLE SODIUM 40 MG IV SOLR
40.0000 mg | Freq: Once | INTRAVENOUS | Status: AC
  Administered 2012-03-18: 40 mg via INTRAVENOUS
  Filled 2012-03-18: qty 40

## 2012-03-18 MED ORDER — LANSOPRAZOLE 30 MG PO CPDR: 30.0000 mg | DELAYED_RELEASE_CAPSULE | Freq: Every day | ORAL | Status: DC

## 2012-03-18 MED ORDER — SODIUM CHLORIDE 0.9 % IV BOLUS (SEPSIS)
1000.0000 mL | Freq: Once | INTRAVENOUS | Status: AC
  Administered 2012-03-18: 1000 mL via INTRAVENOUS

## 2012-03-18 NOTE — ED Notes (Signed)
Pt alert and oriented x4. Respirations even and unlabored, bilateral symmetrical rise and fall of chest. Skin warm and dry. In no acute distress. Denies needs.   
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
## 2012-03-18 NOTE — ED Notes (Signed)
Pt to CT

## 2012-06-04 ENCOUNTER — Encounter: Payer: Medicare Other | Attending: Physical Medicine & Rehabilitation | Admitting: Physical Medicine & Rehabilitation

## 2012-06-04 ENCOUNTER — Encounter: Payer: Self-pay | Admitting: Physical Medicine & Rehabilitation

## 2012-06-04 VITALS — BP 133/76 | HR 102 | Resp 14 | Ht 64.0 in | Wt 155.0 lb

## 2012-06-04 DIAGNOSIS — M069 Rheumatoid arthritis, unspecified: Secondary | ICD-10-CM

## 2012-06-04 DIAGNOSIS — S82141D Displaced bicondylar fracture of right tibia, subsequent encounter for closed fracture with routine healing: Secondary | ICD-10-CM

## 2012-06-04 DIAGNOSIS — M109 Gout, unspecified: Secondary | ICD-10-CM

## 2012-06-04 DIAGNOSIS — M719 Bursopathy, unspecified: Secondary | ICD-10-CM | POA: Insufficient documentation

## 2012-06-04 DIAGNOSIS — M25579 Pain in unspecified ankle and joints of unspecified foot: Secondary | ICD-10-CM | POA: Insufficient documentation

## 2012-06-04 DIAGNOSIS — M25519 Pain in unspecified shoulder: Secondary | ICD-10-CM | POA: Insufficient documentation

## 2012-06-04 DIAGNOSIS — M549 Dorsalgia, unspecified: Secondary | ICD-10-CM | POA: Insufficient documentation

## 2012-06-04 DIAGNOSIS — Z8781 Personal history of (healed) traumatic fracture: Secondary | ICD-10-CM | POA: Insufficient documentation

## 2012-06-04 DIAGNOSIS — M67919 Unspecified disorder of synovium and tendon, unspecified shoulder: Secondary | ICD-10-CM | POA: Insufficient documentation

## 2012-06-04 DIAGNOSIS — S8290XD Unspecified fracture of unspecified lower leg, subsequent encounter for closed fracture with routine healing: Secondary | ICD-10-CM

## 2012-06-04 DIAGNOSIS — R9389 Abnormal findings on diagnostic imaging of other specified body structures: Secondary | ICD-10-CM | POA: Insufficient documentation
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
## 2012-06-04 NOTE — Patient Instructions (Signed)
CALL ME WITH ANY PROBLEMS OR QUESTIONS (#[phone]).  HAVE A GOOD DAY  

## 2013-01-29 IMAGING — CT CT KNEE*R* W/O CM
3 series · 16 of 33 positions shown, 19 images · non-contrast
Comparison: Radiographs 10/14/2011.

CLINICAL DATA: Knee pain status post fall.  Evaluate for occult
tibial plateau fracture.

CT OF THE RIGHT KNEE WITHOUT CONTRAST
TECHNIQUE: Multidetector CT imaging was performed according to the
standard protocol. Multiplanar CT image reconstructions were also
generated.

[Series 4: knee tib/fib 3.0 b40s · axial · 0.40mm/px · z∈[+1013,+1142]mm · 8 of 51 slices shown, 10 images]
[im 4/51  soft-tissue]
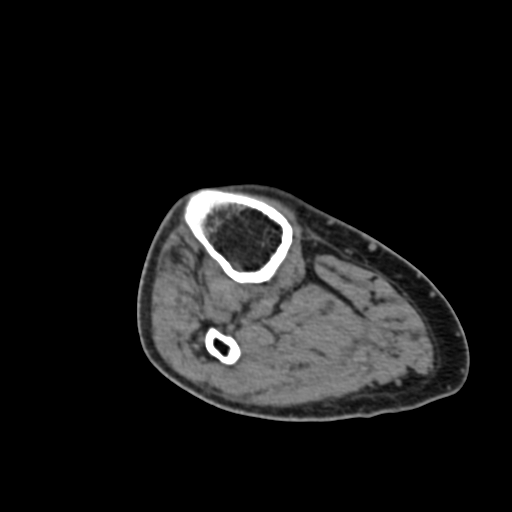
[im 4/51  bone]
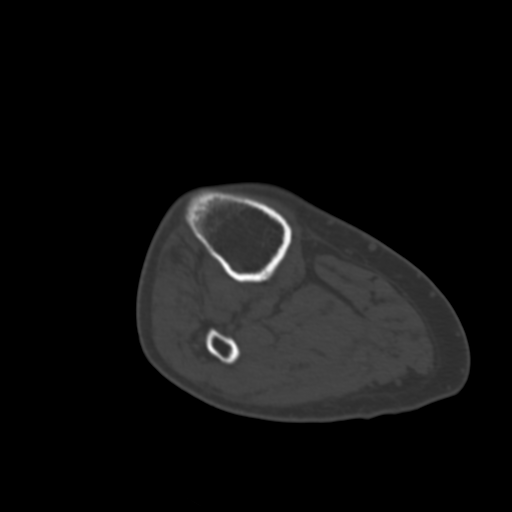
[im 12/51  bone]
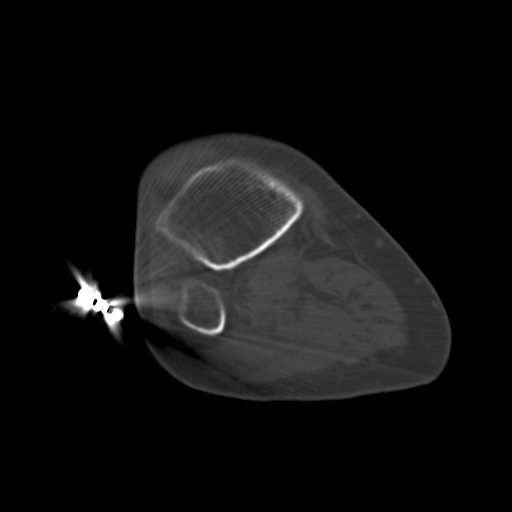
[im 16/51  bone]
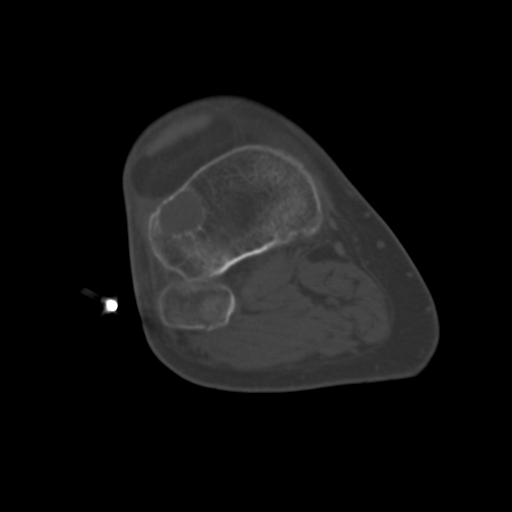
[im 24/51  bone]
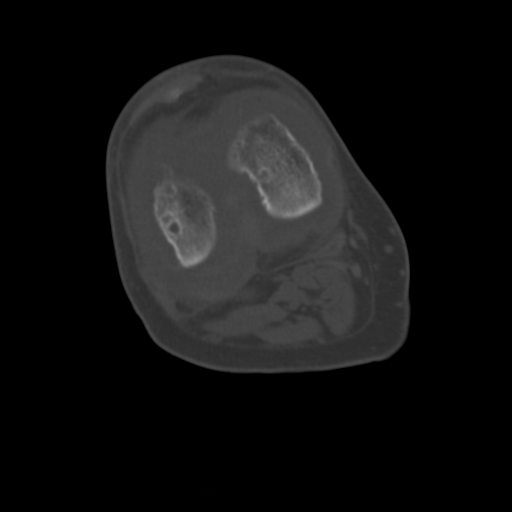
[im 27/51  soft-tissue]
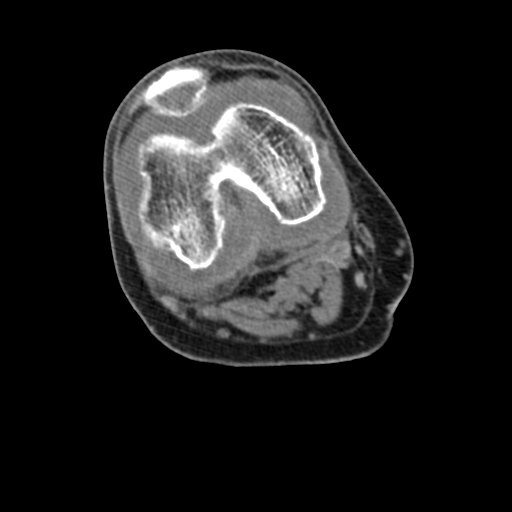
[im 27/51  bone]
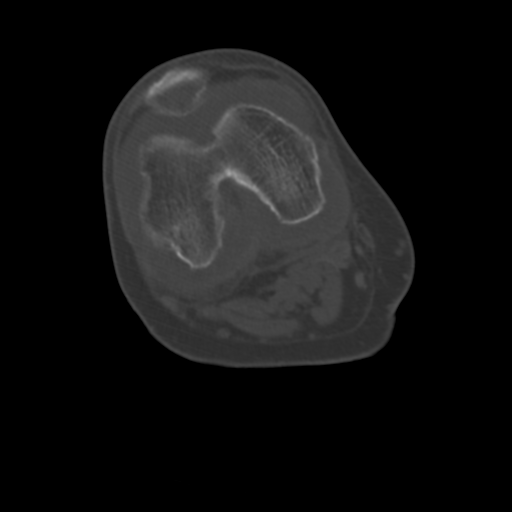
[im 35/51  bone]
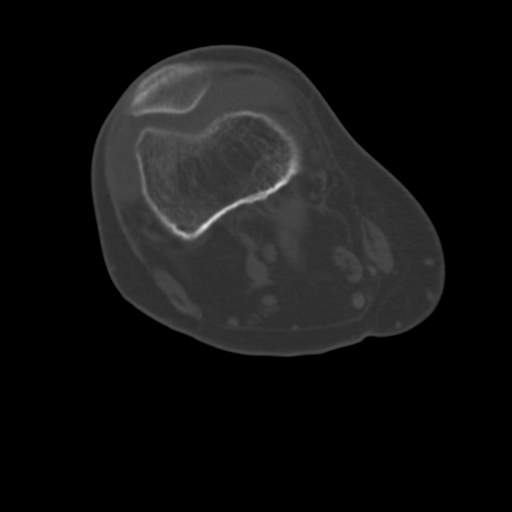
[im 39/51  bone]
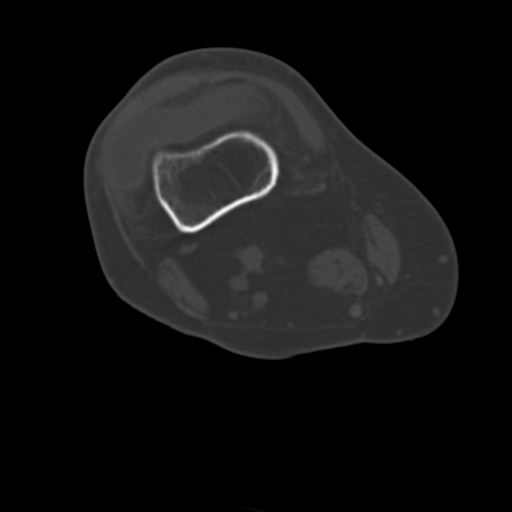
[im 47/51  bone]
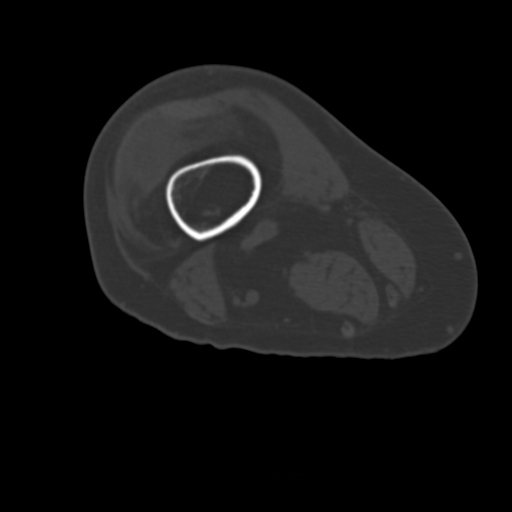

[Series 602: cor · coronal · 0.40mm/px · 3 of 66 slices shown]
[im 14/66  bone]
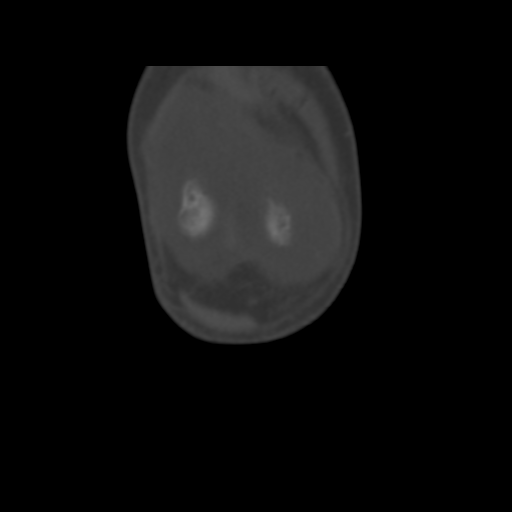
[im 27/66  bone]
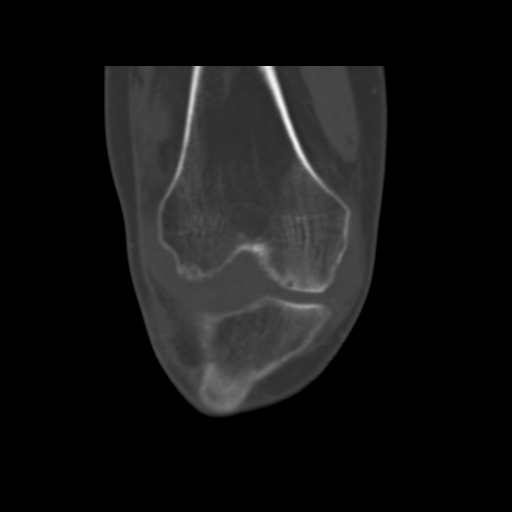
[im 40/66  bone]
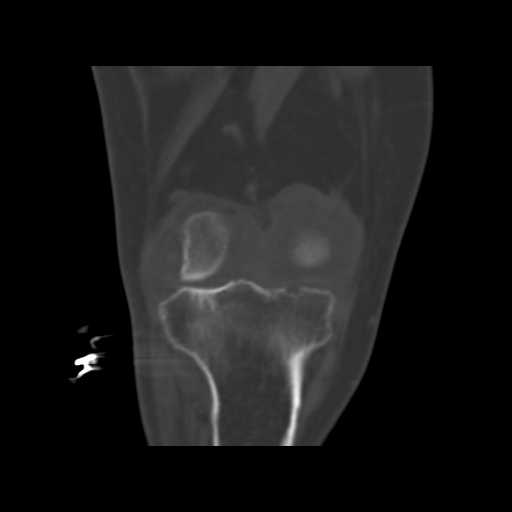

[Series 603: sag · sagittal · 0.40mm/px · 5 of 66 slices shown, 6 images]
[im 22/66  bone]
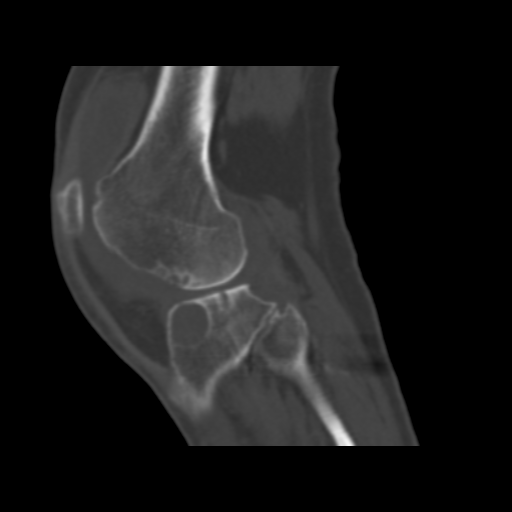
[im 28/66  bone]
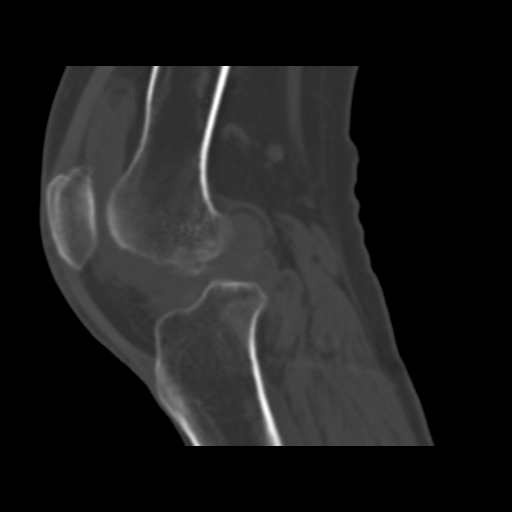
[im 33/66  soft-tissue]
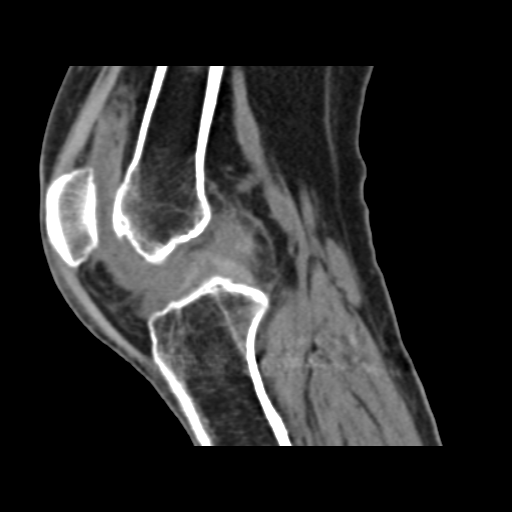
[im 33/66  bone]
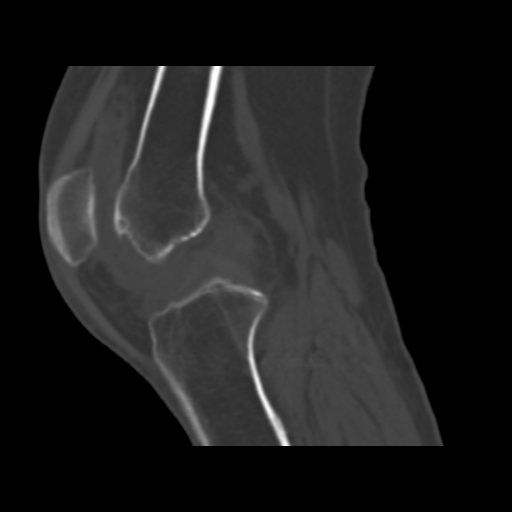
[im 38/66  bone]
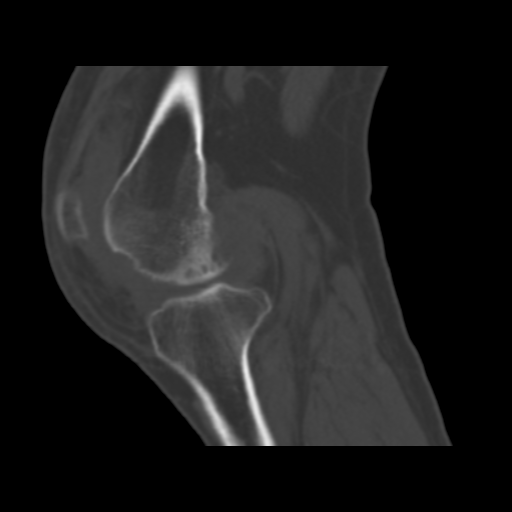
[im 44/66  bone]
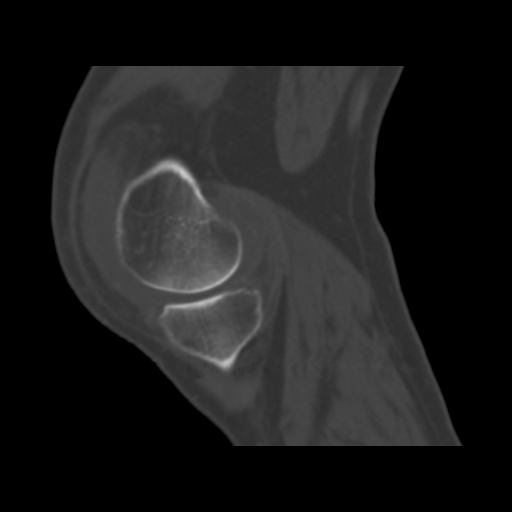

[16 of 33 positions shown; findings below may reference images not displayed]

FINDINGS: There is a moderate sized hemarthrosis.  No layering
intra-articular fat is demonstrated.  There is generalized
osteopenia.  There are tricompartmental degenerative changes with
joint space loss and subchondral cyst formation in both femoral
condyles and tibial plateaus.  A dominant 2.1 cm subchondral cyst
anteriorly in the lateral tibial plateau has sclerotic margins and
is likely a geode.

No cortical fracture or depressed tibial plateau fracture is
demonstrated.  There is no dislocation.

The extensor mechanism is intact.  As evaluated by CT, the cruciate
ligaments appear intact.
IMPRESSION: 1.  No CT evidence of acute fracture or dislocation.
2.  Tricompartmental degenerative changes with subchondral cyst
formation, most pronounced anteriorly in the lateral tibial
plateau.
3.  Moderate sized hemarthrosis.

If the patient fails to respond to conservative therapy, follow-up
MRI may be helpful to exclude occult fracture or internal
derangement.

## 2013-01-30 IMAGING — RF DG KNEE 3 VIEWS*L*
1 series · 2 of 2 positions shown · non-contrast
Comparison: CT on 10/15/2011

CLINICAL DATA: Closed reduction of fibular fracture.

LEFT KNEE - 3 VIEW

[Series 1: run · 2 of 2 slices shown]
[im 1/2]
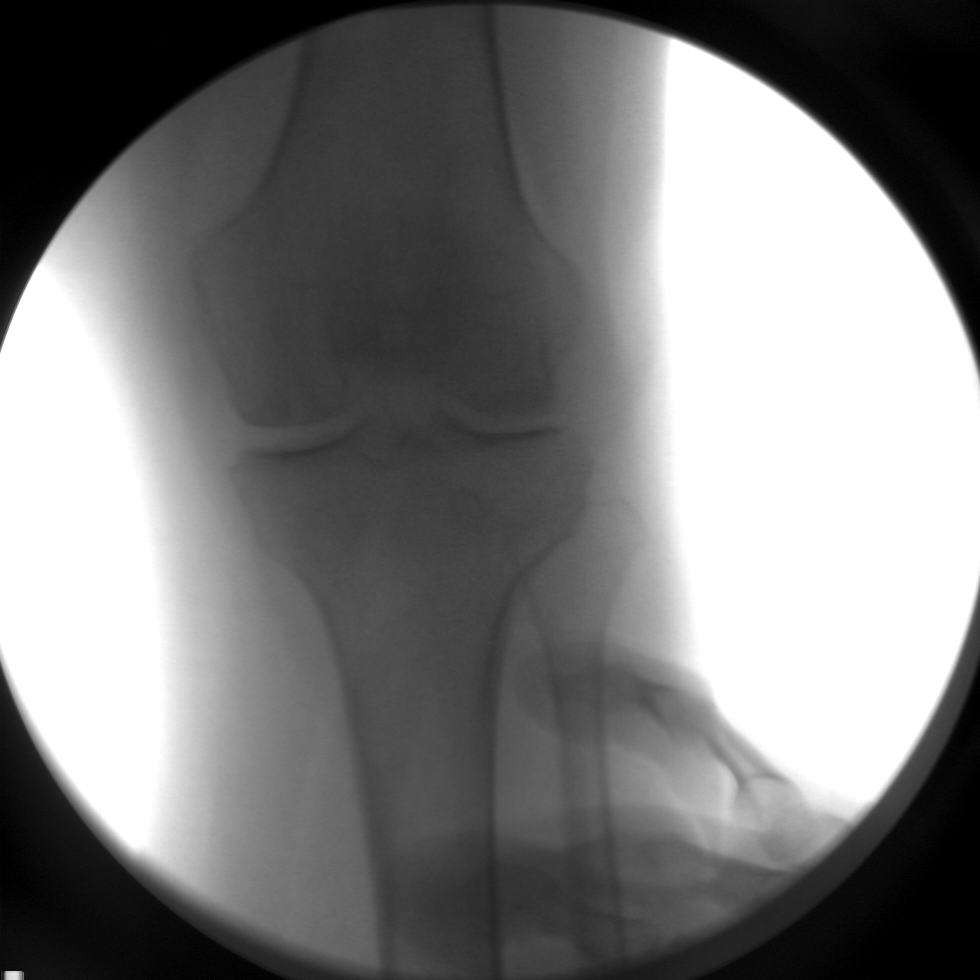
[im 2/2]
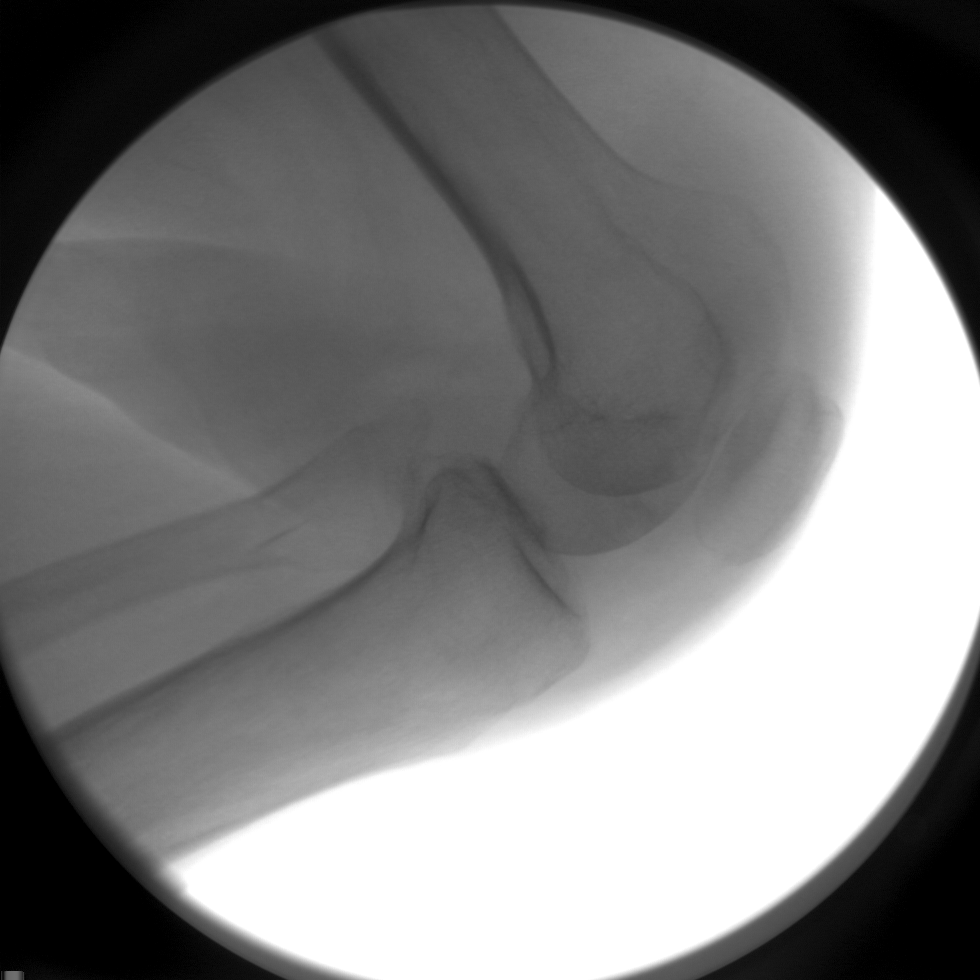

[2 of 2 positions shown; findings below may reference images not displayed]

FINDINGS: Proximal fibular fracture is seen, and is seen in near
anatomic alignment.  No other fractures identified.
IMPRESSION: Proximal fibular fracture in near anatomic alignment.

## 2013-06-11 ENCOUNTER — Ambulatory Visit: Payer: Self-pay | Admitting: Podiatry

## 2013-09-14 ENCOUNTER — Ambulatory Visit: Payer: Self-pay | Admitting: Podiatry

## 2013-09-24 ENCOUNTER — Ambulatory Visit: Payer: Self-pay | Admitting: Podiatry

## 2013-12-10 ENCOUNTER — Ambulatory Visit: Payer: Self-pay | Admitting: Podiatry

## 2013-12-17 ENCOUNTER — Ambulatory Visit: Payer: Self-pay | Admitting: Podiatry

## 2014-01-14 ENCOUNTER — Encounter (HOSPITAL_COMMUNITY): Payer: Self-pay | Admitting: Emergency Medicine

## 2014-01-14 ENCOUNTER — Telehealth (HOSPITAL_COMMUNITY): Payer: Self-pay | Admitting: Family Medicine

## 2014-01-14 ENCOUNTER — Emergency Department (INDEPENDENT_AMBULATORY_CARE_PROVIDER_SITE_OTHER)
Admission: EM | Admit: 2014-01-14 | Discharge: 2014-01-14 | Disposition: A | Discharge status: Home / Self Care | Payer: Medicare Other | Source: Home / Self Care | Attending: Family Medicine | Admitting: Family Medicine

## 2014-01-14 DIAGNOSIS — M79605 Pain in left leg: Secondary | ICD-10-CM | POA: Diagnosis not present

## 2014-01-14 DIAGNOSIS — G8929 Other chronic pain: Secondary | ICD-10-CM

## 2014-01-14 DIAGNOSIS — W19XXXS Unspecified fall, sequela: Secondary | ICD-10-CM | POA: Diagnosis not present

## 2014-01-14 DIAGNOSIS — M25579 Pain in unspecified ankle and joints of unspecified foot: Secondary | ICD-10-CM | POA: Diagnosis not present

## 2014-01-14 MED ORDER — TRAMADOL HCL 50 MG PO TABS: 50.0000 mg | ORAL_TABLET | Freq: Four times a day (QID) | ORAL | Status: DC | PRN

## 2014-01-14 NOTE — Discharge Instructions (Signed)
Ankle Pain Ankle pain is a common symptom. The bones, cartilage, tendons, and muscles of the ankle joint perform a lot of work each day. The ankle joint holds your body weight and allows you to move around. Ankle pain can occur on either side or back of 1 or both ankles. Ankle pain may be sharp and burning or dull and aching. There may be tenderness, stiffness, redness, or warmth around the ankle. The pain occurs more often when a person walks or puts pressure on the ankle. CAUSES  There are many reasons ankle pain can develop. It is important to work with your caregiver to identify the cause since many conditions can impact the bones, cartilage, muscles, and tendons. Causes for ankle pain include:  Injury, including a break (fracture), sprain, or strain often due to a fall, sports, or a high-impact activity.  Swelling (inflammation) of a tendon (tendonitis).  Achilles tendon rupture.  Ankle instability after repeated sprains and strains.  Poor foot alignment.  Pressure on a nerve (tarsal tunnel syndrome).  Arthritis in the ankle or the lining of the ankle.  Crystal formation in the ankle (gout or pseudogout). DIAGNOSIS  A diagnosis is based on your medical history, your symptoms, results of your physical exam, and results of diagnostic tests. Diagnostic tests may include X-ray exams or a computerized magnetic scan (magnetic resonance imaging, MRI). TREATMENT  Treatment will depend on the cause of your ankle pain and may include:  Keeping pressure off the ankle and limiting activities.  Using crutches or other walking support (a cane or brace).  Using rest, ice, compression, and elevation.  Participating in physical therapy or home exercises.  Wearing shoe inserts or special shoes.  Losing weight.  Taking medications to reduce pain or swelling or receiving an injection.  Undergoing surgery. HOME CARE INSTRUCTIONS   Only take over-the-counter or prescription medicines for  pain, discomfort, or fever as directed by your caregiver.  Put ice on the injured area.  Put ice in a plastic bag.  Place a towel between your skin and the bag.  Leave the ice on for 15-20 minutes at a time, 03-04 times a day.  Keep your leg raised (elevated) when possible to lessen swelling.  Avoid activities that cause ankle pain.  Follow specific exercises as directed by your caregiver.  Record how often you have ankle pain, the location of the pain, and what it feels like. This information may be helpful to you and your caregiver.  Ask your caregiver about returning to work or sports and whether you should drive.  Follow up with your caregiver for further examination, therapy, or testing as directed. SEEK MEDICAL CARE IF:   Pain or swelling continues or worsens beyond 1 week.  You have an oral temperature above 102 F (38.9 C).  You are feeling unwell or have chills.  You are having an increasingly difficult time with walking.  You have loss of sensation or other new symptoms.  You have questions or concerns. MAKE SURE YOU:   Understand these instructions.  Will watch your condition.  Will get help right away if you are not doing well or get worse. Document Released: 07/12/2009 Document Revised: 04/16/2011 Document Reviewed: 07/12/2009 [facility] Patient Information 2015 [place], [place]. This information is not intended to replace advice given to you by your health care provider. Make sure you discuss any questions you have with your health care provider.  Arthralgia Arthralgia is joint pain. A joint is a place where two bones  meet. Joint pain can happen for many reasons. The joint can be bruised, stiff, infected, or weak from aging. Pain usually goes away after resting and taking medicine for soreness.  HOME CARE  Rest the joint as told by your doctor.  Keep the sore joint raised (elevated) for the first 24 hours.  Put ice on the joint area.  Put ice in a  plastic bag.  Place a towel between your skin and the bag.  Leave the ice on for 15-20 minutes, 03-04 times a day.  Wear your splint, casting, elastic bandage, or sling as told by your doctor.  Only take medicine as told by your doctor. Do not take aspirin.  Use crutches as told by your doctor. Do not put weight on the joint until told to by your doctor. GET HELP RIGHT AWAY IF:   You have bruising, puffiness (swelling), or more pain.  Your fingers or toes turn blue or start to lose feeling (numb).  Your medicine does not lessen the pain.  Your pain becomes severe.  You have a temperature by mouth above 102 F (38.9 C), not controlled by medicine.  You cannot move or use the joint. MAKE SURE YOU:   Understand these instructions.  Will watch your condition.  Will get help right away if you are not doing well or get worse. Document Released: 01/10/2009 Document Revised: 04/16/2011 Document Reviewed: 01/10/2009 [facility] Patient Information 2015 [place], [place]. This information is not intended to replace advice given to you by your health care provider. Make sure you discuss any questions you have with your health care provider.  Chronic Pain Chronic pain can be defined as pain that is off and on and lasts for 3-6 months or longer. Many things cause chronic pain, which can make it difficult to make a diagnosis. There are many treatment options available for chronic pain. However, finding a treatment that works well for you may require trying various approaches until the right one is found. Many people benefit from a combination of two or more types of treatment to control their pain. SYMPTOMS  Chronic pain can occur anywhere in the body and can range from mild to very severe. Some types of chronic pain include:  Headache.  Low back pain.  Cancer pain.  Arthritis pain.  Neurogenic pain. This is pain resulting from damage to nerves. People with chronic pain may also have  other symptoms such as:  Depression.  Anger.  Insomnia.  Anxiety. DIAGNOSIS  Your health care provider will help diagnose your condition over time. In many cases, the initial focus will be on excluding possible conditions that could be causing the pain. Depending on your symptoms, your health care provider may order tests to diagnose your condition. Some of these tests may include:   Blood tests.   CT scan.   MRI.   X-rays.   Ultrasounds.   Nerve conduction studies.  You may need to see a specialist.  TREATMENT  Finding treatment that works well may take time. You may be referred to a pain specialist. He or she may prescribe medicine or therapies, such as:   Mindful meditation or yoga.  Shots (injections) of numbing or pain-relieving medicines into the spine or area of pain.  Local electrical stimulation.  Acupuncture.   Massage therapy.   Aroma, color, light, or sound therapy.   Biofeedback.   Working with a physical therapist to keep from getting stiff.   Regular, gentle exercise.   Cognitive or behavioral  therapy.   Group support.  Sometimes, surgery may be recommended.  HOME CARE INSTRUCTIONS   Take all medicines as directed by your health care provider.   Lessen stress in your life by relaxing and doing things such as listening to calming music.   Exercise or be active as directed by your health care provider.   Eat a healthy diet and include things such as vegetables, fruits, fish, and lean meats in your diet.   Keep all follow-up appointments with your health care provider.   Attend a support group with others suffering from chronic pain. SEEK MEDICAL CARE IF:   Your pain gets worse.   You develop a new pain that was not there before.   You cannot tolerate medicines given to you by your health care provider.   You have new symptoms since your last visit with your health care provider.  SEEK IMMEDIATE MEDICAL CARE IF:    You feel weak.   You have decreased sensation or numbness.   You lose control of bowel or bladder function.   Your pain suddenly gets much worse.   You develop shaking.  You develop chills.  You develop confusion.  You develop chest pain.  You develop shortness of breath.  MAKE SURE YOU:  Understand these instructions.  Will watch your condition.  Will get help right away if you are not doing well or get worse. Document Released: 10/14/2001 Document Revised: 09/24/2012 Document Reviewed: 07/18/2012 [facility] Patient Information 2015 [place], [place]. This information is not intended to replace advice given to you by your health care provider. Make sure you discuss any questions you have with your health care provider.

## 2014-01-14 NOTE — ED Notes (Signed)
Late entry : after initial discharge, was called to the lobby.  Grandson had copy of medical record in his hand, questioning how dates and diagnosis (prior) were documented.  Explained providers review old records-medical record, and discussion with patient and family.  Grandson wanted specific explanation for dates/injuries listed in provider's note.   Informed provider of family members questions.   Clarified with family member and Management consultant did we have a signed/documented instruction from patient to discuss medical record.  Currently no documentation on file.  Family member offered to go get patient and was encouraged to do so. On return to department, provider discussed note with patient and family member.  Patient /family member escorted to discharge
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
## 2014-02-10 ENCOUNTER — Ambulatory Visit: Payer: Medicare Other | Admitting: Podiatry

## 2014-02-12 ENCOUNTER — Ambulatory Visit: Payer: Medicare Other | Admitting: Podiatrist

## 2014-02-12 ENCOUNTER — Ambulatory Visit: Payer: Medicare Other | Admitting: Family Medicine

## 2014-02-16 ENCOUNTER — Ambulatory Visit: Payer: Medicare Other | Admitting: Sports Medicine

## 2014-08-04 ENCOUNTER — Encounter (HOSPITAL_COMMUNITY): Payer: Self-pay | Admitting: *Deleted

## 2014-08-04 ENCOUNTER — Emergency Department (HOSPITAL_COMMUNITY)
Admission: EM | Admit: 2014-08-04 | Discharge: 2014-08-04 | Disposition: A | Discharge status: Home / Self Care | Payer: Medicare Other | Attending: Emergency Medicine | Admitting: Emergency Medicine

## 2014-08-04 ENCOUNTER — Emergency Department (HOSPITAL_COMMUNITY): Payer: Medicare Other

## 2014-08-04 DIAGNOSIS — R2242 Localized swelling, mass and lump, left lower limb: Secondary | ICD-10-CM | POA: Diagnosis not present

## 2014-08-04 DIAGNOSIS — Z87828 Personal history of other (healed) physical injury and trauma: Secondary | ICD-10-CM | POA: Insufficient documentation

## 2014-08-04 DIAGNOSIS — G8929 Other chronic pain: Secondary | ICD-10-CM | POA: Diagnosis not present

## 2014-08-04 DIAGNOSIS — J45909 Unspecified asthma, uncomplicated: Secondary | ICD-10-CM | POA: Insufficient documentation

## 2014-08-04 DIAGNOSIS — M25572 Pain in left ankle and joints of left foot: Secondary | ICD-10-CM | POA: Insufficient documentation

## 2014-08-04 DIAGNOSIS — R600 Localized edema: Secondary | ICD-10-CM

## 2014-08-04 DIAGNOSIS — I10 Essential (primary) hypertension: Secondary | ICD-10-CM | POA: Diagnosis not present

## 2014-08-04 DIAGNOSIS — Z8781 Personal history of (healed) traumatic fracture: Secondary | ICD-10-CM | POA: Insufficient documentation

## 2014-08-04 DIAGNOSIS — M25552 Pain in left hip: Secondary | ICD-10-CM | POA: Insufficient documentation

## 2014-08-04 MED ORDER — HYDROCODONE-ACETAMINOPHEN 5-325 MG PO TABS: 1.0000 | ORAL_TABLET | Freq: Four times a day (QID) | ORAL | Status: DC | PRN

## 2014-08-04 MED ORDER — HYDROCODONE-ACETAMINOPHEN 5-325 MG PO TABS
1.0000 | ORAL_TABLET | Freq: Once | ORAL | Status: AC
  Administered 2014-08-04: 1 via ORAL
  Filled 2014-08-04: qty 1

## 2014-08-04 MED ORDER — CYCLOBENZAPRINE HCL 10 MG PO TABS: 10.0000 mg | ORAL_TABLET | Freq: Two times a day (BID) | ORAL | Status: DC | PRN

## 2014-08-04 MED ORDER — MELOXICAM 7.5 MG PO TABS: 7.5000 mg | ORAL_TABLET | Freq: Every day | ORAL | Status: DC
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
## 2014-08-04 NOTE — ED Notes (Signed)
Pt stable, ambulatory, states understanding of discharge instructions 
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
## 2014-09-30 ENCOUNTER — Encounter: Payer: Self-pay | Admitting: Cardiovascular Disease

## 2015-03-04 DIAGNOSIS — E11621 Type 2 diabetes mellitus with foot ulcer: Secondary | ICD-10-CM | POA: Diagnosis not present

## 2015-03-07 ENCOUNTER — Encounter (HOSPITAL_COMMUNITY): Payer: Self-pay | Admitting: *Deleted

## 2015-03-07 ENCOUNTER — Emergency Department (HOSPITAL_COMMUNITY): Payer: Commercial Managed Care - HMO

## 2015-03-07 ENCOUNTER — Inpatient Hospital Stay (HOSPITAL_COMMUNITY)
Admission: EM | Admit: 2015-03-07 | Discharge: 2015-03-14 | DRG: 592 | Disposition: A | Discharge status: Skilled Nursing Facility | Payer: Commercial Managed Care - HMO | Attending: Internal Medicine | Admitting: Internal Medicine

## 2015-03-07 DIAGNOSIS — M549 Dorsalgia, unspecified: Secondary | ICD-10-CM | POA: Diagnosis present

## 2015-03-07 DIAGNOSIS — Z9181 History of falling: Secondary | ICD-10-CM

## 2015-03-07 DIAGNOSIS — D649 Anemia, unspecified: Secondary | ICD-10-CM

## 2015-03-07 DIAGNOSIS — L0390 Cellulitis, unspecified: Secondary | ICD-10-CM | POA: Diagnosis not present

## 2015-03-07 DIAGNOSIS — R262 Difficulty in walking, not elsewhere classified: Secondary | ICD-10-CM | POA: Diagnosis present

## 2015-03-07 DIAGNOSIS — L8990 Pressure ulcer of unspecified site, unspecified stage: Secondary | ICD-10-CM

## 2015-03-07 DIAGNOSIS — Z8249 Family history of ischemic heart disease and other diseases of the circulatory system: Secondary | ICD-10-CM

## 2015-03-07 DIAGNOSIS — Z993 Dependence on wheelchair: Secondary | ICD-10-CM

## 2015-03-07 DIAGNOSIS — M25551 Pain in right hip: Secondary | ICD-10-CM | POA: Diagnosis present

## 2015-03-07 DIAGNOSIS — R531 Weakness: Secondary | ICD-10-CM | POA: Diagnosis not present

## 2015-03-07 DIAGNOSIS — M889 Osteitis deformans of unspecified bone: Secondary | ICD-10-CM | POA: Diagnosis present

## 2015-03-07 DIAGNOSIS — L089 Local infection of the skin and subcutaneous tissue, unspecified: Secondary | ICD-10-CM

## 2015-03-07 DIAGNOSIS — J45909 Unspecified asthma, uncomplicated: Secondary | ICD-10-CM | POA: Diagnosis present

## 2015-03-07 DIAGNOSIS — Z79899 Other long term (current) drug therapy: Secondary | ICD-10-CM

## 2015-03-07 DIAGNOSIS — D638 Anemia in other chronic diseases classified elsewhere: Secondary | ICD-10-CM | POA: Diagnosis present

## 2015-03-07 DIAGNOSIS — E876 Hypokalemia: Secondary | ICD-10-CM | POA: Diagnosis present

## 2015-03-07 DIAGNOSIS — Z7401 Bed confinement status: Secondary | ICD-10-CM | POA: Diagnosis not present

## 2015-03-07 DIAGNOSIS — L89224 Pressure ulcer of left hip, stage 4: Principal | ICD-10-CM | POA: Diagnosis present

## 2015-03-07 DIAGNOSIS — I10 Essential (primary) hypertension: Secondary | ICD-10-CM | POA: Diagnosis present

## 2015-03-07 DIAGNOSIS — K029 Dental caries, unspecified: Secondary | ICD-10-CM | POA: Diagnosis not present

## 2015-03-07 DIAGNOSIS — L89323 Pressure ulcer of left buttock, stage 3: Secondary | ICD-10-CM | POA: Diagnosis present

## 2015-03-07 DIAGNOSIS — L89204 Pressure ulcer of unspecified hip, stage 4: Secondary | ICD-10-CM | POA: Diagnosis present

## 2015-03-07 DIAGNOSIS — R532 Functional quadriplegia: Secondary | ICD-10-CM | POA: Diagnosis not present

## 2015-03-07 DIAGNOSIS — L8994 Pressure ulcer of unspecified site, stage 4: Secondary | ICD-10-CM

## 2015-03-07 DIAGNOSIS — E13622 Other specified diabetes mellitus with other skin ulcer: Secondary | ICD-10-CM | POA: Diagnosis not present

## 2015-03-07 DIAGNOSIS — G8929 Other chronic pain: Secondary | ICD-10-CM | POA: Diagnosis present

## 2015-03-07 DIAGNOSIS — L89220 Pressure ulcer of left hip, unstageable: Secondary | ICD-10-CM

## 2015-03-07 DIAGNOSIS — L89324 Pressure ulcer of left buttock, stage 4: Secondary | ICD-10-CM | POA: Diagnosis not present

## 2015-03-07 DIAGNOSIS — M25552 Pain in left hip: Secondary | ICD-10-CM | POA: Diagnosis present

## 2015-03-07 DIAGNOSIS — L89894 Pressure ulcer of other site, stage 4: Secondary | ICD-10-CM | POA: Diagnosis not present

## 2015-03-07 DIAGNOSIS — E11621 Type 2 diabetes mellitus with foot ulcer: Secondary | ICD-10-CM | POA: Diagnosis not present

## 2015-03-07 LAB — MAGNESIUM: MAGNESIUM: 1.9 mg/dL (ref 1.7–2.4)

## 2015-03-07 LAB — CBC WITH DIFFERENTIAL/PLATELET
BASOS PCT: 0 %
Basophils Absolute: 0 10*3/uL (ref 0.0–0.1)
Basophils Absolute: 0 10*3/uL (ref 0.0–0.1)
Basophils Relative: 0 %
EOS ABS: 0.1 10*3/uL (ref 0.0–0.7)
EOS PCT: 0 %
Eosinophils Absolute: 0 10*3/uL (ref 0.0–0.7)
Eosinophils Relative: 1 %
HCT: 29.5 % — ABNORMAL LOW (ref 36.0–46.0)
HEMATOCRIT: 28.7 % — AB (ref 36.0–46.0)
HEMOGLOBIN: 9.6 g/dL — AB (ref 12.0–15.0)
Hemoglobin: 9.2 g/dL — ABNORMAL LOW (ref 12.0–15.0)
LYMPHS ABS: 1.4 10*3/uL (ref 0.7–4.0)
LYMPHS PCT: 10 %
Lymphocytes Relative: 18 %
Lymphs Abs: 1 10*3/uL (ref 0.7–4.0)
MCH: 25.4 pg — AB (ref 26.0–34.0)
MCH: 25.3 pg — AB (ref 26.0–34.0)
MCHC: 32.1 g/dL (ref 30.0–36.0)
MCHC: 32.5 g/dL (ref 30.0–36.0)
MCV: 79.3 fL (ref 78.0–100.0)
MCV: 77.8 fL — ABNORMAL LOW (ref 78.0–100.0)
MONO ABS: 1.3 10*3/uL — AB (ref 0.1–1.0)
MONO ABS: 1.2 10*3/uL — AB (ref 0.1–1.0)
MONOS PCT: 12 %
MONOS PCT: 16 %
NEUTROS ABS: 8 10*3/uL — AB (ref 1.7–7.7)
Neutro Abs: 5.1 10*3/uL (ref 1.7–7.7)
Neutrophils Relative %: 78 %
Neutrophils Relative %: 65 %
PLATELETS: 506 10*3/uL — AB (ref 150–400)
Platelets: 371 10*3/uL (ref 150–400)
RBC: 3.62 MIL/uL — ABNORMAL LOW (ref 3.87–5.11)
RBC: 3.79 MIL/uL — ABNORMAL LOW (ref 3.87–5.11)
RDW: 13.7 % (ref 11.5–15.5)
RDW: 13.5 % (ref 11.5–15.5)
WBC: 10.3 10*3/uL (ref 4.0–10.5)
WBC: 7.9 10*3/uL (ref 4.0–10.5)

## 2015-03-07 LAB — COMPREHENSIVE METABOLIC PANEL
ALBUMIN: 2.4 g/dL — AB (ref 3.5–5.0)
ALT: 11 U/L — ABNORMAL LOW (ref 14–54)
ALT: 10 U/L — ABNORMAL LOW (ref 14–54)
AST: 18 U/L (ref 15–41)
AST: 19 U/L (ref 15–41)
Albumin: 2 g/dL — ABNORMAL LOW (ref 3.5–5.0)
Alkaline Phosphatase: 148 U/L — ABNORMAL HIGH (ref 38–126)
Alkaline Phosphatase: 123 U/L (ref 38–126)
Anion gap: 9 (ref 5–15)
Anion gap: 7 (ref 5–15)
BUN: 9 mg/dL (ref 6–20)
BUN: 6 mg/dL (ref 6–20)
CALCIUM: 8.7 mg/dL — AB (ref 8.9–10.3)
CHLORIDE: 98 mmol/L — AB (ref 101–111)
CHLORIDE: 102 mmol/L (ref 101–111)
CO2: 25 mmol/L (ref 22–32)
CO2: 24 mmol/L (ref 22–32)
Calcium: 8.9 mg/dL (ref 8.9–10.3)
Creatinine, Ser: <0.3 mg/dL — ABNORMAL LOW (ref 0.44–1.00)
Glucose, Bld: 106 mg/dL — ABNORMAL HIGH (ref 65–99)
Glucose, Bld: 105 mg/dL — ABNORMAL HIGH (ref 65–99)
POTASSIUM: 3.5 mmol/L (ref 3.5–5.1)
Potassium: 3.3 mmol/L — ABNORMAL LOW (ref 3.5–5.1)
SODIUM: 132 mmol/L — AB (ref 135–145)
Sodium: 133 mmol/L — ABNORMAL LOW (ref 135–145)
TOTAL PROTEIN: 6.4 g/dL — AB (ref 6.5–8.1)
Total Bilirubin: 0.5 mg/dL (ref 0.3–1.2)
Total Bilirubin: 0.6 mg/dL (ref 0.3–1.2)
Total Protein: 7.5 g/dL (ref 6.5–8.1)

## 2015-03-07 LAB — URINALYSIS, ROUTINE W REFLEX MICROSCOPIC
GLUCOSE, UA: NEGATIVE mg/dL
HGB URINE DIPSTICK: NEGATIVE
KETONES UR: NEGATIVE mg/dL
Leukocytes, UA: NEGATIVE
Nitrite: NEGATIVE
PH: 6 (ref 5.0–8.0)
PROTEIN: 30 mg/dL — AB
Specific Gravity, Urine: 1.027 (ref 1.005–1.030)

## 2015-03-07 LAB — URINE MICROSCOPIC-ADD ON

## 2015-03-07 LAB — C-REACTIVE PROTEIN: CRP: 19.1 mg/dL — AB (ref <1.0)

## 2015-03-07 LAB — PREALBUMIN: PREALBUMIN: 4.3 mg/dL — AB (ref 18–38)

## 2015-03-07 LAB — ETHANOL: Alcohol, Ethyl (B): <5 mg/dL

## 2015-03-07 LAB — SEDIMENTATION RATE: Sed Rate: 119 mm/hr — ABNORMAL HIGH (ref 0–22)

## 2015-03-07 LAB — PHOSPHORUS: Phosphorus: 2.8 mg/dL (ref 2.5–4.6)

## 2015-03-07 MED ORDER — ENSURE ENLIVE PO LIQD
237.0000 mL | Freq: Two times a day (BID) | ORAL | Status: DC
  Administered 2015-03-08 – 2015-03-14 (×10): 237 mL via ORAL

## 2015-03-07 MED ORDER — ALBUTEROL SULFATE HFA 108 (90 BASE) MCG/ACT IN AERS: 1.0000 | INHALATION_SPRAY | Freq: Four times a day (QID) | RESPIRATORY_TRACT | Status: DC | PRN

## 2015-03-07 MED ORDER — PIPERACILLIN-TAZOBACTAM 3.375 G IVPB
3.3750 g | Freq: Three times a day (TID) | INTRAVENOUS | Status: DC
  Administered 2015-03-08 – 2015-03-14 (×20): 3.375 g via INTRAVENOUS
  Filled 2015-03-07 (×21): qty 50

## 2015-03-07 MED ORDER — SODIUM CHLORIDE 0.9 % IV SOLN
INTRAVENOUS | Status: DC
  Administered 2015-03-09 – 2015-03-11 (×2): via INTRAVENOUS

## 2015-03-07 MED ORDER — SODIUM CHLORIDE 0.9 % IV SOLN
INTRAVENOUS | Status: DC
  Administered 2015-03-07: 14:00:00 via INTRAVENOUS

## 2015-03-07 MED ORDER — HYDROCERIN EX CREA
TOPICAL_CREAM | Freq: Two times a day (BID) | CUTANEOUS | Status: DC
  Administered 2015-03-08 – 2015-03-11 (×7): via TOPICAL
  Administered 2015-03-11 – 2015-03-12 (×2): 1 via TOPICAL
  Administered 2015-03-12: 10:00:00 via TOPICAL
  Filled 2015-03-07 (×2): qty 113

## 2015-03-07 MED ORDER — ADULT MULTIVITAMIN W/MINERALS CH
1.0000 | ORAL_TABLET | Freq: Every day | ORAL | Status: DC
  Administered 2015-03-07 – 2015-03-14 (×8): 1 via ORAL
  Filled 2015-03-07 (×8): qty 1

## 2015-03-07 MED ORDER — VANCOMYCIN HCL IN DEXTROSE 1-5 GM/200ML-% IV SOLN
1000.0000 mg | INTRAVENOUS | Status: DC
  Administered 2015-03-08 – 2015-03-11 (×4): 1000 mg via INTRAVENOUS
  Filled 2015-03-07 (×4): qty 200

## 2015-03-07 MED ORDER — PIPERACILLIN-TAZOBACTAM 3.375 G IVPB 30 MIN
3.3750 g | INTRAVENOUS | Status: AC
  Filled 2015-03-07: qty 50

## 2015-03-07 MED ORDER — ACETAMINOPHEN 650 MG RE SUPP: 650.0000 mg | Freq: Four times a day (QID) | RECTAL | Status: DC | PRN

## 2015-03-07 MED ORDER — POTASSIUM CHLORIDE CRYS ER 20 MEQ PO TBCR
40.0000 meq | EXTENDED_RELEASE_TABLET | Freq: Once | ORAL | Status: AC
  Administered 2015-03-07: 40 meq via ORAL
  Filled 2015-03-07: qty 2

## 2015-03-07 MED ORDER — ACETAMINOPHEN 325 MG PO TABS
650.0000 mg | ORAL_TABLET | Freq: Four times a day (QID) | ORAL | Status: DC | PRN
  Administered 2015-03-09 – 2015-03-14 (×5): 650 mg via ORAL
  Filled 2015-03-07 (×5): qty 2

## 2015-03-07 MED ORDER — VANCOMYCIN HCL IN DEXTROSE 1-5 GM/200ML-% IV SOLN: 1000.0000 mg | INTRAVENOUS | Status: AC

## 2015-03-07 MED ORDER — CHLORHEXIDINE GLUCONATE 0.12 % MT SOLN
15.0000 mL | Freq: Two times a day (BID) | OROMUCOSAL | Status: DC
  Administered 2015-03-07 – 2015-03-14 (×14): 15 mL via OROMUCOSAL
  Filled 2015-03-07 (×15): qty 15

## 2015-03-07 MED ORDER — ONDANSETRON HCL 4 MG PO TABS: 4.0000 mg | ORAL_TABLET | Freq: Four times a day (QID) | ORAL | Status: DC | PRN

## 2015-03-07 MED ORDER — ONDANSETRON HCL 4 MG/2ML IJ SOLN: 4.0000 mg | Freq: Four times a day (QID) | INTRAMUSCULAR | Status: DC | PRN

## 2015-03-07 MED ORDER — HYDROMORPHONE HCL 1 MG/ML IJ SOLN
1.0000 mg | INTRAMUSCULAR | Status: DC | PRN
  Administered 2015-03-07 – 2015-03-14 (×13): 1 mg via INTRAVENOUS
  Filled 2015-03-07 (×13): qty 1

## 2015-03-07 MED ORDER — ALBUTEROL SULFATE (2.5 MG/3ML) 0.083% IN NEBU: 2.5000 mg | INHALATION_SOLUTION | Freq: Four times a day (QID) | RESPIRATORY_TRACT | Status: DC | PRN

## 2015-03-07 MED ORDER — SODIUM CHLORIDE 0.9 % IV BOLUS (SEPSIS)
500.0000 mL | Freq: Once | INTRAVENOUS | Status: AC
  Administered 2015-03-07: 500 mL via INTRAVENOUS

## 2015-03-07 NOTE — ED Notes (Signed)
Attempt to start IV and draw labs unsuccessful; IV consult in place.

## 2015-03-07 NOTE — ED Notes (Signed)
Bed: [id number] Expected date:  Expected time:  Means of arrival:  Comments: EMS/55F/L buttock pressure ulcer

## 2015-03-07 NOTE — ED Notes (Signed)
ULTRA SOUND IV REQUIRED FOR THIS PATIENT
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
## 2015-03-07 NOTE — Progress Notes (Signed)
EDCM spoke to patient and her daughter April Graves at bedside.  Patient reports her daughter lives with her.  Patient reports her son April Graves. [phone] takes care of her medical needs.  Patient reports she has a wheelchair at home.  Patient reports she does not ambulate.  Patient reports she is interested in a hospital bed for home.  [facility] informed patient that a hospital bed can be ordered for her.  Patient reports she would like to order her bed from an orthopedic supply store on [address].  Patient pronounce it as "[facility]"  But she says it is not Erlene Quan.  EDCM unable to find this store.  Patient reports her pcp is at [place] off of [address].  System updated.  EDCM provided patient with list of home health agencies in [place] explained services.  Patient's daughter reports she would like "someone to come out and stay with her and help her get washed and dressed."  [facility] provided patient with list of private duty nursing agencies and explained services.  Patient reports she has Medicaid, but it is not listed in EPIC.  [facility] informed patient that private duty nursing services would be an out of pocket expense.  If patient has Medicaid, to contact DSS for CAP services, but patient may be too young for services.  Patient and patient's daughter thankful for services.  No further EDCM needs at this time.

## 2015-03-07 NOTE — Consult Note (Signed)
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  Dry, edematous peeling feet, at high risjk for pressure injury Wound type: Pressure, venous insufficiency Pressure Ulcer POA: Yes Measurement:left hip:  4cm x 6cm x 4.5cm with significant undermining and tissue destruction from 10-3 o'clock.  The greatest depth is at 1 o'clock and measures 6cm.  Red wound bed, but it is not possible to view all of wound as the aperture is smaller at the top than beneath the skin.  Thick, red exudate in a moderate amount is noted.  The stage 3 pressure injury to the left buttock measures 2.5cm x 1cm x 0.2cm with sloughing epidermis and some subcutaneous yellow tissue evident.  Scant exudate.Bilateral LEs are edematous, skin on feet is peeling, but there is no ulceration. Wound bed:As described above. Drainage (amount, consistency, odor) As described above Periwound:intact, dry Dressing procedure/placement/frequency:CCS has been consulted for the left hip Stage 4 pressure injury, but I will implement a twice daily and PRN dressing until their assessment.  The left buttock will be dressed with a silicone foam dressing, the bilateral feet will be washed twice daily ad Eucerin Cream applied.  Pressure redistribution will be provided via [employer]. A support surface with low air loss feature is provided. Patient will be turned side to side except for meals. [place] nursing team will not follow, but will remain available to this patient, the nursing and medical teams.  Please re-consult if needed. Thanks, Maudie Flakes, MSN, RN, [place], Ar[facility]  Pager# [phone]

## 2015-03-07 NOTE — ED Notes (Addendum)
Per [facility], pt from home, reports L buttock pressure ulcer and bila leg swelling.  Pt is wheelchair bound.  A&Ox 4.  Weakness.  PTAR reports "reddish" drainage from the ulcer.
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
## 2015-03-07 NOTE — ED Notes (Signed)
Unable to collect labs patient is not in room at this time

## 2015-03-07 NOTE — ED Notes (Signed)
Patient transported to X-ray 

## 2015-03-07 NOTE — ED Notes (Signed)
Pt complaint of generalized teeth pain related to teeth falling out and left buttocks pain related to sore.

## 2015-03-08 ENCOUNTER — Inpatient Hospital Stay (HOSPITAL_COMMUNITY): Payer: Commercial Managed Care - HMO

## 2015-03-08 DIAGNOSIS — L89204 Pressure ulcer of unspecified hip, stage 4: Secondary | ICD-10-CM | POA: Diagnosis present

## 2015-03-08 DIAGNOSIS — E11621 Type 2 diabetes mellitus with foot ulcer: Secondary | ICD-10-CM

## 2015-03-08 LAB — CBC
HEMATOCRIT: 29.6 % — AB (ref 36.0–46.0)
HEMOGLOBIN: 9.4 g/dL — AB (ref 12.0–15.0)
MCH: 25.3 pg — ABNORMAL LOW (ref 26.0–34.0)
MCHC: 31.8 g/dL (ref 30.0–36.0)
MCV: 79.6 fL (ref 78.0–100.0)
Platelets: 455 10*3/uL — ABNORMAL HIGH (ref 150–400)
RBC: 3.72 MIL/uL — ABNORMAL LOW (ref 3.87–5.11)
RDW: 13.8 % (ref 11.5–15.5)
WBC: 6.6 10*3/uL (ref 4.0–10.5)

## 2015-03-08 LAB — COMPREHENSIVE METABOLIC PANEL
ALK PHOS: 120 U/L (ref 38–126)
ALT: 10 U/L — ABNORMAL LOW (ref 14–54)
ANION GAP: 5 (ref 5–15)
AST: 15 U/L (ref 15–41)
Albumin: 2 g/dL — ABNORMAL LOW (ref 3.5–5.0)
BUN: 5 mg/dL — ABNORMAL LOW (ref 6–20)
CALCIUM: 8.8 mg/dL — AB (ref 8.9–10.3)
CHLORIDE: 105 mmol/L (ref 101–111)
CO2: 24 mmol/L (ref 22–32)
Creatinine, Ser: <0.3 mg/dL — ABNORMAL LOW (ref 0.44–1.00)
Glucose, Bld: 93 mg/dL (ref 65–99)
Potassium: 4.1 mmol/L (ref 3.5–5.1)
SODIUM: 134 mmol/L — AB (ref 135–145)
Total Bilirubin: 0.5 mg/dL (ref 0.3–1.2)
Total Protein: 6.4 g/dL — ABNORMAL LOW (ref 6.5–8.1)

## 2015-03-08 LAB — HIV ANTIBODY (ROUTINE TESTING W REFLEX): HIV SCREEN 4TH GENERATION: NONREACTIVE

## 2015-03-08 LAB — URINE CULTURE
Culture: NO GROWTH
SPECIAL REQUESTS: NORMAL

## 2015-03-08 LAB — GLUCOSE, CAPILLARY: Glucose-Capillary: 116 mg/dL — ABNORMAL HIGH (ref 65–99)

## 2015-03-08 MED ORDER — CYANOCOBALAMIN 1000 MCG/ML IJ SOLN
1000.0000 ug | Freq: Once | INTRAMUSCULAR | Status: AC
Start: 2015-03-08 — End: 2015-03-08
  Administered 2015-03-08: 1000 ug via INTRAMUSCULAR
  Filled 2015-03-08: qty 1

## 2015-03-08 MED ORDER — PRO-STAT SUGAR FREE PO LIQD
30.0000 mL | Freq: Two times a day (BID) | ORAL | Status: DC
  Administered 2015-03-08 – 2015-03-14 (×13): 30 mL via ORAL
  Filled 2015-03-08 (×14): qty 30

## 2015-03-08 NOTE — Progress Notes (Signed)
Physical Therapy Wound Evaluation Patient Details  Name: April Graves MRN: [id number] Date of Birth: 16-Aug-1955  Today's Date: 03/08/2015 Time: 1405-1440 Time Calculation (min): 35 min  Subjective  Subjective: Reports wounds being cared for by family at home Patient and Family Stated Goals: To heal wounds Date of Onset: 02/22/15 Prior Treatments: Dressing changes at home; surgical consult  Pain Score: Pain Score: 6   Wound Assessment                                  Wound / Incision (Open or Dehisced) 03/08/15 Other (Comment) Hip Left (Active)  Dressing Type ABD;Moist to dry;Gauze (Comment) 03/08/2015  3:00 PM  Dressing Changed Changed 03/08/2015  3:00 PM  Dressing Status Clean;Dry;Intact 03/08/2015  3:00 PM  Dressing Change Frequency Daily 03/08/2015  3:00 PM  Site / Wound Assessment Granulation tissue;Clean;Yellow 03/08/2015  3:00 PM  % Wound base Red or Granulating 80% 03/08/2015  3:00 PM  % Wound base Yellow 20% 03/08/2015  3:00 PM  Peri-wound Assessment Denuded 03/08/2015  3:00 PM  Wound Length (cm) 6 cm 03/08/2015  3:00 PM  Wound Width (cm) 4.5 cm 03/08/2015  3:00 PM  Wound Depth (cm) 4.5 cm 03/08/2015  3:00 PM  Undermining (cm) 6 03/08/2015  3:00 PM  Margins Unattached edges (unapproximated) 03/08/2015  3:00 PM  Closure None 03/08/2015  3:00 PM  Drainage Amount Minimal 03/08/2015  3:00 PM  Drainage Description Serosanguineous;No odor 03/08/2015  3:00 PM  Non-staged Wound Description Full thickness 03/08/2015  3:00 PM  Treatment Cleansed;Debridement (Selective);Hydrotherapy (Pulse lavage);Packing (Saline gauze);Tape changed 03/08/2015  3:00 PM   Hydrotherapy Pulsed lavage therapy - wound location: L hip over greater troch Pulsed Lavage with Suction (psi): 8 psi Pulsed Lavage with Suction - Normal Saline Used: 1000 mL Pulsed Lavage Tip: Tip with splash shield Selective Debridement Selective Debridement - Location: wound base Selective Debridement - Tools Used:  Forceps;Scissors Selective Debridement - Tissue Removed: grey slough   Wound Assessment and Plan  Wound Therapy - Assess/Plan/Recommendations Wound Therapy - Clinical Statement: Patient presents with pressure ulcer with decreased mobility, poor nutritional status and limited ability to reposition and will benefit from skilled PT for hydrotherapy and selective debridement to assist in wound healing, positioning and education for prevention. Wound Therapy - Functional Problem List: immobility, decreased nutritional status Factors Delaying/Impairing Wound Healing: Immobility;Multiple medical problems Hydrotherapy Plan: Debridement;Dressing change;Patient/family education;Pulsatile lavage with suction Wound Therapy - Frequency: 6X / week Wound Therapy - Current Recommendations: Case manager/social work;Nutritionist;PT Wound Therapy - Follow Up Recommendations: Skilled nursing facility  Wound Therapy Goals- Improve the function of patient's integumentary system by progressing the wound(s) through the phases of wound healing (inflammation - proliferation - remodeling) by: Decrease Necrotic Tissue to: 10% Decrease Necrotic Tissue - Progress: Goal set today Increase Granulation Tissue to: 90% Increase Granulation Tissue - Progress: Goal set today Decrease Length/Width/Depth by (cm): depth by .5 cm Decrease Length/Width/Depth - Progress: Goal set today Patient/Family will be able to : verbalize proper positioning for prevention of skin breakdown Patient/Family Instruction Goal - Progress: Goal set today Goals/treatment plan/discharge plan were made with and agreed upon by patient/family: Yes Time For Goal Achievement: 7 days Wound Therapy - Potential for Goals: Good  Goals will be updated until maximal potential achieved or discharge criteria met.  Discharge criteria: when goals achieved, discharge from hospital, MD decision/surgical intervention, no progress towards goals, refusal/missing three  consecutive treatments without notification  or medical reason.  GP     Reginia Naas 03/08/2015, 3:35 PM  Magda Kiel, [place] 03/08/2015
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
## 2015-03-08 NOTE — Progress Notes (Signed)
Patient refused MRI at this time, states I had one at [facility] about 6 months ago, and they are not going to do surgery.
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
## 2015-03-08 NOTE — Progress Notes (Addendum)
Patient ID: April Graves, female   DOB: Oct 18, 1955, 60 y.o.   MRN: [id number] [facility] PROGRESS NOTE  April Graves [id number] DOB: 1955-02-16 DOA: 03/07/2015 PCP: Jearld Shines with [facility]   Brief narrative:    60 y.o. female, no significant medical issues, last time seen in ED in 07/2014 for hip and ankle pain who now presented to [facility] ED because of worsening left thigh ulceration with foul smelling discharge, pus drainage. Patient and her daughter not sure how long this has been there and caregiver has not really told them about the wound.  In ED, BP was 107/53, HR 108, T max 98.3 F, RR 16. Blood work showed hemoglobin 9.2, potassium 3.3 which was supplemented. WOC and surgery were consulted by ED physician. Pt started on vanco and zosyn.  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 Dry, edematous peeling  - Measurement:left hip: 4cm x 6cm x 4.5cm with significant undermining and tissue destruction from 10-3 o'clock. The greatest depth is at 1 o'clock and measures 6cm.The stage 3 pressure injury to the left buttock measures 2.5cm x 1cm x 0.2cm with sloughing epidermis and some subcutaneous yellow tissue evident with scant exudate. Bilateral LEs are edematous, skin on feet is peeling but there is no ulceration. - Dressing procedure/placement/frequency:Stage 4 pressure injury - twice daily and PRN dressing. The left buttock - silicone foam dressing. The bilateral feet - wash twice daily and ad Eucerin Cream. - Pressure redistribution provided via Prevalon Boots.   Functional quadriplegia - Obtain PT eval    Anemia of chronic disease - Hemoglobin stable at 9.4   Hypokalemia - Likely related to poor nutritional status - Supplemented    DVT Prophylaxis  - SCD's bilaterally, prevalon boots    Code Status: Full.  Family Communication:  plan of care discussed with the patient Disposition Plan: Unclear when she will be ready for discharge at this point.   IV access:  Peripheral IV  Procedures and diagnostic studies:    Dg Chest 2 View 03/07/2015  No acute cardiopulmonary disease. Electronically Signed   By: Evangeline Dakin M.D.   On: 03/07/2015 12:16   Medical Consultants:  Surgery  Other Consultants:  PT  IAnti-Infectives:   Vanco and zosyn 03/07/2015 -->   Leisa Lenz, MD  [facility] Pager [phone]  Time spent in minutes: 25 minutes  If 7PM-7AM, please contact night-coverage [email or web address] Password [facility] 03/08/2015, 11:43 AM   LOS: 1 day    HPI/Subjective: No acute overnight events. Patient reports feeling tired.   Objective: Filed Vitals:   03/07/15 1717 03/07/15 1818 03/07/15 2138 03/08/15 0555  BP: 135/91 107/53 108/66 123/73  Pulse: 118 115 98 101  Temp: 98.3 F (36.8 C) 98.3 F (36.8 C) 98.3 F (36.8 C) 98.2 F (36.8 C)  TempSrc: Oral Oral Oral Oral  Resp: 18 18 18 18   Height: 5\' 4"  (1.626 m)     Weight: 127 lb (57.607 kg)     SpO2: 100% 100% 100% 100%    Intake/Output Summary (Last 24 hours) at 03/08/15 1143 Last data filed at 03/08/15 0949  Gross per 24 hour  Intake    120 ml  Output    200 ml  Net    -80 ml  Exam:   General:  Pt is alert, not in acute distress  Cardiovascular: Regular rate and rhythm, S1/S2 (+)  Respiratory: Clear to auscultation bilaterally, no wheezing, no crackles, no rhonchi  Abdomen: Soft, non tender, non distended, bowel sounds present  Neuro: Grossly nonfocal  Skin:    Data Reviewed: Basic Metabolic Panel:  Recent Labs Lab 03/07/15 1326 03/07/15 2310 03/08/15 0605  NA 132* 133* 134*  K 3.3* 3.5 4.1  CL 98* 102 105  CO2 25 24 24   GLUCOSE 106* 105* 93  BUN 9 6 5*  CREATININE <0.30* <0.30* <0.30*  CALCIUM 8.9 8.7*  8.8*  MG  --  1.9  --   PHOS  --  2.8  --    Liver Function Tests:  Recent Labs Lab 03/07/15 1326 03/07/15 2310 03/08/15 0605  AST 18 19 15   ALT 11* 10* 10*  ALKPHOS 148* 123 120  BILITOT 0.5 0.6 0.5  PROT 7.5 6.4* 6.4*  ALBUMIN 2.4* 2.0* 2.0*   No results for input(s): LIPASE, AMYLASE in the last 168 hours. No results for input(s): AMMONIA in the last 168 hours. CBC:  Recent Labs Lab 03/07/15 1326 03/07/15 2310 03/08/15 0605  WBC 10.3 7.9 6.6  NEUTROABS 8.0* 5.1  --   HGB 9.2* 9.6* 9.4*  HCT 28.7* 29.5* 29.6*  MCV 79.3 77.8* 79.6  PLT 506* 371 455*   Cardiac Enzymes: No results for input(s): CKTOTAL, CKMB, CKMBINDEX, TROPONINI in the last 168 hours. BNP: Invalid input(s): POCBNP CBG:  Recent Labs Lab 03/08/15 0813  GLUCAP 116*    Recent Results (from the past 240 hour(s))  Urine culture     Status: None   Collection Time: 03/07/15 12:26 PM  Result Value Ref Range Status   Specimen Description URINE, CATHETERIZED  Final   Special Requests Normal  Final   Culture   Final    NO GROWTH 1 DAY Performed at [facility]    Report Status 03/08/2015 FINAL  Final     Scheduled Meds: . chlorhexidine  15 mL Mouth/Throat BID  . feeding supplement (ENSURE ENLIVE)  237 mL Oral BID BM  . hydrocerin   Topical BID  . multivitamin with minerals  1 tablet Oral Daily  . piperacillin-tazobactam  3.375 g Intravenous NOW  . piperacillin-tazobactam (ZOSYN)  IV  3.375 g Intravenous Q8H  . vancomycin  1,000 mg Intravenous NOW  . vancomycin  1,000 mg Intravenous Q24H   Continuous Infusions: . sodium chloride

## 2015-03-08 NOTE — Progress Notes (Signed)
Refused prevalon boots/scds

## 2015-03-08 NOTE — Progress Notes (Signed)
VASCULAR LAB PRELIMINARY  ARTERIAL  ABI completed:  Bilateral ABIs and pedal waveforms within normal limits.    RIGHT    LEFT    PRESSURE WAVEFORM  PRESSURE WAVEFORM  BRACHIAL 114 Triphasic  BRACHIAL 120 Triphasic   DP 136 Triphasic  DP 128 Triphasic   AT   AT    PT 131 Triphasic  PT  Unable to sample due to patient position  PER   PER 134 Triphasic   GREAT TOE  NA GREAT TOE  NA    RIGHT LEFT  ABI 1.13 1.12     April Graves, RVT 03/08/2015, 11:36 AM

## 2015-03-08 NOTE — Progress Notes (Signed)
Initial Nutrition Assessment  DOCUMENTATION CODES:   Not applicable  INTERVENTION -Multivitamin Tablet -Ensure Enlive po BID, each supplement provides 350 kcal and 20 grams of protein -Pro-Stat 38mL BID  NUTRITION DIAGNOSIS:   Increased nutrient needs related to wound healing as evidenced by estimated needs.  GOAL:   Patient will meet greater than or equal to 90% of their needs  MONITOR:   PO intake, Supplement acceptance, Skin, I & O's, Labs, Weight trends  REASON FOR ASSESSMENT:   Consult Wound healing  ASSESSMENT:   60 y.o. female, no significant medical issues, last time seen in ED in 07/2014 for hip and ankle pain who now presented to [facility] ED because of worsening left thigh ulceration with foul smelling discharge, pus drainage. Patient and her daughter not sure how long this has been there and caregiver has not really told them about the wound.  Pt was receiving wound care during time of visit. She endorses weight loss but unable to recount an amount. Per chart, pt has lost 8#/5% in 7 months, an insignificant amount. Pt endorses a good appetite at home. Unable to recount time she has had multiple wounds, caregiver did not make her aware of them. Will require outpt woundcare to prevent  recurrence.  Nutrition-Focused physical exam completed. Findings are mild fat depletion, no muscle depletion, and moderate edema.   Labs & Medications reviewed.   Diet Order:  Diet regular Room service appropriate?: Yes; Fluid consistency:: Thin  Skin:  Wound (see comment) (Stg IV to L Hip, Stg III to Buttocks.)  Last BM:  PTA  Height:   Ht Readings from Last 1 Encounters:  03/07/15 5\' 4"  (1.626 m)    Weight:   Wt Readings from Last 1 Encounters:  03/07/15 127 lb (57.607 kg)    Ideal Body Weight:  54.54 kg  BMI:  Body mass index is 21.79 kg/(m^2).  Estimated Nutritional Needs:   Kcal:  1450-1750  Protein:  60-75 grams  Fluid:  >/= 1.5L  EDUCATION NEEDS:   No  education needs identified at this time  Satira Anis. Nils Thor, MS, RD LDN After Hours/Weekend Pager [phone]

## 2015-03-09 LAB — HEMOGLOBIN A1C
HEMOGLOBIN A1C: 5.8 % — AB (ref 4.8–5.6)
MEAN PLASMA GLUCOSE: 120 mg/dL

## 2015-03-09 LAB — GLUCOSE, CAPILLARY: Glucose-Capillary: 102 mg/dL — ABNORMAL HIGH (ref 65–99)
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
## 2015-03-09 NOTE — Progress Notes (Addendum)
Patient ID: April Graves, female   DOB: 13-Sep-1955, 60 y.o.   MRN: [id number] [facility] PROGRESS NOTE  April Graves [id number] DOB: 05-15-1955 DOA: 03/07/2015 PCP: Jearld Shines with [place]   Brief narrative:    60 y.o. female, no significant medical issues, last time seen in ED in 07/2014 for hip and ankle pain who now presented to [facility] ED because of worsening left thigh ulceration with foul smelling discharge, pus drainage. Patient and her daughter not sure how long this has been there and caregiver has not really told them about the wound.  In ED, BP was 107/53, HR 108, T max 98.3 F, RR 16. Blood work showed hemoglobin 9.2, potassium 3.3 which was supplemented. WOC and surgery saw the pt in consultation. Pt started on vanco and zosyn.  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 Dry, edematous peeling  - Measurement:left hip: 4cm x 6cm x 4.5cm with significant undermining and tissue destruction from 10-3 o'clock. The greatest depth is at 1 o'clock and measures 6cm.The stage 3 pressure injury to the left buttock measures 2.5cm x 1cm x 0.2cm with sloughing epidermis and some subcutaneous yellow tissue evident with scant exudate. Bilateral LEs are edematous, skin on feet is peeling but there is no ulceration. - Dressing procedure/placement/frequency:Stage 4 pressure injury - twice daily and PRN dressing. The left buttock - silicone foam dressing. The bilateral feet - wash twice daily and ad Eucerin Cream. - Pressure redistribution provided via Prevalon Boots. - She s receiving hydrotherapy - She is on vanco and zosyn - She has declined MRI pelvis - She has declined SNF placement   Functional quadriplegia - PT eval pending    Anemia of chronic disease - Hemoglobin stable    Hypokalemia - Likely related to poor nutritional status - Supplemented   DVT  Prophylaxis  - SCD's bilaterally, prevalon boots    Code Status: Full.  Family Communication:  plan of care discussed with the patient Disposition Plan: Unclear when she will be ready for discharge at this point.   IV access:  Peripheral IV  Procedures and diagnostic studies:    Dg Chest 2 View 03/07/2015  No acute cardiopulmonary disease. Electronically Signed   By: Evangeline Dakin M.D.   On: 03/07/2015 12:16   Medical Consultants:  Surgery  Other Consultants:  PT WOC  IAnti-Infectives:   Vanco and zosyn 03/07/2015 -->   Leisa Lenz, MD  [facility] Pager [phone]  Time spent in minutes: 25 minutes  If 7PM-7AM, please contact night-coverage [email or web address] Password [email or web address] 03/09/2015, 12:05 PM   LOS: 2 days    HPI/Subjective: No acute overnight events. Pain is controlled.   Objective: Filed Vitals:   03/08/15 0555 03/08/15 1518 03/08/15 2225 03/09/15 0539  BP: 123/73 119/77 119/77 133/86  Pulse: 101 112 114 105  Temp: 98.2 F (36.8 C) 98 F (36.7 C) 98.9 F (37.2 C) 98.5 F (36.9 C)  TempSrc: Oral Oral Oral Oral  Resp: 18 18 18 18   Height:      Weight:    107 lb 5.8 oz (48.7 kg)  SpO2: 100% 99% 99% 99%    Intake/Output Summary (Last 24 hours) at 03/09/15 1205 Last data filed at 03/09/15 0500  Gross per 24 hour  Intake   1710 ml  Output      0 ml  Net   1710 ml  Exam:   General:  Pt is not in acute distress  Cardiovascular: RRR, appreciate S1, S2  Respiratory: No wheezing, no crackles, no rhonchi  Abdomen: non tender, (+) BS  Neuro: Nonfocal  Skin:    Data Reviewed: Basic Metabolic Panel:  Recent Labs Lab 03/07/15 1326 03/07/15 2310 03/08/15 0605  NA 132* 133* 134*  K 3.3* 3.5 4.1  CL 98* 102 105  CO2 25 24 24   GLUCOSE 106* 105* 93  BUN 9 6 5*  CREATININE <0.30* <0.30* <0.30*  CALCIUM 8.9 8.7* 8.8*  MG  --  1.9  --   PHOS  --  2.8  --    Liver Function Tests:  Recent Labs Lab 03/07/15 1326 03/07/15 2310  03/08/15 0605  AST 18 19 15   ALT 11* 10* 10*  ALKPHOS 148* 123 120  BILITOT 0.5 0.6 0.5  PROT 7.5 6.4* 6.4*  ALBUMIN 2.4* 2.0* 2.0*   No results for input(s): LIPASE, AMYLASE in the last 168 hours. No results for input(s): AMMONIA in the last 168 hours. CBC:  Recent Labs Lab 03/07/15 1326 03/07/15 2310 03/08/15 0605  WBC 10.3 7.9 6.6  NEUTROABS 8.0* 5.1  --   HGB 9.2* 9.6* 9.4*  HCT 28.7* 29.5* 29.6*  MCV 79.3 77.8* 79.6  PLT 506* 371 455*   Cardiac Enzymes: No results for input(s): CKTOTAL, CKMB, CKMBINDEX, TROPONINI in the last 168 hours. BNP: Invalid input(s): POCBNP CBG:  Recent Labs Lab 03/08/15 0813 03/09/15 0731  GLUCAP 116* 102*    Recent Results (from the past 240 hour(s))  Urine culture     Status: None   Collection Time: 03/07/15 12:26 PM  Result Value Ref Range Status   Specimen Description URINE, CATHETERIZED  Final   Special Requests Normal  Final   Culture   Final    NO GROWTH 1 DAY Performed at [facility]    Report Status 03/08/2015 FINAL  Final     Scheduled Meds: . chlorhexidine  15 mL Mouth/Throat BID  . feeding supplement (ENSURE ENLIVE)  237 mL Oral BID BM  . feeding supplement (PRO-STAT SUGAR FREE 64)  30 mL Oral BID  . hydrocerin   Topical BID  . multivitamin with minerals  1 tablet Oral Daily  . piperacillin-tazobactam (ZOSYN)  IV  3.375 g Intravenous Q8H  . vancomycin  1,000 mg Intravenous Q24H   Continuous Infusions: . sodium chloride 75 mL/hr at 03/09/15 0335

## 2015-03-09 NOTE — Progress Notes (Signed)
Patient refuses to wear Prevalon Boot.

## 2015-03-09 NOTE — Progress Notes (Signed)
PT/Hydrotherapy Cancellation Note  Patient Details Name: April Graves MRN: [id number] DOB: Dec 10, 1955   Cancelled Treatment:    Reason Eval/Treat Not Completed: Other (comment); pt declines hydrotherapy because she has "company coming" ; will attempt again tomorrow;   [facility] 03/09/2015, 4:44 PM

## 2015-03-09 NOTE — Progress Notes (Signed)
Spoke with patient at bedside and patient called son who was on speaker phone. Patient requesting hospital bed, patient had spoken with someone from a company on [place] but could not remember name of company. After discussion with patient and son, son states he would be fine with getting a bed from [facility]. Contacted [place] to notify her, awaiting final order. Patient also states she does not have a caregiver at home, her daughter is the only one who cares for her, she thinks she has Medicaid but there is not indication of that, her son says her only coverage is through [place].

## 2015-03-10 LAB — GLUCOSE, CAPILLARY: Glucose-Capillary: 90 mg/dL (ref 65–99)

## 2015-03-10 NOTE — Care Management Important Message (Signed)
Important Message  Patient Details  Name: April Graves MRN: [id number] Date of Birth: Feb 02, 1956   Medicare Important Message Given:  Yes    Camillo Flaming 03/10/2015, 9:25 AMImportant Message  Patient Details  Name: April Graves MRN: [id number] Date of Birth: 04-16-1955   Medicare Important Message Given:  Yes    Camillo Flaming 03/10/2015, 9:25 AM

## 2015-03-10 NOTE — Progress Notes (Signed)
Physical Therapy Wound Treatment Patient Details  Name: April Graves MRN: [id number] Date of Birth: 1955-05-01  Today's Date: 03/10/2015 Time:  [id number]    Subjective  Subjective: i want this to heal right Patient and Family Stated Goals: To heal wounds Date of Onset: 02/22/15  Prior Treatments: Dressing changes at home; surgical consult  Pain Score: Pain Score: 0-No pain  Wound Assessment  Dressing Type Moist to dry;Gauze;Hypafix  Dressing Changed Changed  Dressing Status Old drainage  Dressing Change Frequency Daily  Site / Wound Assessment Granulation tissue;Clean;Yellow  % Wound base Red or Granulating 80%  % Wound base Yellow 20%  Margins Unattached edges (unapproximated)  Closure None  Drainage Amount Moderate  Drainage Description Serosanguineous;No odor  Non-staged Wound Description Full thickness  Treatment Hydrotherapy (Pulse lavage);Packing (Saline gauze);Tape changed   Hydrotherapy Pulsed lavage therapy - wound location: L hip over greater troch Pulsed Lavage with Suction (psi): 8 psi Pulsed Lavage with Suction - Normal Saline Used: 1000 mL Pulsed Lavage Tip: Tip with splash shield   Wound Assessment and Plan  Wound Therapy - Assess/Plan/Recommendations Wound Therapy - Clinical Statement: Patient presents with pressure ulcer with decreased mobility, poor nutritional status and limited ability to reposition and will benefit from skilled PT for hydrotherapy and selective debridement to assist in wound healing, positioning and education for prevention. Wound Therapy - Functional Problem List: immobility, decreased nutritional status Factors Delaying/Impairing Wound Healing: Immobility;Multiple medical problems Hydrotherapy Plan: Debridement;Dressing change;Patient/family education;Pulsatile lavage with suction Wound Therapy - Frequency: 6X / week Wound Therapy - Current Recommendations: Case manager/social work;Nutritionist;PT Wound Therapy - Follow Up  Recommendations: Skilled nursing facility  Wound Therapy Goals- Improve the function of patient's integumentary system by progressing the wound(s) through the phases of wound healing (inflammation - proliferation - remodeling) by: Decrease Necrotic Tissue - Progress: Progressing toward goal Increase Granulation Tissue - Progress: Progressing toward goal Decrease Length/Width/Depth - Progress: Progressing toward goal Patient/Family Instruction Goal - Progress: Progressing toward goal Goals/treatment plan/discharge plan were made with and agreed upon by patient/family: Yes Time For Goal Achievement: 7 days Wound Therapy - Potential for Goals: Good  Goals will be updated until maximal potential achieved or discharge criteria met.  Discharge criteria: when goals achieved, discharge from hospital, MD decision/surgical intervention, no progress towards goals, refusal/missing three consecutive treatments without notification or medical reason.  GP      Weston Anna, MPT Pager: [phone]

## 2015-03-10 NOTE — Progress Notes (Signed)
Patient ID: April Graves, female   DOB: 11-28-1955, 60 y.o.   MRN: [id number] [facility] PROGRESS NOTE  PARUL STEINMAN [id number] DOB: 1956-01-01 DOA: 03/07/2015 PCP: Jearld Shines with [place]   Brief narrative:    60 y.o. female, no significant medical issues, last time seen in ED in 07/2014 for hip and ankle pain who now presented to [facility] ED because of worsening left thigh ulceration with foul smelling discharge, pus drainage. Patient and her daughter not sure how long this has been there and caregiver has not really told them about the wound.  In ED, BP was 107/53, HR 108, T max 98.3 F, RR 16. Blood work showed hemoglobin 9.2, potassium 3.3 which was supplemented. WOC and surgery saw the pt in consultation. Pt started on vanco and zosyn.  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 Dry, edematous peeling  - Measurement:left hip: 4cm x 6cm x 4.5cm with significant undermining and tissue destruction from 10-3 o'clock. The greatest depth is at 1 o'clock and measures 6cm.The stage 3 pressure injury to the left buttock measures 2.5cm x 1cm x 0.2cm with sloughing epidermis and some subcutaneous yellow tissue evident with scant exudate. Bilateral LEs are edematous, skin on feet is peeling but there is no ulceration. - Dressing procedure/placement/frequency:Stage 4 pressure injury - twice daily and PRN dressing. The left buttock - silicone foam dressing. The bilateral feet - wash twice daily and ad Eucerin Cream. - Pressure redistribution provided via Prevalon Boots. - She s receiving hydrotherapy - She is on vanco and zosyn for now. She has declined MRI of the pelvis for further evaluation. - Surgery has signed off.   Functional quadriplegia - We'll see if patient can go to skilled nursing facility. - Appreciate Education officer, museum assisting discharge planning   Anemia  of chronic disease - Hemoglobin stable at 9.4 - No reports of bleeding - No current indications for transfusion   Hypokalemia - Likely related to poor nutritional status - Supplemented and is now within normal limits  DVT Prophylaxis  - SCD's bilaterally, prevalon boots    Code Status: Full.  Family Communication:  plan of care discussed with the patient Disposition Plan: Probably to skilled nursing facility by Monday, 03/14/2015  IV access:  Peripheral IV  Procedures and diagnostic studies:    Dg Chest 2 View 03/07/2015  No acute cardiopulmonary disease. Electronically Signed   By: Evangeline Dakin M.D.   On: 03/07/2015 12:16   Medical Consultants:  Surgery  Other Consultants:  PT WOC  IAnti-Infectives:   Vanco and zosyn 03/07/2015 -->   Leisa Lenz, MD  [facility] Pager [phone]  Time spent in minutes: 25 minutes  If 7PM-7AM, please contact night-coverage [email or web address] Password [facility] 03/10/2015, 11:48 AM   LOS: 3 days    HPI/Subjective: No acute overnight events. In better spirits today, now prefers to go to skilled nursing facility to get wound therapy.   Objective: Filed Vitals:   03/09/15 1000 03/09/15 1400 03/09/15 2054 03/10/15 0602  BP: 134/89 119/82 131/85 118/77  Pulse: 103 113 112 103  Temp: 98.3 F (36.8 C) 98.3 F (36.8 C) 98.3 F (36.8 C) 98.2 F (36.8 C)  TempSrc: Oral Oral Oral   Resp: 24 20 20 20   Height:      Weight:      SpO2: 100% 100% 98% 99%    Intake/Output Summary (  Last 24 hours) at 03/10/15 1148 Last data filed at 03/10/15 0600  Gross per 24 hour  Intake   2300 ml  Output      0 ml  Net   2300 ml    Exam:   General:  Pt is alert, awake  Cardiovascular: Slight tachycardia, appreciate S1-S2  Respiratory: Bilateral air entry, no wheezing  Abdomen: non tender, (+) BS, no rebound tenderness or guarding  Neuro: No focal deficits   Skin:    Data Reviewed: Basic Metabolic Panel:  Recent Labs Lab  03/07/15 1326 03/07/15 2310 03/08/15 0605  NA 132* 133* 134*  K 3.3* 3.5 4.1  CL 98* 102 105  CO2 25 24 24   GLUCOSE 106* 105* 93  BUN 9 6 5*  CREATININE <0.30* <0.30* <0.30*  CALCIUM 8.9 8.7* 8.8*  MG  --  1.9  --   PHOS  --  2.8  --    Liver Function Tests:  Recent Labs Lab 03/07/15 1326 03/07/15 2310 03/08/15 0605  AST 18 19 15   ALT 11* 10* 10*  ALKPHOS 148* 123 120  BILITOT 0.5 0.6 0.5  PROT 7.5 6.4* 6.4*  ALBUMIN 2.4* 2.0* 2.0*   No results for input(s): LIPASE, AMYLASE in the last 168 hours. No results for input(s): AMMONIA in the last 168 hours. CBC:  Recent Labs Lab 03/07/15 1326 03/07/15 2310 03/08/15 0605  WBC 10.3 7.9 6.6  NEUTROABS 8.0* 5.1  --   HGB 9.2* 9.6* 9.4*  HCT 28.7* 29.5* 29.6*  MCV 79.3 77.8* 79.6  PLT 506* 371 455*   Cardiac Enzymes: No results for input(s): CKTOTAL, CKMB, CKMBINDEX, TROPONINI in the last 168 hours. BNP: Invalid input(s): POCBNP CBG:  Recent Labs Lab 03/08/15 0813 03/09/15 0731 03/10/15 0738  GLUCAP 116* 102* 90    Recent Results (from the past 240 hour(s))  Urine culture     Status: None   Collection Time: 03/07/15 12:26 PM  Result Value Ref Range Status   Specimen Description URINE, CATHETERIZED  Final   Special Requests Normal  Final   Culture   Final    NO GROWTH 1 DAY Performed at [facility]    Report Status 03/08/2015 FINAL  Final  Blood Cultures x 2 sites     Status: None (Preliminary result)   Collection Time: 03/07/15  1:26 PM  Result Value Ref Range Status   Specimen Description BLOOD RIGHT ANTECUBITAL  Final   Special Requests IN PEDIATRIC BOTTLE 4 CC EA  Final   Culture   Final    NO GROWTH 2 DAYS Performed at [facility]    Report Status PENDING  Incomplete  Blood Cultures x 2 sites     Status: None (Preliminary result)   Collection Time: 03/07/15  5:30 PM  Result Value Ref Range Status   Specimen Description BLOOD LEFT HAND  Final   Special Requests BOTTLES DRAWN  AEROBIC AND ANAEROBIC 5 CC EA  Final   Culture   Final    NO GROWTH 2 DAYS Performed at [facility]    Report Status PENDING  Incomplete     Scheduled Meds: . chlorhexidine  15 mL Mouth/Throat BID  . feeding supplement (ENSURE ENLIVE)  237 mL Oral BID BM  . feeding supplement (PRO-STAT SUGAR FREE 64)  30 mL Oral BID  . hydrocerin   Topical BID  . multivitamin with minerals  1 tablet Oral Daily  . piperacillin-tazobactam (ZOSYN)  IV  3.375 g Intravenous Q8H  .  vancomycin  1,000 mg Intravenous Q24H   Continuous Infusions: . sodium chloride 75 mL/hr at 03/09/15 0335

## 2015-03-10 NOTE — Progress Notes (Signed)
Pharmacy Antibiotic Note  April Graves is a 60 y.o. quadriplegic female admitted on 03/07/2015 with left thigh decub ulcer stage 4 currently on vancomycin and zosyn for broad coverage for suspected infection.  Patient is being managed with wound care and no surgical intervention is needed at this time per [facility].  - afeb, wbc wnl, scr <0.3-- quadriplegic (crcl~58, rounded scr to 0.8)   Plan: - continue Vancomycin 1g IV q24h - continue Zosyn 3.375g IV q8h (infuse over 4 hours) - please indicate plan for abx.  Pharmacy will plan on checking vancomycin trough level on 03/11/15 if to continue with IV abx   Temp (24hrs), Avg:98.3 F (36.8 C), Min:98.2 F (36.8 C), Max:98.3 F (36.8 C)   Recent Labs Lab 03/07/15 1326 03/07/15 2310 03/08/15 0605  WBC 10.3 7.9 6.6  CREATININE <0.30* <0.30* <0.30*    CrCl cannot be calculated (Patient has no serum creatinine result on file.).    No Known Allergies  Antimicrobials this admission: 1/30 >> Vancomycin  >>  1/30  >> Zosyn >>   Dose adjustments this admission: No prior vancomycin levels  Microbiology results: 1/30 BCx x2: ngtd 1/30 UCx: neg FINAL  Thank you for allowing pharmacy to be a part of this patient's care.  Dia Sitter, PharmD, BCPS 03/10/2015 11:09 AM

## 2015-03-10 NOTE — Care Management Note (Signed)
Case Management Note  Patient Details  Name: April Graves MRN: [id number] Date of Birth: September 24, 1955  Subjective/Objective:  Admitted with decubitus ulcer needing hydrotherapy                  Action/Plan: Discharge planning per CSW  Expected Discharge Date:   (unknown)               Expected Discharge Plan:  Skilled Nursing Facility  In-House Referral:  Clinical Social Work  Discharge planning Services  CM Consult  Post Acute Care Choice:  NA Choice offered to:  Patient  DME Arranged:  N/A DME Agency:  NA  HH Arranged:  NA HH Agency:  NA  Status of Service:  Completed, signed off  Medicare Important Message Given:  Yes Date Medicare IM Given:    Medicare IM give by:    Date Additional Medicare IM Given:    Additional Medicare Important Message give by:     If discussed at [place] of Stay Meetings, dates discussed:    Additional Comments:  Guadalupe Maple, RN 03/10/2015, 11:04 AM

## 2015-03-11 LAB — BASIC METABOLIC PANEL
Anion gap: 10 (ref 5–15)
BUN: 8 mg/dL (ref 6–20)
CALCIUM: 8.7 mg/dL — AB (ref 8.9–10.3)
CO2: 24 mmol/L (ref 22–32)
Chloride: 107 mmol/L (ref 101–111)
Creatinine, Ser: 0.47 mg/dL (ref 0.44–1.00)
GFR calc non Af Amer: >60 mL/min (ref >60)
Glucose, Bld: 144 mg/dL — ABNORMAL HIGH (ref 65–99)
Potassium: 3.2 mmol/L — ABNORMAL LOW (ref 3.5–5.1)
Sodium: 141 mmol/L (ref 135–145)

## 2015-03-11 LAB — GLUCOSE, CAPILLARY: GLUCOSE-CAPILLARY: 89 mg/dL (ref 65–99)

## 2015-03-11 LAB — CBC
HEMATOCRIT: 31.1 % — AB (ref 36.0–46.0)
Hemoglobin: 9.8 g/dL — ABNORMAL LOW (ref 12.0–15.0)
MCH: 24.7 pg — AB (ref 26.0–34.0)
MCHC: 31.5 g/dL (ref 30.0–36.0)
MCV: 78.3 fL (ref 78.0–100.0)
Platelets: 310 10*3/uL (ref 150–400)
RBC: 3.97 MIL/uL (ref 3.87–5.11)
RDW: 13.7 % (ref 11.5–15.5)
WBC: 6.9 10*3/uL (ref 4.0–10.5)

## 2015-03-11 LAB — VANCOMYCIN, TROUGH: VANCOMYCIN TR: 6 ug/mL — AB (ref 10.0–20.0)

## 2015-03-11 MED ORDER — OXYCODONE-ACETAMINOPHEN 5-325 MG PO TABS
1.0000 | ORAL_TABLET | Freq: Four times a day (QID) | ORAL | Status: DC | PRN
  Administered 2015-03-11 – 2015-03-14 (×9): 1 via ORAL
  Filled 2015-03-11 (×8): qty 1

## 2015-03-11 MED ORDER — VANCOMYCIN HCL IN DEXTROSE 750-5 MG/150ML-% IV SOLN
750.0000 mg | Freq: Two times a day (BID) | INTRAVENOUS | Status: DC
  Administered 2015-03-12 – 2015-03-14 (×5): 750 mg via INTRAVENOUS
  Filled 2015-03-11 (×6): qty 150

## 2015-03-11 NOTE — Progress Notes (Signed)
Patient ID: April Graves, female   DOB: [birth year], 60 y.o.   MRN: [id number] [facility] PROGRESS NOTE  April Graves [id number] DOB: [birth year] DOA: 03/07/2015 PCP: Jearld Shines with [place]   Brief narrative:    60 y.o. female, no significant medical issues, last time seen in ED in 07/2014 for hip and ankle pain who now presented to [facility] ED because of worsening left thigh ulceration with foul smelling discharge, pus drainage. Patient and her daughter not sure how long this has been there and caregiver has not really told them about the wound.  In ED, BP was 107/53, HR 108, T max 98.3 F, RR 16. Blood work showed hemoglobin 9.2, potassium 3.3 which was supplemented. WOC and surgery saw the pt in consultation. Pt started on vanco and zosyn.  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 Dry, edematous peeling  - Measurement:left hip: 4cm x 6cm x 4.5cm with significant undermining and tissue destruction from 10-3 o'clock. The greatest depth is at 1 o'clock and measures 6cm.The stage 3 pressure injury to the left buttock measures 2.5cm x 1cm x 0.2cm with sloughing epidermis and some subcutaneous yellow tissue evident with scant exudate. Bilateral LEs are edematous, skin on feet is peeling but there is no ulceration. - Dressing procedure/placement/frequency:Stage 4 pressure injury - twice daily and PRN dressing. The left buttock - silicone foam dressing. The bilateral feet - wash twice daily and ad Eucerin Cream. - Pressure redistribution provided via Prevalon Boots. - She s receiving hydrotherapy - She is on vanco and zosyn for now. She has declined MRI of the pelvis for further evaluation. - Surgery has signed off. Hydrotherapy can be stopped upon discharge. From that point she can just continue wound care as noted above. - When she is ready for discharge will change  the antibiotic to Augmentin and doxycycline for about 2 weeks on discharge.   Functional quadriplegia - Art gallery manager assisting discharge planning - Patient is agreeable to skilled nursing facility placement   Anemia of chronic disease - Hemoglobin stable at 9.8 - No current indications for transfusion   Hypokalemia - Likely related to poor nutritional status - Supplemented - Check BMP today   DVT Prophylaxis  - SCD's bilaterally, prevalon boots    Code Status: Full.  Family Communication:  plan of care discussed with the patient Disposition Plan: Probably to skilled nursing facility once we know which facility available   IV access:  Peripheral IV  Procedures and diagnostic studies:    Dg Chest 2 View 03/07/2015  No acute cardiopulmonary disease. Electronically Signed   By: Evangeline Dakin M.D.   On: 03/07/2015 12:16   Medical Consultants:  Surgery  Other Consultants:  PT WOC  IAnti-Infectives:   Vanco and zosyn 03/07/2015 -->   Leisa Lenz, MD  [facility] Pager [phone]  Time spent in minutes: 25 minutes  If 7PM-7AM, please contact night-coverage [email or web address] Password [facility] 03/11/2015, 10:33 AM   LOS: 4 days    HPI/Subjective: No acute overnight events. Feels better.   Objective: Filed Vitals:   03/10/15 0602 03/10/15 1500 03/10/15 2200 03/11/15 0630  BP: 118/77 135/90 132/94 128/78  Pulse: 103 107 106 104  Temp: 98.2 F (36.8 C) 98.2 F (36.8 C) 98.8 F (37.1 C) 98.2 F (36.8 C)  TempSrc:  Oral Oral Oral  Resp: 20 20 20 20   Height:  Weight:      SpO2: 99% 100% 99% 99%    Intake/Output Summary (Last 24 hours) at 03/11/15 1033 Last data filed at 03/11/15 0600  Gross per 24 hour  Intake    150 ml  Output      1 ml  Net    149 ml    Exam:   General:  Pt is not in distress   Cardiovascular: (+) S1, S2, RRR  Respiratory: no wheezing, no rhonchi   Abdomen: (+) BS, non tender   Neuro:  Nonfocal  Skin:    Data Reviewed: Basic Metabolic Panel:  Recent Labs Lab 03/07/15 1326 03/07/15 2310 03/08/15 0605 03/11/15 0913  NA 132* 133* 134* 141  K 3.3* 3.5 4.1 3.2*  CL 98* 102 105 107  CO2 25 24 24 24   GLUCOSE 106* 105* 93 144*  BUN 9 6 5* 8  CREATININE <0.30* <0.30* <0.30* 0.47  CALCIUM 8.9 8.7* 8.8* 8.7*  MG  --  1.9  --   --   PHOS  --  2.8  --   --    Liver Function Tests:  Recent Labs Lab 03/07/15 1326 03/07/15 2310 03/08/15 0605  AST 18 19 15   ALT 11* 10* 10*  ALKPHOS 148* 123 120  BILITOT 0.5 0.6 0.5  PROT 7.5 6.4* 6.4*  ALBUMIN 2.4* 2.0* 2.0*   No results for input(s): LIPASE, AMYLASE in the last 168 hours. No results for input(s): AMMONIA in the last 168 hours. CBC:  Recent Labs Lab 03/07/15 1326 03/07/15 2310 03/08/15 0605 03/11/15 0913  WBC 10.3 7.9 6.6 6.9  NEUTROABS 8.0* 5.1  --   --   HGB 9.2* 9.6* 9.4* 9.8*  HCT 28.7* 29.5* 29.6* 31.1*  MCV 79.3 77.8* 79.6 78.3  PLT 506* 371 455* 310   Cardiac Enzymes: No results for input(s): CKTOTAL, CKMB, CKMBINDEX, TROPONINI in the last 168 hours. BNP: Invalid input(s): POCBNP CBG:  Recent Labs Lab 03/08/15 0813 03/09/15 0731 03/10/15 0738 03/11/15 0755  GLUCAP 116* 102* 90 89    Recent Results (from the past 240 hour(s))  Urine culture     Status: None   Collection Time: 03/07/15 12:26 PM  Result Value Ref Range Status   Specimen Description URINE, CATHETERIZED  Final   Special Requests Normal  Final   Culture   Final    NO GROWTH 1 DAY Performed at [facility]    Report Status 03/08/2015 FINAL  Final  Blood Cultures x 2 sites     Status: None (Preliminary result)   Collection Time: 03/07/15  1:26 PM  Result Value Ref Range Status   Specimen Description BLOOD RIGHT ANTECUBITAL  Final   Special Requests IN PEDIATRIC BOTTLE 4 CC EA  Final   Culture   Final    NO GROWTH 3 DAYS Performed at [facility]    Report Status PENDING  Incomplete  Blood  Cultures x 2 sites     Status: None (Preliminary result)   Collection Time: 03/07/15  5:30 PM  Result Value Ref Range Status   Specimen Description BLOOD LEFT HAND  Final   Special Requests BOTTLES DRAWN AEROBIC AND ANAEROBIC 5 CC EA  Final   Culture   Final    NO GROWTH 3 DAYS Performed at [facility]    Report Status PENDING  Incomplete     Scheduled Meds: . chlorhexidine  15 mL Mouth/Throat BID  . feeding supplement (ENSURE ENLIVE)  237 mL Oral BID  BM  . feeding supplement (PRO-STAT SUGAR FREE 64)  30 mL Oral BID  . hydrocerin   Topical BID  . multivitamin with minerals  1 tablet Oral Daily  . piperacillin-tazobactam (ZOSYN)  IV  3.375 g Intravenous Q8H  . vancomycin  1,000 mg Intravenous Q24H   Continuous Infusions: . sodium chloride 75 mL/hr at 03/11/15 0745

## 2015-03-11 NOTE — Progress Notes (Signed)
Pharmacy Consult Note - Vancomcyin Follow Up  Labs: vanc trough 6  A/P: Vanc trough subtherapeutic (goal 10-15 mcg/mL) on dose 1g IV q24. Will change dose to 750mg  IV q12 and recheck trough as appropriate   Adrian Saran, PharmD, BCPS Pager [phone] 03/11/2015 6:23 PM

## 2015-03-11 NOTE — Progress Notes (Signed)
Nutrition Follow-up  DOCUMENTATION CODES:   Not applicable  INTERVENTION:  - Continue Ensure Enlive BID and Prostat BID - RD will continue to monitor for needs  NUTRITION DIAGNOSIS:   Increased nutrient needs related to wound healing as evidenced by estimated needs. -ongoing  GOAL:   Patient will meet greater than or equal to 90% of their needs -likely unmet  MONITOR:   PO intake, Supplement acceptance, Skin, I & O's, Labs, Weight trends  ASSESSMENT:   60 y.o. female, no significant medical issues, last time seen in ED in 07/2014 for hip and ankle pain who now presented to [facility] ED because of worsening left thigh ulceration with foul smelling discharge, pus drainage. Patient and her daughter not sure how long this has been there and caregiver has not really told them about the wound.  2/3 Per chart review, pt eating 25-50% of meals since last assessment with 50% completion of sub from [facility] for breakfast this AM. Pt requested that RD order lunch which was done for her. She requested baked chicken, rice, gravy, side salad with ranch dressing. Noted many missing teeth. Pt states difficulty chewing related to this and states that she has been ordering soft foods due to this. Pt states that she likes Ensure and Prostat and has been using them each day.   Pt not meeting needs. Needs have been adjusted since last assessment to better reflect needs with stage 3 and 4 wounds. Medications reviewed. Labs reviewed; CBGs: 89-116 mg/dL, K: 3.2 mmol/L, Ca: 8.7 mg/dL.     1/31 - Pt was receiving wound care during time of visit.  - She endorses weight loss but unable to recount an amount.  - Per chart, pt has lost 8#/5% in 7 months, an insignificant amount.  - Pt endorses a good appetite at home.  - Unable to recount time she has had multiple wounds, caregiver did not make her aware of them.  - Nutrition-Focused physical exam completed. Findings are mild fat depletion, no muscle depletion, and  moderate edema.    Diet Order:  Diet regular Room service appropriate?: Yes; Fluid consistency:: Thin  Skin:  Wound (see comment) (Stage 4 L hip and stage 3 L buttocks pressure ulcers)  Last BM:  1/31  Height:   Ht Readings from Last 1 Encounters:  03/07/15 5\' 4"  (1.626 m)    Weight:   Wt Readings from Last 1 Encounters:  03/09/15 107 lb 5.8 oz (48.7 kg)    Ideal Body Weight:  54.54 kg  BMI:  Body mass index is 18.42 kg/(m^2).  Estimated Nutritional Needs:   Kcal:  1705-1950 (35-40 kcal/kg)  Protein:  87-97 grams (1.8-2 grams/kg)  Fluid:  >/= 2 L/day  EDUCATION NEEDS:   No education needs identified at this time     Jarome Matin, RD, LDN Inpatient Clinical Dietitian Pager # [phone] After hours/weekend pager # [phone]

## 2015-03-11 NOTE — Clinical Social Work Placement (Signed)
   CLINICAL SOCIAL WORK PLACEMENT  NOTE  Date:  03/11/2015  Patient Details  Name: April Graves MRN: [id number] Date of Birth: 27-Jul-1955  Clinical Social Work is seeking post-discharge placement for this patient at the [place] level of care (*CSW will initial, date and re-position this form in  chart as items are completed):  Yes   Patient/family provided with [place] Work Department's list of facilities offering this level of care within the geographic area requested by the patient (or if unable, by the patient's family).  Yes   Patient/family informed of their freedom to choose among providers that offer the needed level of care, that participate in Medicare, Medicaid or managed care program needed by the patient, have an available bed and are willing to accept the patient.  Yes   Patient/family informed of [place]'s ownership interest in [facility] and [facility], as well as of the fact that they are under no obligation to receive care at these facilities.  PASRR submitted to EDS on 03/11/15     PASRR number received on 03/11/15     Existing PASRR number confirmed on       FL2 transmitted to all facilities in geographic area requested by pt/family on 03/11/15     FL2 transmitted to all facilities within larger geographic area on       Patient informed that his/her managed care company has contracts with or will negotiate with certain facilities, including the following:            Patient/family informed of bed offers received.  Patient chooses bed at       Physician recommends and patient chooses bed at      Patient to be transferred to   on  .  Patient to be transferred to facility by       Patient family notified on   of transfer.  Name of family member notified:        PHYSICIAN       Additional Comment:    _______________________________________________ Luretha Rued, [place] 03/11/2015,  3:25 PM

## 2015-03-11 NOTE — Progress Notes (Signed)
Physical Therapy Wound Treatment Patient Details  Name: April Graves MRN: [id number] Date of Birth: 03/08/1955  Today's Date: 03/11/2015 Time:  1335-1400     Subjective  Patient and Family Stated Goals: To heal wounds Date of Onset: 02/22/15 Prior Treatments: Dressing changes at home; surgical consult  Pain Score: Pain Score: 0-No pain  Wound Assessment  Dressing Type ABD;Moist to dry;Hypafix  Dressing Changed Changed  Dressing Status Old drainage  Dressing Change Frequency Daily  Site / Wound Assessment Granulation tissue;Clean  % Wound base Red or Granulating 95%(difficult to fully assess all areas due to undermining/depth))  % Wound base Yellow 5% (difficult to fully assess all areas due to undermining/depth)  Margins Unattached edges (unapproximated)  Closure None  Drainage Amount Moderate  Drainage Description Sanguineous;No odor  Non-staged Wound Description Full thickness  Treatment Hydrotherapy (Pulse lavage);Packing (Saline gauze);Tape changed   Hydrotherapy Pulsed lavage therapy - wound location: L hip over greater troch Pulsed Lavage with Suction (psi): 8 psi Pulsed Lavage with Suction - Normal Saline Used: 1000 mL Pulsed Lavage Tip: Tip with splash shield   Wound Assessment and Plan  Wound Therapy - Assess/Plan/Recommendations Wound Therapy - Clinical Statement: Patient presents with pressure ulcer with decreased mobility, poor nutritional status and limited ability to reposition and will benefit from skilled PT for hydrotherapy and selective debridement to assist in wound healing, positioning and education for prevention. Wound Therapy - Functional Problem List: immobility, decreased nutritional status Factors Delaying/Impairing Wound Healing: Immobility;Multiple medical problems Hydrotherapy Plan: Pulsatile lavage with suction;Patient/family education;Dressing change Wound Therapy - Frequency: 6X / week Wound Therapy - Current Recommendations: Case  manager/social work Wound Therapy - Follow Up Recommendations: Skilled nursing facility  Wound Therapy Goals- Improve the function of patient's integumentary system by progressing the wound(s) through the phases of wound healing (inflammation - proliferation - remodeling) by: Decrease Necrotic Tissue - Progress: Progressing toward goal Increase Granulation Tissue - Progress: Progressing toward goal Decrease Length/Width/Depth - Progress: Progressing toward goal Patient/Family Instruction Goal - Progress: Progressing toward goal Goals/treatment plan/discharge plan were made with and agreed upon by patient/family: Yes Time For Goal Achievement: 7 days Wound Therapy - Potential for Goals: Good  Goals will be updated until maximal potential achieved or discharge criteria met.  Discharge criteria: when goals achieved, discharge from hospital, MD decision/surgical intervention, no progress towards goals, refusal/missing three consecutive treatments without notification or medical reason.  GP     Weston Anna, MPT Pager: [phone]

## 2015-03-11 NOTE — Clinical Social Work Note (Signed)
Clinical Social Work Assessment  Patient Details  Name: HONESTEE COPPLER MRN: [id number] Date of Birth: 1955/05/19  Date of referral:  03/11/15               Reason for consult:  Facility Placement, Discharge Planning                Permission sought to share information with:  Chartered certified accountant granted to share information::  Yes, Verbal Permission Granted  Name::        Agency::     Relationship::     Contact Information:     Housing/Transportation Living arrangements for the past 2 months:  Single Family Home Source of Information:  Patient Patient Interpreter Needed:  None Criminal Activity/Legal Involvement Pertinent to Current Situation/Hospitalization:  No - Comment as needed Significant Relationships:  Adult Children Lives with:  Adult Children Do you feel safe going back to the place where you live?  No (SNF placement recommended.) Need for family participation in patient care:  No (Coment)  Care giving concerns:  Pt's care cannot be managed at home following hospital d/c.   Social Worker assessment / plan:  Pt hospitalized on 03/07/15 due to a Decubitus ulcer of left thigh / left hip. CSW has been consulted to assist with d/c planning. Pt has been followed by PT for hydrotherapy. PT has recommended SNF placement at d/c for continued wound care. Pt will not require hydrotherapy at SNF. Dressing changes 2x daily will be needed. PT will assess pt this afternoon to see if pt would benefit from physical therapy at SNF. Pt reports that she was able to trans from bed to w/c prior to admission. Pt is in agreement with ST SNF for continued wound care. CSW will initiate SNF search following PT recommendations this afternoon. CSW will assist with [facility] authorization once PT recommendations are available.  Employment status:  Unemployed Nurse, adult PT Recommendations:  [place] / Referral to  community resources:  [place]  Patient/Family's Response to care:  Pt feels ST SNF placement is needed.  Patient/Family's Understanding of and Emotional Response to Diagnosis, Current Treatment, and Prognosis:   Pt is aware of her medical status. " My wound is healing. I know I need help with wound care. " Emotional Assessment Appearance:  Appears stated age Attitude/Demeanor/Rapport:  Other (cooperative) Affect (typically observed):  Calm, Appropriate, Pleasant Orientation:  Oriented to Self, Oriented to Place, Oriented to  Time, Oriented to Situation Alcohol / Substance use:  Not Applicable Psych involvement (Current and /or in the community):  No (Comment)  Discharge Needs  Concerns to be addressed:  Discharge Planning Concerns Readmission within the last 30 days:  No Current discharge risk:  None Barriers to Discharge:  No Barriers Identified   Luretha Rued, [place] 03/11/2015, 11:49 AM

## 2015-03-11 NOTE — Evaluation (Addendum)
Physical Therapy Evaluation Patient Details Name: BERNECE CASSADAY MRN: [id number] DOB: Oct 03, 1955 Today's Date: 03/11/2015   History of Present Illness  60 yo female admitted from home with decubitus ulcers, functional quadriplegia.   Clinical Impression  On eval, pt required Max assist to roll and Max assist +2 for supine<>sit. Sat EOB at least 5 minutes with Min- Min guard assist as time progressed. Recommend trial of PT to improve bed mobility, sitting balance, continued education on positioning to allow for healing of decubitus ulcers, and to continue to assess ability for bed to chair transfers.     Follow Up Recommendations SNF     Equipment Recommendations  None recommended by PT    Recommendations for Other Services       Precautions / Restrictions Precautions Precautions: Fall Restrictions Weight Bearing Restrictions: No      Mobility  Bed Mobility Overal bed mobility: Needs Assistance Bed Mobility: Rolling;Sidelying to Sit;Sit to Sidelying Rolling: Max assist Sidelying to sit: +2 for physical assistance;+2 for safety/equipment;Max assist     Sit to sidelying: +2 for physical assistance;+2 for safety/equipment;Max assist General bed mobility comments: Assist for trunk and bil LEs. Utilized bedpad for scooting, positioning. Increased time.   Transfers                 General transfer comment: NT  Ambulation/Gait             General Gait Details: Pt is non ambulatory  Stairs            Wheelchair Mobility    Modified Rankin (Stroke Patients Only)       Balance                                             Pertinent Vitals/Pain Pain Assessment: Faces Faces Pain Scale: Hurts even more Pain Location: bil LEs when mobilized Pain Descriptors / Indicators: Sore Pain Intervention(s): Monitored during session;Repositioned    Home Living Family/patient expects to be discharged to:: Unsure Living Arrangements:  Children                    Prior Function Level of Independence: Needs assistance   Gait / Transfers Assistance Needed: per pt, assist for transfers at some point.      Comments: unsure of PLOF. Pt had some difficulty providing info. 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 Strength ~2/5 within available range. 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 Strength ~1/5 throughout.  Cervical / Trunk Assessment: Kyphotic  Communication      Cognition Arousal/Alertness: Awake/alert Behavior During Therapy: WFL for tasks assessed/performed Overall Cognitive Status: No family/caregiver present to determine baseline cognitive functioning                      General Comments      Exercises        Assessment/Plan    PT Assessment Patient needs continued PT services  PT Diagnosis Generalized weakness   PT Problem List Decreased strength;Decreased range of motion;Decreased activity tolerance;Decreased balance;Pain;Decreased mobility;Decreased knowledge of use of DME;Decreased cognition  PT Treatment Interventions Functional mobility training;Therapeutic activities;Patient/family education;Therapeutic exercise;Balance training   PT Goals (Current goals can be found in the Care Plan section) Acute Rehab PT Goals Patient Stated Goal: for wounds to heal PT Goal Formulation: With patient Time For Goal Achievement: 03/25/15 Potential to Achieve Goals: Fair-Poor    Frequency Min 2X/week   Barriers to discharge        Co-evaluation               End of Session   Activity Tolerance: Patient limited by pain Patient left: in bed;with call bell/phone within reach;with bed alarm set            Time: [id number] PT Time Calculation (min) (ACUTE ONLY): 16 min   Charges:   PT Evaluation $PT Eval Moderate Complexity: 1 Procedure     PT G Codes:        Weston Anna, MPT Pager: [phone]

## 2015-03-11 NOTE — NC FL2 (Signed)
[place] LEVEL OF CARE SCREENING TOOL     IDENTIFICATION  Patient Name: April Graves Birthdate: 1955-05-17 Sex: female Admission Date (Current Location): 03/07/2015  [facility] and [place] Number:  Herbalist and Address:  [facility],  [place] [address], [place]      Provider Number: [id number]  Attending Physician Name and Address:  Robbie Lis, MD  Relative Name and Phone Number:       Current Level of Care: Hospital Recommended Level of Care: [place] Prior Approval Number:    Date Approved/Denied:   PASRR Number: [id number]  Discharge Plan: SNF    Current Diagnoses: Patient Active Problem List   Diagnosis Date Noted  . Decubitus ulcer of thigh, stage 4 ([place]) 03/08/2015  . Functional quadriplegia ([place]) 03/07/2015  . Anemia of chronic disease 03/07/2015  . Hypokalemia 10/15/2011    Orientation RESPIRATION BLADDER Height & Weight     Self, Time, Situation, Place  Normal Continent Weight: 48.7 kg (107 lb 5.8 oz) Height:  5\' 4"  (162.6 cm)  BEHAVIORAL SYMPTOMS/MOOD NEUROLOGICAL BOWEL NUTRITION STATUS  Other (Comment) (No Behaviors)   Continent Diet  AMBULATORY STATUS COMMUNICATION OF NEEDS Skin   Total Care Verbally  (Stage 4 decub left thigh, Stage 3 decub left hip)   Dressing changes 2x daily  Air mattress needed                       Personal Care Assistance Level of Assistance  Bathing, Feeding, Dressing Bathing Assistance: Limited assistance Feeding assistance: Independent Dressing Assistance: Limited assistance     Functional Limitations Info  Sight, Hearing, Speech Sight Info: Adequate Hearing Info: Adequate Speech Info: Adequate    SPECIAL CARE FACTORS FREQUENCY  PT (By licensed PT)     PT Frequency: 5 x wk ( transfers )              Contractures Contractures Info: Present    Additional Factors Info  Code Status Code Status Info: Full               Current Medications (03/11/2015):  This is the current hospital active medication list Current Facility-Administered Medications  Medication Dose Route Frequency Provider Last Rate Last Dose  . 0.9 %  sodium chloride infusion   Intravenous Continuous Robbie Lis, MD 10 mL/hr at 03/11/15 1050    . acetaminophen (TYLENOL) tablet 650 mg  650 mg Oral Q6H PRN Robbie Lis, MD   650 mg at 03/09/15 [id number]   Or  . acetaminophen (TYLENOL) suppository 650 mg  650 mg Rectal Q6H PRN Robbie Lis, MD      . albuterol (PROVENTIL) (2.5 MG/3ML) 0.083% nebulizer solution 2.5 mg  2.5 mg Nebulization Q6H PRN Minda Ditto, RPH      . chlorhexidine (PERIDEX) 0.12 % solution 15 mL  15 mL Mouth/Throat BID Robbie Lis, MD   15 mL at 03/11/15 0956  . feeding supplement (ENSURE ENLIVE) (ENSURE ENLIVE) liquid 237 mL  237 mL Oral BID BM Robbie Lis, MD   237 mL at 03/11/15 0955  . feeding supplement (PRO-STAT SUGAR FREE 64) liquid 30 mL  30 mL Oral BID Satira Anis Ward, RD   30 mL at 03/11/15 0959  . hydrocerin (EUCERIN) cream   Topical BID Robbie Lis, MD      . HYDROmorphone (DILAUDID) injection 1 mg  1 mg Intravenous Q4H PRN Robbie Lis,  MD   1 mg at 03/11/15 0238  . multivitamin with minerals tablet 1 tablet  1 tablet Oral Daily Robbie Lis, MD   1 tablet at 03/11/15 1000  . ondansetron (ZOFRAN) tablet 4 mg  4 mg Oral Q6H PRN Robbie Lis, MD       Or  . ondansetron [facility]) injection 4 mg  4 mg Intravenous Q6H PRN Robbie Lis, MD      . oxyCODONE-acetaminophen (PERCOCET/ROXICET) 5-325 MG per tablet 1 tablet  1 tablet Oral Q6H PRN Robbie Lis, MD   1 tablet at 03/11/15 1319  . piperacillin-tazobactam (ZOSYN) IVPB 3.375 g  3.375 g Intravenous Q8H Robbie Lis, MD   3.375 g at 03/11/15 0955  . vancomycin (VANCOCIN) IVPB 1000 mg/200 mL premix  1,000 mg Intravenous Q24H Robbie Lis, MD   1,000 mg at 03/10/15 1745     Discharge Medications: Please see discharge summary for a list of discharge  medications.  Relevant Imaging Results:  Relevant Lab Results:   Additional Information SS # [id number]  April Graves, April An, LCSW

## 2015-03-12 LAB — CULTURE, BLOOD (ROUTINE X 2)
Culture: NO GROWTH
Culture: NO GROWTH

## 2015-03-12 LAB — GLUCOSE, CAPILLARY: Glucose-Capillary: 96 mg/dL (ref 65–99)

## 2015-03-12 NOTE — Progress Notes (Signed)
PHYSICAL THERAPY   03/12/15 13:40  Wound / Incision (Open or Dehisced) 03/08/15 Other (Comment) Hip Left  Date First Assessed/Time First Assessed: 03/08/15 1520   Wound Type: (c) Other (Comment)  Location: Hip  Location Orientation: Left  Present on Admission: Yes  Dressing Type ABD;Moist to dry;Gauze (Comment)  Dressing Status Clean;Old drainage  Dressing Change Frequency Daily  % Wound base Red or Granulating 97%  % Wound base Yellow 3%  Margins Unattached edges (unapproximated)  Closure None  Drainage Amount Moderate  Drainage Description No odor;Sanguineous  Non-staged Wound Description Full thickness  Hydrotherapy  Pulsed lavage therapy - wound location L hip over greater troch  Pulsed Lavage with Suction (psi) 8 psi  Pulsed Lavage with Suction - Normal Saline Used 1000 mL  Pulsed Lavage Tip Tip with splash shield   Lightly saline packed Kerlix then ABD pad  Increased time need to clean pt before session.  Found in bed incont of urine.  Tolerated Tx well.  Repositioned to comfort.   Rica Koyanagi  PTA [facility]  Acute  Rehab Pager      [phone]

## 2015-03-12 NOTE — Progress Notes (Addendum)
Patient ID: April Graves, female   DOB: [birth year], 60 y.o.   MRN: [id number] [facility] PROGRESS NOTE  KOURTNEY KNAPPER [id number] DOB: 06/14/1955 DOA: 03/07/2015 PCP: Jearld Shines with [facility]   Brief narrative:    60 y.o. female, no significant medical issues, last time seen in ED in 07/2014 for hip and ankle pain who now presented to [facility] ED because of worsening left thigh ulceration with foul smelling discharge, pus drainage. Patient and her daughter not sure how long this has been there and caregiver has not really told them about the wound.  In ED, BP was 107/53, HR 108, T max 98.3 F, RR 16. Blood work showed hemoglobin 9.2, potassium 3.3 which was supplemented. WOC and surgery saw the pt in consultation. Pt started on vanco and zosyn.  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  - Measurement:left hip: 4cm x 6cm x 4.5cm with significant undermining and tissue destruction from 10-3 o'clock. The greatest depth is at 1 o'clock and measures 6cm.The stage 3 pressure injury to the left buttock measures 2.5cm x 1cm x 0.2cm with sloughing epidermis and some subcutaneous yellow tissue evident with scant exudate. Bilateral LEs are edematous, skin on feet is peeling but there is no ulceration. - Dressing procedure/placement/frequency:Stage 4 pressure injury - twice daily and PRN dressing. The left buttock - silicone foam dressing. The bilateral feet - wash twice daily and ad Eucerin Cream. - Pressure redistribution provided via Prevalon Boots. - She is on vanco and zosyn. She has declined MRI of the pelvis for further evaluation. - Surgery has signed off. Hydrotherapy can be stopped upon discharge. From that point she can just continue wound care as noted above. - When she is ready for discharge will change the antibiotic to Augmentin and  doxycycline for about 2 weeks on discharge.   Functional quadriplegia - Appreciate social worker assisting discharge planning - To SNF by 03/14/2015    Anemia of chronic disease - Hemoglobin stable at 9.8   Hypokalemia - Likely related to poor nutritional status - Supplemented - Check BMP tomorrow   DVT Prophylaxis  - Prevalon boots    Code Status: Full.  Family Communication:  plan of care discussed with the patient Disposition Plan: Probably to skilled nursing facility 03/14/2015  IV access:  Peripheral IV  Procedures and diagnostic studies:    Dg Chest 2 View 03/07/2015  No acute cardiopulmonary disease. Electronically Signed   By: Evangeline Dakin M.D.   On: 03/07/2015 12:16   Medical Consultants:  Surgery  Other Consultants:  PT WOC  IAnti-Infectives:   Vanco and zosyn 03/07/2015 -->   Leisa Lenz, MD  [facility] Pager [phone]  Time spent in minutes: 25 minutes  If 7PM-7AM, please contact night-coverage [email or web address] Password [email or web address] 03/12/2015, 11:19 AM   LOS: 5 days    HPI/Subjective: No acute overnight events. Feels better today, in better spirits.   Objective: Filed Vitals:   03/11/15 0630 03/11/15 1446 03/11/15 2247 03/12/15 0523  BP: 128/78 120/85 144/88 143/84  Pulse: 104 102 102 86  Temp: 98.2 F (36.8 C) 98.8 F (37.1 C) 98.8 F (37.1 C) 98 F (36.7 C)  TempSrc: Oral Oral Oral Oral  Resp: 20 18 18 18   Height:      Weight:    106 lb (48.081 kg)  SpO2: 99% 99%  100% 100%    Intake/Output Summary (Last 24 hours) at 03/12/15 1119 Last data filed at 03/12/15 0835  Gross per 24 hour  Intake    660 ml  Output      0 ml  Net    660 ml    Exam:   General:  Pt is alert, awake   Cardiovascular: Rate controlled, appreciate S1,, S2  Respiratory: bilateral air entry, no wheezing   Abdomen: non tender, no distended   Neuro: No focal deficits   Skin: left thigh ulcer, healing in progress, bilateral edema (+)    Data  Reviewed: Basic Metabolic Panel:  Recent Labs Lab 03/07/15 1326 03/07/15 2310 03/08/15 0605 03/11/15 0913  NA 132* 133* 134* 141  K 3.3* 3.5 4.1 3.2*  CL 98* 102 105 107  CO2 25 24 24 24   GLUCOSE 106* 105* 93 144*  BUN 9 6 5* 8  CREATININE <0.30* <0.30* <0.30* 0.47  CALCIUM 8.9 8.7* 8.8* 8.7*  MG  --  1.9  --   --   PHOS  --  2.8  --   --    Liver Function Tests:  Recent Labs Lab 03/07/15 1326 03/07/15 2310 03/08/15 0605  AST 18 19 15   ALT 11* 10* 10*  ALKPHOS 148* 123 120  BILITOT 0.5 0.6 0.5  PROT 7.5 6.4* 6.4*  ALBUMIN 2.4* 2.0* 2.0*   No results for input(s): LIPASE, AMYLASE in the last 168 hours. No results for input(s): AMMONIA in the last 168 hours. CBC:  Recent Labs Lab 03/07/15 1326 03/07/15 2310 03/08/15 0605 03/11/15 0913  WBC 10.3 7.9 6.6 6.9  NEUTROABS 8.0* 5.1  --   --   HGB 9.2* 9.6* 9.4* 9.8*  HCT 28.7* 29.5* 29.6* 31.1*  MCV 79.3 77.8* 79.6 78.3  PLT 506* 371 455* 310   Cardiac Enzymes: No results for input(s): CKTOTAL, CKMB, CKMBINDEX, TROPONINI in the last 168 hours. BNP: Invalid input(s): POCBNP CBG:  Recent Labs Lab 03/08/15 0813 03/09/15 0731 03/10/15 0738 03/11/15 0755 03/12/15 0750  GLUCAP 116* 102* 90 89 96    Recent Results (from the past 240 hour(s))  Urine culture     Status: None   Collection Time: 03/07/15 12:26 PM  Result Value Ref Range Status   Specimen Description URINE, CATHETERIZED  Final   Special Requests Normal  Final   Culture   Final    NO GROWTH 1 DAY Performed at [facility]    Report Status 03/08/2015 FINAL  Final  Blood Cultures x 2 sites     Status: None (Preliminary result)   Collection Time: 03/07/15  1:26 PM  Result Value Ref Range Status   Specimen Description BLOOD RIGHT ANTECUBITAL  Final   Special Requests IN PEDIATRIC BOTTLE 4 CC EA  Final   Culture   Final    NO GROWTH 4 DAYS Performed at [facility]    Report Status PENDING  Incomplete  Blood Cultures x 2  sites     Status: None (Preliminary result)   Collection Time: 03/07/15  5:30 PM  Result Value Ref Range Status   Specimen Description BLOOD LEFT HAND  Final   Special Requests BOTTLES DRAWN AEROBIC AND ANAEROBIC 5 CC EA  Final   Culture   Final    NO GROWTH 4 DAYS Performed at [facility]    Report Status PENDING  Incomplete     Scheduled Meds: . chlorhexidine  15 mL Mouth/Throat BID  . feeding supplement (  ENSURE ENLIVE)  237 mL Oral BID BM  . feeding supplement (PRO-STAT SUGAR FREE 64)  30 mL Oral BID  . hydrocerin   Topical BID  . multivitamin with minerals  1 tablet Oral Daily  . piperacillin-tazobactam (ZOSYN)  IV  3.375 g Intravenous Q8H  . vancomycin  750 mg Intravenous Q12H   Continuous Infusions: . sodium chloride 10 mL/hr at 03/11/15 1050

## 2015-03-12 NOTE — Progress Notes (Addendum)
Clinical Social Work  CSW provided bed offers to pt and son. Pt reports they will have a decision made within the next few hours so CSW can contact insurance for approval. Pt aware of insurance approval that is needed for SNF placement.   Sindy Messing, LCSW Weekend Coverage  [place] spoke with pt who is requesting [facility] at [facility] alerted [facility] who will review chart for authorization for SNF.  [place] provided and valid for DC on 2/4 and 2/5 [id number]. CSW left a message with [facility] explaining pt's acceptance of bed. Per chart review, pt might not be ready to DC until 2/6 but authorization is valid if pt is ready to DC on 2/5.

## 2015-03-13 LAB — CBC
HCT: 27.6 % — ABNORMAL LOW (ref 36.0–46.0)
HEMOGLOBIN: 8.7 g/dL — AB (ref 12.0–15.0)
MCH: 25.5 pg — ABNORMAL LOW (ref 26.0–34.0)
MCHC: 31.5 g/dL (ref 30.0–36.0)
MCV: 80.9 fL (ref 78.0–100.0)
PLATELETS: 304 10*3/uL (ref 150–400)
RBC: 3.41 MIL/uL — AB (ref 3.87–5.11)
RDW: 14.3 % (ref 11.5–15.5)
WBC: 5.2 10*3/uL (ref 4.0–10.5)

## 2015-03-13 LAB — BASIC METABOLIC PANEL
ANION GAP: 5 (ref 5–15)
BUN: 9 mg/dL (ref 6–20)
CHLORIDE: 105 mmol/L (ref 101–111)
CO2: 28 mmol/L (ref 22–32)
CREATININE: 0.31 mg/dL — AB (ref 0.44–1.00)
Calcium: 8.4 mg/dL — ABNORMAL LOW (ref 8.9–10.3)
GFR calc non Af Amer: >60 mL/min (ref >60)
Glucose, Bld: 90 mg/dL (ref 65–99)
POTASSIUM: 3.1 mmol/L — AB (ref 3.5–5.1)
SODIUM: 138 mmol/L (ref 135–145)

## 2015-03-13 LAB — GLUCOSE, CAPILLARY: Glucose-Capillary: 99 mg/dL (ref 65–99)

## 2015-03-13 MED ORDER — ACETAMINOPHEN 325 MG PO TABS: 650.0000 mg | ORAL_TABLET | Freq: Four times a day (QID) | ORAL | Status: DC | PRN

## 2015-03-13 MED ORDER — POTASSIUM CHLORIDE CRYS ER 20 MEQ PO TBCR
40.0000 meq | EXTENDED_RELEASE_TABLET | Freq: Once | ORAL | Status: AC
  Administered 2015-03-13: 40 meq via ORAL
  Filled 2015-03-13: qty 2

## 2015-03-13 MED ORDER — OXYCODONE-ACETAMINOPHEN 5-325 MG PO TABS: 1.0000 | ORAL_TABLET | ORAL | Status: DC | PRN

## 2015-03-13 MED ORDER — HYDROCERIN EX CREA: 1.0000 "application " | TOPICAL_CREAM | Freq: Two times a day (BID) | CUTANEOUS | Status: DC

## 2015-03-13 MED ORDER — ENSURE ENLIVE PO LIQD: 237.0000 mL | Freq: Two times a day (BID) | ORAL | Status: DC

## 2015-03-13 MED ORDER — AMOXICILLIN-POT CLAVULANATE 875-125 MG PO TABS: 1.0000 | ORAL_TABLET | Freq: Two times a day (BID) | ORAL | Status: DC

## 2015-03-13 MED ORDER — PRO-STAT SUGAR FREE PO LIQD: 30.0000 mL | Freq: Two times a day (BID) | ORAL | Status: DC

## 2015-03-13 MED ORDER — DOXYCYCLINE HYCLATE 100 MG PO TABS: 100.0000 mg | ORAL_TABLET | Freq: Two times a day (BID) | ORAL | Status: DC

## 2015-03-13 MED ORDER — ONDANSETRON HCL 4 MG PO TABS: 4.0000 mg | ORAL_TABLET | Freq: Four times a day (QID) | ORAL | Status: DC | PRN

## 2015-03-13 NOTE — Discharge Summary (Signed)
Physician Discharge Summary  April Graves [id number] DOB: 1955/12/03 DOA: 03/07/2015  PCP: No PCP Per Patient - will follow up with MD per SNF  Admit date: 03/07/2015 Discharge date: 03/14/2015  Recommendations for Outpatient Follow-up:  Take Augmentin and doxycyline for 7 days on discharge. Please continue wound care.   Pt has stage 4 pressure injury to left hip, stage 3 pressure injury to left buttock.   Measurement:left hip: 4cm x 6cm x 4.5cm with significant undermining and tissue destruction from 10-3 o'clock. The greatest depth is at 1 o'clock and measures 6cm. The stage 3 pressure injury to the left buttock measures 2.5cm x 1cm x 0.2cm with sloughing epidermis and some subcutaneous yellow tissue evident with scant exudate.  Bilateral LEs are edematous, skin on feet is peeling but there is no ulceration.  Dressing procedure/placement/frequency:Stage 4 pressure injury - twice daily and PRN dressing. The left buttock - silicone foam dressing. The bilateral feet - wash twice daily and ad Eucerin Cream. - Pressure redistribution provided via Prevalon Boots.  Discharge Diagnoses:  Principal Problem:   Decubitus ulcer of thigh, stage 4 ([facility]) Active Problems:   Hypokalemia   Functional quadriplegia ([facility])   Anemia of chronic disease    Discharge Condition: stable   Diet recommendation: as tolerated   History of present illness:  60 y.o. female, no significant medical issues, last time seen in ED in 07/2014 for hip and ankle pain who now presented to [facility] ED because of worsening left thigh ulceration with foul smelling discharge, pus drainage. Patient and her daughter not sure how long this has been there and caregiver has not really told them about the wound.  In ED, BP was 107/53, HR 108, T max 98.3 F, RR 16. Blood work showed hemoglobin 9.2, potassium 3.3 which was supplemented. WOC and surgery saw the pt in consultation. Pt started on vanco and zosyn.  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  - Measurement:left hip: 4cm x 6cm x 4.5cm with significant undermining and tissue destruction from 10-3 o'clock. The greatest depth is at 1 o'clock and measures 6cm.The stage 3 pressure injury to the left buttock measures 2.5cm x 1cm x 0.2cm with sloughing epidermis and some subcutaneous yellow tissue evident with scant exudate. Bilateral LEs are edematous, skin on feet is peeling but there is no ulceration. - Dressing procedure/placement/frequency:Stage 4 pressure injury - twice daily and PRN dressing. The left buttock - silicone foam dressing. The bilateral feet - wash twice daily and ad Eucerin Cream. - Pressure redistribution provided via Prevalon Boots. - She is on vanco and zosyn. She has declined MRI of the pelvis for further evaluation. - Surgery has signed off. Hydrotherapy can be stopped upon discharge.  - Continue Augmentin and doxycycline for 1 week on discahrge.   Functional quadriplegia - Art gallery manager assisting discharge planning   Anemia of chronic disease - Hemoglobin stable   Hypokalemia - Likely related to poor nutritional status - Supplemented  DVT Prophylaxis  - Prevalon boots    Code Status: Full.  Family Communication: plan of care discussed with the patient   IV access:  Peripheral IV  Procedures and diagnostic studies:   Dg Chest 2 View 03/07/2015 No acute cardiopulmonary disease. Electronically Signed By: Evangeline Dakin M.D. On: 03/07/2015 12:16   Medical Consultants:  Surgery  Other Consultants:  PT WOC  IAnti-Infectives:   Vanco and zosyn 03/07/2015 --> 03/14/2015 Augmentin and doxycyline for 7 days on discharge    Signed:  Leisa Lenz, MD  [facility] 03/13/2015, 12:55 PM  Pager #:  [phone]  Time spent in minutes: more than 30 minutes    Discharge Exam: Filed Vitals:   03/12/15 2300 03/13/15 0500  BP: 133/92 133/81  Pulse: 98 90  Temp: 98.9 F (37.2 C) 97.7 F (36.5 C)  Resp: 20 16   Filed Vitals:   03/12/15 0523 03/12/15 1421 03/12/15 2300 03/13/15 0500  BP: 143/84 151/80 133/92 133/81  Pulse: 86 112 98 90  Temp: 98 F (36.7 C) 98.3 F (36.8 C) 98.9 F (37.2 C) 97.7 F (36.5 C)  TempSrc: Oral Oral Oral Oral  Resp: 18 18 20 16   Height:      Weight: 106 lb (48.081 kg)   107 lb 5.8 oz (48.7 kg)  SpO2: 100% 100% 99% 99%    General: Pt is alert, follows commands appropriately, not in acute distress Cardiovascular: Regular rate and rhythm, S1/S2 +, no murmurs Respiratory: Clear to auscultation bilaterally, no wheezing, no crackles, no rhonchi Abdominal: Soft, non tender, non distended, bowel sounds +, no guarding Extremities: bilateral edema, palpable bilaterally DP and PT Neuro: Grossly nonfocal  Discharge Instructions  Discharge Instructions    Call MD for:  difficulty breathing, headache or visual disturbances    Complete by:  As directed      Call MD for:  persistant dizziness or light-headedness    Complete by:  As directed      Call MD for:  persistant nausea and vomiting    Complete by:  As directed      Call MD for:  severe uncontrolled pain    Complete by:  As directed      Diet - low sodium heart healthy    Complete by:  As directed      Discharge instructions    Complete by:  As directed   Take Augmentin and doxycyline for 7 days on discharge. Please continue wound care.     Increase activity slowly    Complete by:  As directed             Medication List    STOP taking these medications        ibuprofen 200 MG tablet  Commonly known as:  ADVIL,MOTRIN     ibuprofen 800 MG tablet  Commonly known as:  ADVIL,MOTRIN      TAKE these medications        acetaminophen 325 MG tablet  Commonly known as:  TYLENOL  Take  2 tablets (650 mg total) by mouth every 6 (six) hours as needed for mild pain (or Fever >/= 101).     albuterol 108 (90 Base) MCG/ACT inhaler  Commonly known as:  PROVENTIL HFA;VENTOLIN HFA  Inhale 1-2 puffs into the lungs every 6 (six) hours as needed for wheezing or shortness of breath.     amoxicillin-clavulanate 875-125 MG tablet  Commonly known as:  AUGMENTIN  Take 1 tablet by mouth 2 (two) times daily.     doxycycline 100 MG tablet  Commonly known as:  VIBRA-TABS  Take 1 tablet (100 mg total) by mouth 2 (two) times daily.     feeding supplement (ENSURE ENLIVE) Liqd  Take 237 mLs by mouth 2 (two) times daily between meals.     feeding supplement (PRO-STAT SUGAR FREE 64) Liqd  Take 30 mLs by mouth 2 (two) times daily.     hydrocerin Crea  Apply 1 application topically 2 (two) times daily.     meloxicam 15 MG tablet  Commonly known as:  MOBIC  Take 15 mg by mouth 2 (two) times daily as needed for pain.     multivitamin with minerals Tabs tablet  Take 1 tablet by mouth daily.     ondansetron 4 MG tablet  Commonly known as:  ZOFRAN  Take 1 tablet (4 mg total) by mouth every 6 (six) hours as needed for nausea.     oxyCODONE-acetaminophen 5-325 MG tablet  Commonly known as:  PERCOCET  Take 1 tablet by mouth every 4 (four) hours as needed for severe pain.     VITAMIN D PO  Take 1 tablet by mouth 2 (two) times daily.          The results of significant diagnostics from this hospitalization (including imaging, microbiology, ancillary and laboratory) are listed below for reference.    Significant Diagnostic Studies: Dg Chest 2 View  03/07/2015  CLINICAL DATA:  60 year old wheelchair-bound patient presenting with high-pressure ulceration involving the left buttock pain with bilateral lower extremity edema. Worsening generalized weakness over the past several days. EXAM: CHEST  2 VIEW COMPARISON:  03/18/2012 and earlier, including CTA chest of that same date. FINDINGS: AP  semi-erect and lateral images were obtained. Cardiomediastinal silhouette unremarkable, unchanged. Lungs clear. Bronchovascular markings normal. Pulmonary vascularity normal. No visible pleural effusions. No pneumothorax. Osseous demineralization. Modeling of the humeral heads bilaterally with severe degenerative changes in the shoulder joints. IMPRESSION: No acute cardiopulmonary disease. Electronically Signed   By: Evangeline Dakin M.D.   On: 03/07/2015 12:16    Microbiology: Recent Results (from the past 240 hour(s))  Urine culture     Status: None   Collection Time: 03/07/15 12:26 PM  Result Value Ref Range Status   Specimen Description URINE, CATHETERIZED  Final   Special Requests Normal  Final   Culture   Final    NO GROWTH 1 DAY Performed at [facility]    Report Status 03/08/2015 FINAL  Final  Blood Cultures x 2 sites     Status: None   Collection Time: 03/07/15  1:26 PM  Result Value Ref Range Status   Specimen Description BLOOD RIGHT ANTECUBITAL  Final   Special Requests IN PEDIATRIC BOTTLE 4 CC EA  Final   Culture   Final    NO GROWTH 5 DAYS Performed at [facility]    Report Status 03/12/2015 FINAL  Final  Blood Cultures x 2 sites     Status: None   Collection Time: 03/07/15  5:30 PM  Result Value Ref Range Status   Specimen Description BLOOD LEFT HAND  Final   Special Requests BOTTLES DRAWN AEROBIC AND ANAEROBIC 5 CC EA  Final   Culture   Final    NO GROWTH 5 DAYS Performed at [facility]    Report Status 03/12/2015 FINAL  Final     Labs: Basic Metabolic Panel:  Recent Labs Lab 03/07/15 1326 03/07/15 2310 03/08/15 0605 03/11/15 0913 03/13/15 0620  NA 132* 133* 134* 141 138  K 3.3* 3.5 4.1 3.2* 3.1*  CL 98* 102 105 107 105  CO2 25 24 24 24 28   GLUCOSE 106* 105* 93 144* 90  BUN 9 6 5* 8 9  CREATININE <0.30* <0.30* <0.30* 0.47 0.31*  CALCIUM 8.9 8.7* 8.8* 8.7* 8.4*  MG  --  1.9  --   --   --   PHOS  --  2.8  --   --   --     Liver Function Tests:  Recent Labs Lab 03/07/15 1326 03/07/15 2310 03/08/15 0605  AST 18 19 15   ALT 11* 10* 10*  ALKPHOS 148* 123 120  BILITOT 0.5 0.6 0.5  PROT 7.5 6.4* 6.4*  ALBUMIN 2.4* 2.0* 2.0*   No results for input(s): LIPASE, AMYLASE in the last 168 hours. No results for input(s): AMMONIA in the last 168 hours. CBC:  Recent Labs Lab 03/07/15 1326 03/07/15 2310 03/08/15 0605 03/11/15 0913 03/13/15 0620  WBC 10.3 7.9 6.6 6.9 5.2  NEUTROABS 8.0* 5.1  --   --   --   HGB 9.2* 9.6* 9.4* 9.8* 8.7*  HCT 28.7* 29.5* 29.6* 31.1* 27.6*  MCV 79.3 77.8* 79.6 78.3 80.9  PLT 506* 371 455* 310 304   Cardiac Enzymes: No results for input(s): CKTOTAL, CKMB, CKMBINDEX, TROPONINI in the last 168 hours. BNP: BNP (last 3 results) No results for input(s): BNP in the last 8760 hours.  ProBNP (last 3 results) No results for input(s): PROBNP in the last 8760 hours.  CBG:  Recent Labs Lab 03/09/15 0731 03/10/15 0738 03/11/15 0755 03/12/15 0750 03/13/15 0707  GLUCAP 102* 90 89 96 99

## 2015-03-13 NOTE — Discharge Instructions (Signed)
Amoxicillin; Clavulanic Acid chewable tablets What is this medicine? AMOXICILLIN; CLAVULANIC ACID (a mox i SIL in; KLAV yoo lan ic AS id) is a penicillin antibiotic. It is used to treat certain kinds of bacterial infections. It It will not work for colds, flu, or other viral infections. This medicine may be used for other purposes; ask your health care provider or pharmacist if you have questions. What should I tell my health care provider before I take this medicine? They need to know if you have any of these conditions: -bowel disease, like colitis -kidney disease -liver disease -mononucleosis -phenylketonuria -an unusual or allergic reaction to amoxicillin, penicillin, cephalosporin, other antibiotics, clavulanic acid, other medicines, foods, dyes, or preservatives -pregnant or trying to get pregnant -breast-feeding How should I use this medicine? Take this medicine by mouth. Chew it completely before swallowing. Follow the directions on the prescription label. Take this medicine at the start of a meal or snack. Take your medicine at regular intervals. Do not take your medicine more often than directed. Take all of your medicine as directed even if you think you are better. Do not skip doses or stop your medicine early. Talk to your pediatrician regarding the use of this medicine in children. While this drug may be prescribed for selected conditions, precautions do apply. Overdosage: If you think you have taken too much of this medicine contact a poison control center or emergency room at once. NOTE: This medicine is only for you. Do not share this medicine with others. What if I miss a dose? If you miss a dose, take it as soon as you can. If it is almost time for your next dose, take only that dose. Do not take double or extra doses. What may interact with this medicine? -allopurinol -anticoagulants -birth control pills -methotrexate -probenecid This list may not describe all possible  interactions. Give your health care provider a list of all the medicines, herbs, non-prescription drugs, or dietary supplements you use. Also tell them if you smoke, drink alcohol, or use illegal drugs. Some items may interact with your medicine. What should I watch for while using this medicine? Tell your doctor or health care professional if your symptoms do not improve. Do not treat diarrhea with over the counter products. Contact your doctor if you have diarrhea that lasts more than 2 days or if it is severe and watery. If you have diabetes, you may get a false-positive result for sugar in your urine. Check with your doctor or health care professional. Birth control pills may not work properly while you are taking this medicine. Talk to your doctor about using an extra method of birth control. What side effects may I notice from receiving this medicine? Side effects that you should report to your doctor or health care professional as soon as possible: -allergic reactions like skin rash, itching or hives, swelling of the face, lips, or tongue -breathing problems -dark urine -fever or chills, sore throat -redness, blistering, peeling or loosening of the skin, including inside the mouth -seizures -trouble passing urine or change in the amount of urine -unusual bleeding, bruising -unusually weak or tired -white patches or sores in the mouth or throat Side effects that usually do not require medical attention (report to your doctor or health care professional if they continue or are bothersome): -diarrhea -dizziness -headache -nausea, vomiting -stomach upset -vaginal or anal irritation This list may not describe all possible side effects. Call your doctor for medical advice about side effects. You  may report side effects to FDA at 1-800-FDA-1088. Where should I keep my medicine? Keep out of the reach of children. Store at room temperature below 25 degrees C (77 degrees F). Keep container  tightly closed. Throw away any unused medicine after the expiration date. NOTE: This sheet is a summary. It may not cover all possible information. If you have questions about this medicine, talk to your doctor, pharmacist, or health care provider.     2016, Elsevier/Gold Standard. (2007-04-17 11:38:22)   Doxycycline tablets or capsules What is this medicine? DOXYCYCLINE (dox i SYE kleen) is a tetracycline antibiotic. It kills certain bacteria or stops their growth. It is used to treat many kinds of infections, like dental, skin, respiratory, and urinary tract infections. It also treats acne, Lyme disease, malaria, and certain sexually transmitted infections. This medicine may be used for other purposes; ask your health care provider or pharmacist if you have questions. What should I tell my health care provider before I take this medicine? They need to know if you have any of these conditions: -liver disease -long exposure to sunlight like working outdoors -stomach problems like colitis -an unusual or allergic reaction to doxycycline, tetracycline antibiotics, other medicines, foods, dyes, or preservatives -pregnant or trying to get pregnant -breast-feeding How should I use this medicine? Take this medicine by mouth with a full glass of water. Follow the directions on the prescription label. It is best to take this medicine without food, but if it upsets your stomach take it with food. Take your medicine at regular intervals. Do not take your medicine more often than directed. Take all of your medicine as directed even if you think you are better. Do not skip doses or stop your medicine early. Talk to your pediatrician regarding the use of this medicine in children. While this drug may be prescribed for selected conditions, precautions do apply. Overdosage: If you think you have taken too much of this medicine contact a poison control center or emergency room at once. NOTE: This medicine is  only for you. Do not share this medicine with others. What if I miss a dose? If you miss a dose, take it as soon as you can. If it is almost time for your next dose, take only that dose. Do not take double or extra doses. What may interact with this medicine? -antacids -barbiturates -birth control pills -bismuth subsalicylate -carbamazepine -methoxyflurane -other antibiotics -phenytoin -vitamins that contain iron -warfarin This list may not describe all possible interactions. Give your health care provider a list of all the medicines, herbs, non-prescription drugs, or dietary supplements you use. Also tell them if you smoke, drink alcohol, or use illegal drugs. Some items may interact with your medicine. What should I watch for while using this medicine? Tell your doctor or health care professional if your symptoms do not improve. Do not treat diarrhea with over the counter products. Contact your doctor if you have diarrhea that lasts more than 2 days or if it is severe and watery. Do not take this medicine just before going to bed. It may not dissolve properly when you lay down and can cause pain in your throat. Drink plenty of fluids while taking this medicine to also help reduce irritation in your throat. This medicine can make you more sensitive to the sun. Keep out of the sun. If you cannot avoid being in the sun, wear protective clothing and use sunscreen. Do not use sun lamps or tanning beds/booths. Birth control pills  may not work properly while you are taking this medicine. Talk to your doctor about using an extra method of birth control. If you are being treated for a sexually transmitted infection, avoid sexual contact until you have finished your treatment. Your sexual partner may also need treatment. Avoid antacids, aluminum, calcium, magnesium, and iron products for 4 hours before and 2 hours after taking a dose of this medicine. If you are using this medicine to prevent malaria,  you should still protect yourself from contact with mosquitos. Stay in screened-in areas, use mosquito nets, keep your body covered, and use an insect repellent. What side effects may I notice from receiving this medicine? Side effects that you should report to your doctor or health care professional as soon as possible: -allergic reactions like skin rash, itching or hives, swelling of the face, lips, or tongue -difficulty breathing -fever -itching in the rectal or genital area -pain on swallowing -redness, blistering, peeling or loosening of the skin, including inside the mouth -severe stomach pain or cramps -unusual bleeding or bruising -unusually weak or tired -yellowing of the eyes or skin Side effects that usually do not require medical attention (report to your doctor or health care professional if they continue or are bothersome): -diarrhea -loss of appetite -nausea, vomiting This list may not describe all possible side effects. Call your doctor for medical advice about side effects. You may report side effects to FDA at 1-800-FDA-1088. Where should I keep my medicine? Keep out of the reach of children. Store at room temperature, below 30 degrees C (86 degrees F). Protect from light. Keep container tightly closed. Throw away any unused medicine after the expiration date. Taking this medicine after the expiration date can make you seriously ill. NOTE: This sheet is a summary. It may not cover all possible information. If you have questions about this medicine, talk to your doctor, pharmacist, or health care provider.    2016, Elsevier/Gold Standard. (2014-05-14 12:10:28)

## 2015-03-14 DIAGNOSIS — R532 Functional quadriplegia: Secondary | ICD-10-CM

## 2015-03-14 DIAGNOSIS — E876 Hypokalemia: Secondary | ICD-10-CM

## 2015-03-14 DIAGNOSIS — L89894 Pressure ulcer of other site, stage 4: Secondary | ICD-10-CM

## 2015-03-14 DIAGNOSIS — D638 Anemia in other chronic diseases classified elsewhere: Secondary | ICD-10-CM

## 2015-03-14 LAB — GLUCOSE, CAPILLARY: Glucose-Capillary: 81 mg/dL (ref 65–99)

## 2015-03-14 NOTE — Progress Notes (Signed)
03/14/15 April Graves [phone] gave report to antionette.

## 2015-03-14 NOTE — Progress Notes (Signed)
Patient for d/c today to SNF bed at [place] rec'd from [facility] [id number].  Patient agreeable to this plan- plan transfer via EMS. Son- Tajuan aware and to complete paperwork for transfer.  Eduard Clos, MSW, Latanya Presser [phone]

## 2015-03-14 NOTE — Progress Notes (Signed)
PT Cancellation Note  Patient Details Name: JOSLYNN JOSHUA MRN: [id number] DOB: 12/12/1955   Cancelled Treatment:    Reason Eval/Treat Not Completed: Other (comment) (chart indicates DC to SNF today. .will f/u tomotrrow if not. )   Claretha Cooper 03/14/2015, 3:09 PM

## 2015-03-14 NOTE — Progress Notes (Signed)
Patient seen and examined at the bedside. Patient is medically stable for discharge to skilled nursing facility today. Please refer to discharge summary completed 03/13/2015. No changes in medical management since 03/13/2015.  Leisa Lenz [facility] [phone]

## 2015-03-17 ENCOUNTER — Encounter (HOSPITAL_BASED_OUTPATIENT_CLINIC_OR_DEPARTMENT_OTHER): Payer: Commercial Managed Care - HMO | Attending: Internal Medicine

## 2015-03-17 DIAGNOSIS — E43 Unspecified severe protein-calorie malnutrition: Secondary | ICD-10-CM | POA: Insufficient documentation

## 2015-03-17 DIAGNOSIS — R532 Functional quadriplegia: Secondary | ICD-10-CM | POA: Insufficient documentation

## 2015-03-17 DIAGNOSIS — E119 Type 2 diabetes mellitus without complications: Secondary | ICD-10-CM | POA: Insufficient documentation

## 2015-03-17 DIAGNOSIS — L89324 Pressure ulcer of left buttock, stage 4: Secondary | ICD-10-CM | POA: Insufficient documentation

## 2015-03-17 DIAGNOSIS — I10 Essential (primary) hypertension: Secondary | ICD-10-CM | POA: Insufficient documentation

## 2015-03-17 DIAGNOSIS — J449 Chronic obstructive pulmonary disease, unspecified: Secondary | ICD-10-CM | POA: Insufficient documentation

## 2015-03-17 DIAGNOSIS — Z681 Body mass index (BMI) 19 or less, adult: Secondary | ICD-10-CM | POA: Diagnosis not present

## 2015-03-31 DIAGNOSIS — L89324 Pressure ulcer of left buttock, stage 4: Secondary | ICD-10-CM | POA: Diagnosis not present

## 2015-04-14 ENCOUNTER — Encounter (HOSPITAL_BASED_OUTPATIENT_CLINIC_OR_DEPARTMENT_OTHER): Payer: Medicare HMO | Attending: Internal Medicine

## 2015-04-14 DIAGNOSIS — I10 Essential (primary) hypertension: Secondary | ICD-10-CM | POA: Insufficient documentation

## 2015-04-14 DIAGNOSIS — R532 Functional quadriplegia: Secondary | ICD-10-CM | POA: Insufficient documentation

## 2015-04-14 DIAGNOSIS — J449 Chronic obstructive pulmonary disease, unspecified: Secondary | ICD-10-CM | POA: Diagnosis not present

## 2015-04-14 DIAGNOSIS — E119 Type 2 diabetes mellitus without complications: Secondary | ICD-10-CM | POA: Insufficient documentation

## 2015-04-14 DIAGNOSIS — L89324 Pressure ulcer of left buttock, stage 4: Secondary | ICD-10-CM | POA: Diagnosis not present

## 2015-05-10 DIAGNOSIS — M130 Polyarthritis, unspecified: Secondary | ICD-10-CM | POA: Diagnosis not present

## 2015-05-10 DIAGNOSIS — R531 Weakness: Secondary | ICD-10-CM | POA: Diagnosis not present

## 2015-05-10 DIAGNOSIS — S31829A Unspecified open wound of left buttock, initial encounter: Secondary | ICD-10-CM | POA: Diagnosis not present

## 2015-05-10 DIAGNOSIS — K029 Dental caries, unspecified: Secondary | ICD-10-CM | POA: Diagnosis not present

## 2015-05-10 DIAGNOSIS — E46 Unspecified protein-calorie malnutrition: Secondary | ICD-10-CM | POA: Diagnosis not present

## 2015-05-20 DIAGNOSIS — S82209A Unspecified fracture of shaft of unspecified tibia, initial encounter for closed fracture: Secondary | ICD-10-CM | POA: Diagnosis not present

## 2015-05-20 DIAGNOSIS — L89004 Pressure ulcer of unspecified elbow, stage 4: Secondary | ICD-10-CM | POA: Diagnosis not present

## 2015-05-20 DIAGNOSIS — G8929 Other chronic pain: Secondary | ICD-10-CM | POA: Diagnosis not present

## 2015-05-20 DIAGNOSIS — G8250 Quadriplegia, unspecified: Secondary | ICD-10-CM | POA: Diagnosis not present

## 2015-05-23 DIAGNOSIS — G8929 Other chronic pain: Secondary | ICD-10-CM | POA: Diagnosis not present

## 2015-05-23 DIAGNOSIS — S82209A Unspecified fracture of shaft of unspecified tibia, initial encounter for closed fracture: Secondary | ICD-10-CM | POA: Diagnosis not present

## 2015-05-23 DIAGNOSIS — L89004 Pressure ulcer of unspecified elbow, stage 4: Secondary | ICD-10-CM | POA: Diagnosis not present

## 2015-05-23 DIAGNOSIS — G8250 Quadriplegia, unspecified: Secondary | ICD-10-CM | POA: Diagnosis not present

## 2015-06-19 DIAGNOSIS — L89004 Pressure ulcer of unspecified elbow, stage 4: Secondary | ICD-10-CM | POA: Diagnosis not present

## 2015-06-19 DIAGNOSIS — G8250 Quadriplegia, unspecified: Secondary | ICD-10-CM | POA: Diagnosis not present

## 2015-06-19 DIAGNOSIS — S82209A Unspecified fracture of shaft of unspecified tibia, initial encounter for closed fracture: Secondary | ICD-10-CM | POA: Diagnosis not present

## 2015-06-19 DIAGNOSIS — G8929 Other chronic pain: Secondary | ICD-10-CM | POA: Diagnosis not present

## 2015-06-22 DIAGNOSIS — G8250 Quadriplegia, unspecified: Secondary | ICD-10-CM | POA: Diagnosis not present

## 2015-06-22 DIAGNOSIS — L89004 Pressure ulcer of unspecified elbow, stage 4: Secondary | ICD-10-CM | POA: Diagnosis not present

## 2015-06-22 DIAGNOSIS — S82209A Unspecified fracture of shaft of unspecified tibia, initial encounter for closed fracture: Secondary | ICD-10-CM | POA: Diagnosis not present

## 2015-06-22 DIAGNOSIS — G8929 Other chronic pain: Secondary | ICD-10-CM | POA: Diagnosis not present

## 2015-07-20 DIAGNOSIS — G8250 Quadriplegia, unspecified: Secondary | ICD-10-CM | POA: Diagnosis not present

## 2015-07-20 DIAGNOSIS — S82209A Unspecified fracture of shaft of unspecified tibia, initial encounter for closed fracture: Secondary | ICD-10-CM | POA: Diagnosis not present

## 2015-07-20 DIAGNOSIS — L89004 Pressure ulcer of unspecified elbow, stage 4: Secondary | ICD-10-CM | POA: Diagnosis not present

## 2015-07-20 DIAGNOSIS — G8929 Other chronic pain: Secondary | ICD-10-CM | POA: Diagnosis not present

## 2015-07-23 DIAGNOSIS — S82209A Unspecified fracture of shaft of unspecified tibia, initial encounter for closed fracture: Secondary | ICD-10-CM | POA: Diagnosis not present

## 2015-07-23 DIAGNOSIS — G8250 Quadriplegia, unspecified: Secondary | ICD-10-CM | POA: Diagnosis not present

## 2015-07-23 DIAGNOSIS — L89004 Pressure ulcer of unspecified elbow, stage 4: Secondary | ICD-10-CM | POA: Diagnosis not present

## 2015-07-23 DIAGNOSIS — G8929 Other chronic pain: Secondary | ICD-10-CM | POA: Diagnosis not present

## 2015-08-14 DIAGNOSIS — M545 Low back pain: Secondary | ICD-10-CM | POA: Diagnosis not present

## 2015-08-14 DIAGNOSIS — Z6824 Body mass index (BMI) 24.0-24.9, adult: Secondary | ICD-10-CM | POA: Diagnosis not present

## 2015-08-19 DIAGNOSIS — S82209A Unspecified fracture of shaft of unspecified tibia, initial encounter for closed fracture: Secondary | ICD-10-CM | POA: Diagnosis not present

## 2015-08-19 DIAGNOSIS — L89004 Pressure ulcer of unspecified elbow, stage 4: Secondary | ICD-10-CM | POA: Diagnosis not present

## 2015-08-19 DIAGNOSIS — G8250 Quadriplegia, unspecified: Secondary | ICD-10-CM | POA: Diagnosis not present

## 2015-08-19 DIAGNOSIS — G8929 Other chronic pain: Secondary | ICD-10-CM | POA: Diagnosis not present

## 2015-08-22 DIAGNOSIS — L89004 Pressure ulcer of unspecified elbow, stage 4: Secondary | ICD-10-CM | POA: Diagnosis not present

## 2015-08-22 DIAGNOSIS — G8250 Quadriplegia, unspecified: Secondary | ICD-10-CM | POA: Diagnosis not present

## 2015-08-22 DIAGNOSIS — G8929 Other chronic pain: Secondary | ICD-10-CM | POA: Diagnosis not present

## 2015-08-22 DIAGNOSIS — S82209A Unspecified fracture of shaft of unspecified tibia, initial encounter for closed fracture: Secondary | ICD-10-CM | POA: Diagnosis not present

## 2015-09-19 DIAGNOSIS — G8929 Other chronic pain: Secondary | ICD-10-CM | POA: Diagnosis not present

## 2015-09-19 DIAGNOSIS — G8250 Quadriplegia, unspecified: Secondary | ICD-10-CM | POA: Diagnosis not present

## 2015-09-19 DIAGNOSIS — S82209A Unspecified fracture of shaft of unspecified tibia, initial encounter for closed fracture: Secondary | ICD-10-CM | POA: Diagnosis not present

## 2015-09-19 DIAGNOSIS — L89004 Pressure ulcer of unspecified elbow, stage 4: Secondary | ICD-10-CM | POA: Diagnosis not present

## 2015-09-22 DIAGNOSIS — L89004 Pressure ulcer of unspecified elbow, stage 4: Secondary | ICD-10-CM | POA: Diagnosis not present

## 2015-09-22 DIAGNOSIS — G8250 Quadriplegia, unspecified: Secondary | ICD-10-CM | POA: Diagnosis not present

## 2015-09-22 DIAGNOSIS — G8929 Other chronic pain: Secondary | ICD-10-CM | POA: Diagnosis not present

## 2015-09-22 DIAGNOSIS — S82209A Unspecified fracture of shaft of unspecified tibia, initial encounter for closed fracture: Secondary | ICD-10-CM | POA: Diagnosis not present

## 2015-10-06 ENCOUNTER — Encounter: Payer: Self-pay | Admitting: Family Medicine

## 2015-10-06 ENCOUNTER — Ambulatory Visit: Payer: Medicare Other | Admitting: Family Medicine

## 2015-10-14 DIAGNOSIS — L89894 Pressure ulcer of other site, stage 4: Secondary | ICD-10-CM | POA: Diagnosis not present

## 2015-10-14 DIAGNOSIS — R532 Functional quadriplegia: Secondary | ICD-10-CM | POA: Diagnosis not present

## 2015-10-14 DIAGNOSIS — M25569 Pain in unspecified knee: Secondary | ICD-10-CM | POA: Diagnosis not present

## 2015-10-20 DIAGNOSIS — S82209A Unspecified fracture of shaft of unspecified tibia, initial encounter for closed fracture: Secondary | ICD-10-CM | POA: Diagnosis not present

## 2015-10-20 DIAGNOSIS — G8250 Quadriplegia, unspecified: Secondary | ICD-10-CM | POA: Diagnosis not present

## 2015-10-20 DIAGNOSIS — G8929 Other chronic pain: Secondary | ICD-10-CM | POA: Diagnosis not present

## 2015-10-20 DIAGNOSIS — L89004 Pressure ulcer of unspecified elbow, stage 4: Secondary | ICD-10-CM | POA: Diagnosis not present

## 2015-10-22 DIAGNOSIS — Z9181 History of falling: Secondary | ICD-10-CM | POA: Diagnosis not present

## 2015-10-22 DIAGNOSIS — R532 Functional quadriplegia: Secondary | ICD-10-CM | POA: Diagnosis not present

## 2015-10-22 DIAGNOSIS — L89894 Pressure ulcer of other site, stage 4: Secondary | ICD-10-CM | POA: Diagnosis not present

## 2015-10-22 DIAGNOSIS — M25569 Pain in unspecified knee: Secondary | ICD-10-CM | POA: Diagnosis not present

## 2015-10-23 DIAGNOSIS — L89004 Pressure ulcer of unspecified elbow, stage 4: Secondary | ICD-10-CM | POA: Diagnosis not present

## 2015-10-23 DIAGNOSIS — G8929 Other chronic pain: Secondary | ICD-10-CM | POA: Diagnosis not present

## 2015-10-23 DIAGNOSIS — S82209A Unspecified fracture of shaft of unspecified tibia, initial encounter for closed fracture: Secondary | ICD-10-CM | POA: Diagnosis not present

## 2015-10-23 DIAGNOSIS — G8250 Quadriplegia, unspecified: Secondary | ICD-10-CM | POA: Diagnosis not present

## 2015-10-24 DIAGNOSIS — M25569 Pain in unspecified knee: Secondary | ICD-10-CM | POA: Diagnosis not present

## 2015-10-24 DIAGNOSIS — L89894 Pressure ulcer of other site, stage 4: Secondary | ICD-10-CM | POA: Diagnosis not present

## 2015-10-24 DIAGNOSIS — R532 Functional quadriplegia: Secondary | ICD-10-CM | POA: Diagnosis not present

## 2015-10-24 DIAGNOSIS — Z9181 History of falling: Secondary | ICD-10-CM | POA: Diagnosis not present

## 2015-10-25 DIAGNOSIS — L89894 Pressure ulcer of other site, stage 4: Secondary | ICD-10-CM | POA: Diagnosis not present

## 2015-10-25 DIAGNOSIS — Z9181 History of falling: Secondary | ICD-10-CM | POA: Diagnosis not present

## 2015-10-25 DIAGNOSIS — R532 Functional quadriplegia: Secondary | ICD-10-CM | POA: Diagnosis not present

## 2015-10-25 DIAGNOSIS — M25569 Pain in unspecified knee: Secondary | ICD-10-CM | POA: Diagnosis not present

## 2015-10-26 DIAGNOSIS — R532 Functional quadriplegia: Secondary | ICD-10-CM | POA: Diagnosis not present

## 2015-10-26 DIAGNOSIS — L89894 Pressure ulcer of other site, stage 4: Secondary | ICD-10-CM | POA: Diagnosis not present

## 2015-10-26 DIAGNOSIS — M25569 Pain in unspecified knee: Secondary | ICD-10-CM | POA: Diagnosis not present

## 2015-10-26 DIAGNOSIS — Z9181 History of falling: Secondary | ICD-10-CM | POA: Diagnosis not present

## 2015-10-27 DIAGNOSIS — Z9181 History of falling: Secondary | ICD-10-CM | POA: Diagnosis not present

## 2015-10-27 DIAGNOSIS — M25569 Pain in unspecified knee: Secondary | ICD-10-CM | POA: Diagnosis not present

## 2015-10-27 DIAGNOSIS — R532 Functional quadriplegia: Secondary | ICD-10-CM | POA: Diagnosis not present

## 2015-10-27 DIAGNOSIS — L89894 Pressure ulcer of other site, stage 4: Secondary | ICD-10-CM | POA: Diagnosis not present

## 2015-11-01 DIAGNOSIS — L89894 Pressure ulcer of other site, stage 4: Secondary | ICD-10-CM | POA: Diagnosis not present

## 2015-11-01 DIAGNOSIS — M25569 Pain in unspecified knee: Secondary | ICD-10-CM | POA: Diagnosis not present

## 2015-11-01 DIAGNOSIS — Z9181 History of falling: Secondary | ICD-10-CM | POA: Diagnosis not present

## 2015-11-01 DIAGNOSIS — R532 Functional quadriplegia: Secondary | ICD-10-CM | POA: Diagnosis not present

## 2015-11-03 DIAGNOSIS — R532 Functional quadriplegia: Secondary | ICD-10-CM | POA: Diagnosis not present

## 2015-11-03 DIAGNOSIS — Z9181 History of falling: Secondary | ICD-10-CM | POA: Diagnosis not present

## 2015-11-03 DIAGNOSIS — M25569 Pain in unspecified knee: Secondary | ICD-10-CM | POA: Diagnosis not present

## 2015-11-03 DIAGNOSIS — L89894 Pressure ulcer of other site, stage 4: Secondary | ICD-10-CM | POA: Diagnosis not present

## 2015-11-08 DIAGNOSIS — Z9181 History of falling: Secondary | ICD-10-CM | POA: Diagnosis not present

## 2015-11-08 DIAGNOSIS — R532 Functional quadriplegia: Secondary | ICD-10-CM | POA: Diagnosis not present

## 2015-11-08 DIAGNOSIS — L89894 Pressure ulcer of other site, stage 4: Secondary | ICD-10-CM | POA: Diagnosis not present

## 2015-11-08 DIAGNOSIS — M25569 Pain in unspecified knee: Secondary | ICD-10-CM | POA: Diagnosis not present

## 2015-11-10 DIAGNOSIS — Z9181 History of falling: Secondary | ICD-10-CM | POA: Diagnosis not present

## 2015-11-10 DIAGNOSIS — R532 Functional quadriplegia: Secondary | ICD-10-CM | POA: Diagnosis not present

## 2015-11-10 DIAGNOSIS — M25569 Pain in unspecified knee: Secondary | ICD-10-CM | POA: Diagnosis not present

## 2015-11-10 DIAGNOSIS — L89894 Pressure ulcer of other site, stage 4: Secondary | ICD-10-CM | POA: Diagnosis not present

## 2015-11-16 DIAGNOSIS — L89894 Pressure ulcer of other site, stage 4: Secondary | ICD-10-CM | POA: Diagnosis not present

## 2015-11-16 DIAGNOSIS — Z9181 History of falling: Secondary | ICD-10-CM | POA: Diagnosis not present

## 2015-11-16 DIAGNOSIS — M25569 Pain in unspecified knee: Secondary | ICD-10-CM | POA: Diagnosis not present

## 2015-11-16 DIAGNOSIS — R532 Functional quadriplegia: Secondary | ICD-10-CM | POA: Diagnosis not present

## 2015-11-19 DIAGNOSIS — S82209A Unspecified fracture of shaft of unspecified tibia, initial encounter for closed fracture: Secondary | ICD-10-CM | POA: Diagnosis not present

## 2015-11-19 DIAGNOSIS — L89004 Pressure ulcer of unspecified elbow, stage 4: Secondary | ICD-10-CM | POA: Diagnosis not present

## 2015-11-19 DIAGNOSIS — G8250 Quadriplegia, unspecified: Secondary | ICD-10-CM | POA: Diagnosis not present

## 2015-11-19 DIAGNOSIS — G8929 Other chronic pain: Secondary | ICD-10-CM | POA: Diagnosis not present

## 2015-11-22 DIAGNOSIS — Z9181 History of falling: Secondary | ICD-10-CM | POA: Diagnosis not present

## 2015-11-22 DIAGNOSIS — L89004 Pressure ulcer of unspecified elbow, stage 4: Secondary | ICD-10-CM | POA: Diagnosis not present

## 2015-11-22 DIAGNOSIS — R532 Functional quadriplegia: Secondary | ICD-10-CM | POA: Diagnosis not present

## 2015-11-22 DIAGNOSIS — G8929 Other chronic pain: Secondary | ICD-10-CM | POA: Diagnosis not present

## 2015-11-22 DIAGNOSIS — G8250 Quadriplegia, unspecified: Secondary | ICD-10-CM | POA: Diagnosis not present

## 2015-11-22 DIAGNOSIS — S82209A Unspecified fracture of shaft of unspecified tibia, initial encounter for closed fracture: Secondary | ICD-10-CM | POA: Diagnosis not present

## 2015-11-22 DIAGNOSIS — L89894 Pressure ulcer of other site, stage 4: Secondary | ICD-10-CM | POA: Diagnosis not present

## 2015-11-22 DIAGNOSIS — M25569 Pain in unspecified knee: Secondary | ICD-10-CM | POA: Diagnosis not present

## 2015-12-08 DIAGNOSIS — H01003 Unspecified blepharitis right eye, unspecified eyelid: Secondary | ICD-10-CM | POA: Diagnosis not present

## 2015-12-08 DIAGNOSIS — H04123 Dry eye syndrome of bilateral lacrimal glands: Secondary | ICD-10-CM | POA: Diagnosis not present

## 2015-12-08 DIAGNOSIS — H5711 Ocular pain, right eye: Secondary | ICD-10-CM | POA: Diagnosis not present

## 2015-12-08 DIAGNOSIS — H25013 Cortical age-related cataract, bilateral: Secondary | ICD-10-CM | POA: Diagnosis not present

## 2015-12-20 DIAGNOSIS — L89004 Pressure ulcer of unspecified elbow, stage 4: Secondary | ICD-10-CM | POA: Diagnosis not present

## 2015-12-20 DIAGNOSIS — G8250 Quadriplegia, unspecified: Secondary | ICD-10-CM | POA: Diagnosis not present

## 2015-12-20 DIAGNOSIS — S82209A Unspecified fracture of shaft of unspecified tibia, initial encounter for closed fracture: Secondary | ICD-10-CM | POA: Diagnosis not present

## 2015-12-20 DIAGNOSIS — G8929 Other chronic pain: Secondary | ICD-10-CM | POA: Diagnosis not present

## 2015-12-22 DIAGNOSIS — M24561 Contracture, right knee: Secondary | ICD-10-CM | POA: Diagnosis not present

## 2015-12-22 DIAGNOSIS — M24552 Contracture, left hip: Secondary | ICD-10-CM | POA: Diagnosis not present

## 2015-12-22 DIAGNOSIS — R532 Functional quadriplegia: Secondary | ICD-10-CM | POA: Diagnosis not present

## 2015-12-22 DIAGNOSIS — Z993 Dependence on wheelchair: Secondary | ICD-10-CM | POA: Diagnosis not present

## 2015-12-22 DIAGNOSIS — L89224 Pressure ulcer of left hip, stage 4: Secondary | ICD-10-CM | POA: Diagnosis not present

## 2015-12-22 DIAGNOSIS — M24562 Contracture, left knee: Secondary | ICD-10-CM | POA: Diagnosis not present

## 2015-12-22 DIAGNOSIS — M6281 Muscle weakness (generalized): Secondary | ICD-10-CM | POA: Diagnosis not present

## 2015-12-22 DIAGNOSIS — M24551 Contracture, right hip: Secondary | ICD-10-CM | POA: Diagnosis not present

## 2015-12-23 DIAGNOSIS — S82209A Unspecified fracture of shaft of unspecified tibia, initial encounter for closed fracture: Secondary | ICD-10-CM | POA: Diagnosis not present

## 2015-12-23 DIAGNOSIS — M24561 Contracture, right knee: Secondary | ICD-10-CM | POA: Diagnosis not present

## 2015-12-23 DIAGNOSIS — M6281 Muscle weakness (generalized): Secondary | ICD-10-CM | POA: Diagnosis not present

## 2015-12-23 DIAGNOSIS — M24551 Contracture, right hip: Secondary | ICD-10-CM | POA: Diagnosis not present

## 2015-12-23 DIAGNOSIS — R532 Functional quadriplegia: Secondary | ICD-10-CM | POA: Diagnosis not present

## 2015-12-23 DIAGNOSIS — G8929 Other chronic pain: Secondary | ICD-10-CM | POA: Diagnosis not present

## 2015-12-23 DIAGNOSIS — Z993 Dependence on wheelchair: Secondary | ICD-10-CM | POA: Diagnosis not present

## 2015-12-23 DIAGNOSIS — L89224 Pressure ulcer of left hip, stage 4: Secondary | ICD-10-CM | POA: Diagnosis not present

## 2015-12-23 DIAGNOSIS — M24552 Contracture, left hip: Secondary | ICD-10-CM | POA: Diagnosis not present

## 2015-12-23 DIAGNOSIS — L89004 Pressure ulcer of unspecified elbow, stage 4: Secondary | ICD-10-CM | POA: Diagnosis not present

## 2015-12-23 DIAGNOSIS — G8250 Quadriplegia, unspecified: Secondary | ICD-10-CM | POA: Diagnosis not present

## 2015-12-23 DIAGNOSIS — M24562 Contracture, left knee: Secondary | ICD-10-CM | POA: Diagnosis not present

## 2015-12-26 DIAGNOSIS — M24552 Contracture, left hip: Secondary | ICD-10-CM | POA: Diagnosis not present

## 2015-12-26 DIAGNOSIS — R532 Functional quadriplegia: Secondary | ICD-10-CM | POA: Diagnosis not present

## 2015-12-26 DIAGNOSIS — M6281 Muscle weakness (generalized): Secondary | ICD-10-CM | POA: Diagnosis not present

## 2015-12-26 DIAGNOSIS — M24551 Contracture, right hip: Secondary | ICD-10-CM | POA: Diagnosis not present

## 2015-12-26 DIAGNOSIS — M24562 Contracture, left knee: Secondary | ICD-10-CM | POA: Diagnosis not present

## 2015-12-26 DIAGNOSIS — L89224 Pressure ulcer of left hip, stage 4: Secondary | ICD-10-CM | POA: Diagnosis not present

## 2015-12-26 DIAGNOSIS — Z993 Dependence on wheelchair: Secondary | ICD-10-CM | POA: Diagnosis not present

## 2015-12-26 DIAGNOSIS — M24561 Contracture, right knee: Secondary | ICD-10-CM | POA: Diagnosis not present

## 2015-12-27 DIAGNOSIS — L89224 Pressure ulcer of left hip, stage 4: Secondary | ICD-10-CM | POA: Diagnosis not present

## 2015-12-27 DIAGNOSIS — M24551 Contracture, right hip: Secondary | ICD-10-CM | POA: Diagnosis not present

## 2015-12-27 DIAGNOSIS — M24562 Contracture, left knee: Secondary | ICD-10-CM | POA: Diagnosis not present

## 2015-12-27 DIAGNOSIS — Z993 Dependence on wheelchair: Secondary | ICD-10-CM | POA: Diagnosis not present

## 2015-12-27 DIAGNOSIS — R532 Functional quadriplegia: Secondary | ICD-10-CM | POA: Diagnosis not present

## 2015-12-27 DIAGNOSIS — M24552 Contracture, left hip: Secondary | ICD-10-CM | POA: Diagnosis not present

## 2015-12-27 DIAGNOSIS — M6281 Muscle weakness (generalized): Secondary | ICD-10-CM | POA: Diagnosis not present

## 2015-12-27 DIAGNOSIS — M24561 Contracture, right knee: Secondary | ICD-10-CM | POA: Diagnosis not present

## 2016-01-02 DIAGNOSIS — M24562 Contracture, left knee: Secondary | ICD-10-CM | POA: Diagnosis not present

## 2016-01-02 DIAGNOSIS — L89224 Pressure ulcer of left hip, stage 4: Secondary | ICD-10-CM | POA: Diagnosis not present

## 2016-01-02 DIAGNOSIS — M24552 Contracture, left hip: Secondary | ICD-10-CM | POA: Diagnosis not present

## 2016-01-02 DIAGNOSIS — Z993 Dependence on wheelchair: Secondary | ICD-10-CM | POA: Diagnosis not present

## 2016-01-02 DIAGNOSIS — M24561 Contracture, right knee: Secondary | ICD-10-CM | POA: Diagnosis not present

## 2016-01-02 DIAGNOSIS — M24551 Contracture, right hip: Secondary | ICD-10-CM | POA: Diagnosis not present

## 2016-01-02 DIAGNOSIS — R532 Functional quadriplegia: Secondary | ICD-10-CM | POA: Diagnosis not present

## 2016-01-02 DIAGNOSIS — M6281 Muscle weakness (generalized): Secondary | ICD-10-CM | POA: Diagnosis not present

## 2016-01-05 DIAGNOSIS — M24561 Contracture, right knee: Secondary | ICD-10-CM | POA: Diagnosis not present

## 2016-01-05 DIAGNOSIS — M24551 Contracture, right hip: Secondary | ICD-10-CM | POA: Diagnosis not present

## 2016-01-05 DIAGNOSIS — M24552 Contracture, left hip: Secondary | ICD-10-CM | POA: Diagnosis not present

## 2016-01-05 DIAGNOSIS — L89224 Pressure ulcer of left hip, stage 4: Secondary | ICD-10-CM | POA: Diagnosis not present

## 2016-01-05 DIAGNOSIS — R532 Functional quadriplegia: Secondary | ICD-10-CM | POA: Diagnosis not present

## 2016-01-05 DIAGNOSIS — Z993 Dependence on wheelchair: Secondary | ICD-10-CM | POA: Diagnosis not present

## 2016-01-05 DIAGNOSIS — M24562 Contracture, left knee: Secondary | ICD-10-CM | POA: Diagnosis not present

## 2016-01-05 DIAGNOSIS — M6281 Muscle weakness (generalized): Secondary | ICD-10-CM | POA: Diagnosis not present

## 2016-01-06 DIAGNOSIS — M24562 Contracture, left knee: Secondary | ICD-10-CM | POA: Diagnosis not present

## 2016-01-06 DIAGNOSIS — M24552 Contracture, left hip: Secondary | ICD-10-CM | POA: Diagnosis not present

## 2016-01-06 DIAGNOSIS — M6281 Muscle weakness (generalized): Secondary | ICD-10-CM | POA: Diagnosis not present

## 2016-01-06 DIAGNOSIS — R532 Functional quadriplegia: Secondary | ICD-10-CM | POA: Diagnosis not present

## 2016-01-06 DIAGNOSIS — L89224 Pressure ulcer of left hip, stage 4: Secondary | ICD-10-CM | POA: Diagnosis not present

## 2016-01-06 DIAGNOSIS — M24551 Contracture, right hip: Secondary | ICD-10-CM | POA: Diagnosis not present

## 2016-01-06 DIAGNOSIS — Z993 Dependence on wheelchair: Secondary | ICD-10-CM | POA: Diagnosis not present

## 2016-01-06 DIAGNOSIS — M24561 Contracture, right knee: Secondary | ICD-10-CM | POA: Diagnosis not present

## 2016-01-09 DIAGNOSIS — M24562 Contracture, left knee: Secondary | ICD-10-CM | POA: Diagnosis not present

## 2016-01-09 DIAGNOSIS — M6281 Muscle weakness (generalized): Secondary | ICD-10-CM | POA: Diagnosis not present

## 2016-01-09 DIAGNOSIS — M24561 Contracture, right knee: Secondary | ICD-10-CM | POA: Diagnosis not present

## 2016-01-09 DIAGNOSIS — Z993 Dependence on wheelchair: Secondary | ICD-10-CM | POA: Diagnosis not present

## 2016-01-09 DIAGNOSIS — L89224 Pressure ulcer of left hip, stage 4: Secondary | ICD-10-CM | POA: Diagnosis not present

## 2016-01-09 DIAGNOSIS — R532 Functional quadriplegia: Secondary | ICD-10-CM | POA: Diagnosis not present

## 2016-01-09 DIAGNOSIS — M24551 Contracture, right hip: Secondary | ICD-10-CM | POA: Diagnosis not present

## 2016-01-09 DIAGNOSIS — M24552 Contracture, left hip: Secondary | ICD-10-CM | POA: Diagnosis not present

## 2016-01-18 DIAGNOSIS — L89224 Pressure ulcer of left hip, stage 4: Secondary | ICD-10-CM | POA: Diagnosis not present

## 2016-01-18 DIAGNOSIS — R532 Functional quadriplegia: Secondary | ICD-10-CM | POA: Diagnosis not present

## 2016-01-18 DIAGNOSIS — Z993 Dependence on wheelchair: Secondary | ICD-10-CM | POA: Diagnosis not present

## 2016-01-18 DIAGNOSIS — M24561 Contracture, right knee: Secondary | ICD-10-CM | POA: Diagnosis not present

## 2016-01-18 DIAGNOSIS — M6281 Muscle weakness (generalized): Secondary | ICD-10-CM | POA: Diagnosis not present

## 2016-01-18 DIAGNOSIS — M24551 Contracture, right hip: Secondary | ICD-10-CM | POA: Diagnosis not present

## 2016-01-18 DIAGNOSIS — M24552 Contracture, left hip: Secondary | ICD-10-CM | POA: Diagnosis not present

## 2016-01-18 DIAGNOSIS — M24562 Contracture, left knee: Secondary | ICD-10-CM | POA: Diagnosis not present

## 2016-01-19 DIAGNOSIS — L89004 Pressure ulcer of unspecified elbow, stage 4: Secondary | ICD-10-CM | POA: Diagnosis not present

## 2016-01-19 DIAGNOSIS — G8250 Quadriplegia, unspecified: Secondary | ICD-10-CM | POA: Diagnosis not present

## 2016-01-19 DIAGNOSIS — G8929 Other chronic pain: Secondary | ICD-10-CM | POA: Diagnosis not present

## 2016-01-19 DIAGNOSIS — S82209A Unspecified fracture of shaft of unspecified tibia, initial encounter for closed fracture: Secondary | ICD-10-CM | POA: Diagnosis not present

## 2016-01-22 DIAGNOSIS — S82209A Unspecified fracture of shaft of unspecified tibia, initial encounter for closed fracture: Secondary | ICD-10-CM | POA: Diagnosis not present

## 2016-01-22 DIAGNOSIS — L89004 Pressure ulcer of unspecified elbow, stage 4: Secondary | ICD-10-CM | POA: Diagnosis not present

## 2016-01-22 DIAGNOSIS — G8250 Quadriplegia, unspecified: Secondary | ICD-10-CM | POA: Diagnosis not present

## 2016-01-22 DIAGNOSIS — G8929 Other chronic pain: Secondary | ICD-10-CM | POA: Diagnosis not present

## 2016-01-27 DIAGNOSIS — R532 Functional quadriplegia: Secondary | ICD-10-CM | POA: Diagnosis not present

## 2016-04-02 DIAGNOSIS — Z9181 History of falling: Secondary | ICD-10-CM | POA: Diagnosis not present

## 2016-04-02 DIAGNOSIS — M24561 Contracture, right knee: Secondary | ICD-10-CM | POA: Diagnosis not present

## 2016-04-02 DIAGNOSIS — M24552 Contracture, left hip: Secondary | ICD-10-CM | POA: Diagnosis not present

## 2016-04-02 DIAGNOSIS — M24551 Contracture, right hip: Secondary | ICD-10-CM | POA: Diagnosis not present

## 2016-04-02 DIAGNOSIS — R532 Functional quadriplegia: Secondary | ICD-10-CM | POA: Diagnosis not present

## 2016-04-02 DIAGNOSIS — M1990 Unspecified osteoarthritis, unspecified site: Secondary | ICD-10-CM | POA: Diagnosis not present

## 2016-04-02 DIAGNOSIS — M24562 Contracture, left knee: Secondary | ICD-10-CM | POA: Diagnosis not present

## 2016-04-06 DIAGNOSIS — M1990 Unspecified osteoarthritis, unspecified site: Secondary | ICD-10-CM | POA: Diagnosis not present

## 2016-04-06 DIAGNOSIS — M24551 Contracture, right hip: Secondary | ICD-10-CM | POA: Diagnosis not present

## 2016-04-06 DIAGNOSIS — R532 Functional quadriplegia: Secondary | ICD-10-CM | POA: Diagnosis not present

## 2016-04-06 DIAGNOSIS — M24561 Contracture, right knee: Secondary | ICD-10-CM | POA: Diagnosis not present

## 2016-04-06 DIAGNOSIS — M24552 Contracture, left hip: Secondary | ICD-10-CM | POA: Diagnosis not present

## 2016-04-06 DIAGNOSIS — Z9181 History of falling: Secondary | ICD-10-CM | POA: Diagnosis not present

## 2016-04-06 DIAGNOSIS — M24562 Contracture, left knee: Secondary | ICD-10-CM | POA: Diagnosis not present

## 2016-05-12 DIAGNOSIS — Z801 Family history of malignant neoplasm of trachea, bronchus and lung: Secondary | ICD-10-CM | POA: Diagnosis not present

## 2016-05-12 DIAGNOSIS — Z9842 Cataract extraction status, left eye: Secondary | ICD-10-CM | POA: Diagnosis not present

## 2016-05-12 DIAGNOSIS — H547 Unspecified visual loss: Secondary | ICD-10-CM | POA: Diagnosis not present

## 2016-05-12 DIAGNOSIS — Z9841 Cataract extraction status, right eye: Secondary | ICD-10-CM | POA: Diagnosis not present

## 2016-05-12 DIAGNOSIS — M1990 Unspecified osteoarthritis, unspecified site: Secondary | ICD-10-CM | POA: Diagnosis not present

## 2016-05-12 DIAGNOSIS — Z6824 Body mass index (BMI) 24.0-24.9, adult: Secondary | ICD-10-CM | POA: Diagnosis not present

## 2016-05-12 DIAGNOSIS — Z836 Family history of other diseases of the respiratory system: Secondary | ICD-10-CM | POA: Diagnosis not present

## 2016-06-01 DIAGNOSIS — R532 Functional quadriplegia: Secondary | ICD-10-CM | POA: Diagnosis not present

## 2016-06-14 ENCOUNTER — Encounter: Payer: Self-pay | Admitting: *Deleted

## 2016-06-21 ENCOUNTER — Other Ambulatory Visit: Payer: Self-pay | Admitting: *Deleted

## 2016-06-21 NOTE — Patient Outreach (Signed)
[facility]) Care Management  06/21/2016  April Graves 03-20-1955 [id number]  CSW was able to make initial contact with patient today to perform phone assessment, as well as assess and assist with social work needs and services.  CSW introduced Graves, explained role and types of services provided through [facility]).  CSW further explained to patient that CSW works with patient's [employer] Case Worker, April Graves. CSW then explained the reason for the call, indicating that April Graves thought that patient would benefit from social work services and resources to assist patient with obtaining a power wheelchair.  CSW obtained two HIPAA compliant identifiers from patient, which included patient's name and date of birth. Patient admits that she is already working with a representative by the name of April Graves to purchase a power wheelchair.  Patient went on to say that April Graves has already obtained all her financial information and has submitted it to [facility] for processing and prior approval. Patient has already picked out the wheelchair of her choice and is waiting to be fitted by a physical therapist.  Patient is aware that an order will need to be obtained from patient's primary care physician, which patient does not have assigned at present.  Patient will need to chose a primary care physician from the list provided and schedule an initial visit.  Patient did not wish for CSW to get involved with the process of her trying to get her power wheel chair, for fear of CSW duplicating services.  CSW voiced understanding and agreed to close patient's case. CSW will perform a case closure on patient, as all goals of treatment have been met from social work standpoint and no additional social work needs have been identified at this time.  CSW will submit a case closure request to April Graves, Care Management Assistant with [facility], in the form of an [employer].  CSW will inform April Graves of CSW's plans to perform a case closure on patient.  Nat Christen, BSW, MSW, LCSW  Licensed Education officer, [facility]  [address] [place] [address] [place], [place] [address] Physical Address-[address], [place], [place] [address] Toll Free Main # [phone] Fax # [phone] Cell # [phone]  Office # [phone] [email or web address]

## 2016-07-17 ENCOUNTER — Ambulatory Visit: Payer: Medicare HMO | Admitting: Physical Therapy

## 2016-07-31 ENCOUNTER — Ambulatory Visit: Payer: Medicare HMO | Admitting: Physical Therapy

## 2016-08-07 DIAGNOSIS — Z993 Dependence on wheelchair: Secondary | ICD-10-CM | POA: Diagnosis not present

## 2016-08-07 DIAGNOSIS — R532 Functional quadriplegia: Secondary | ICD-10-CM | POA: Diagnosis not present

## 2016-08-07 DIAGNOSIS — W109XXS Fall (on) (from) unspecified stairs and steps, sequela: Secondary | ICD-10-CM | POA: Diagnosis not present

## 2016-08-07 DIAGNOSIS — I10 Essential (primary) hypertension: Secondary | ICD-10-CM | POA: Diagnosis not present

## 2016-08-17 DIAGNOSIS — R532 Functional quadriplegia: Secondary | ICD-10-CM | POA: Diagnosis not present

## 2016-08-17 DIAGNOSIS — W109XXS Fall (on) (from) unspecified stairs and steps, sequela: Secondary | ICD-10-CM | POA: Diagnosis not present

## 2016-08-17 DIAGNOSIS — Z993 Dependence on wheelchair: Secondary | ICD-10-CM | POA: Diagnosis not present

## 2016-08-17 DIAGNOSIS — I10 Essential (primary) hypertension: Secondary | ICD-10-CM | POA: Diagnosis not present

## 2017-02-14 DIAGNOSIS — M2550 Pain in unspecified joint: Secondary | ICD-10-CM | POA: Diagnosis not present

## 2017-02-14 DIAGNOSIS — M545 Low back pain: Secondary | ICD-10-CM | POA: Diagnosis not present

## 2017-02-14 DIAGNOSIS — F39 Unspecified mood [affective] disorder: Secondary | ICD-10-CM | POA: Diagnosis not present

## 2017-02-14 DIAGNOSIS — Z6824 Body mass index (BMI) 24.0-24.9, adult: Secondary | ICD-10-CM | POA: Diagnosis not present

## 2017-03-01 ENCOUNTER — Inpatient Hospital Stay (HOSPITAL_COMMUNITY)
Admission: EM | Admit: 2017-03-01 | Discharge: 2017-03-04 | DRG: 175 | Disposition: A | Discharge status: Home Health | Payer: Medicare HMO | Source: Ambulatory Visit | Attending: Family Medicine | Admitting: Family Medicine

## 2017-03-01 ENCOUNTER — Emergency Department (HOSPITAL_COMMUNITY): Payer: Medicare HMO

## 2017-03-01 ENCOUNTER — Encounter (HOSPITAL_COMMUNITY): Payer: Self-pay | Admitting: Emergency Medicine

## 2017-03-01 DIAGNOSIS — R532 Functional quadriplegia: Secondary | ICD-10-CM | POA: Diagnosis not present

## 2017-03-01 DIAGNOSIS — R9431 Abnormal electrocardiogram [ECG] [EKG]: Secondary | ICD-10-CM | POA: Diagnosis present

## 2017-03-01 DIAGNOSIS — M25562 Pain in left knee: Secondary | ICD-10-CM | POA: Diagnosis not present

## 2017-03-01 DIAGNOSIS — E871 Hypo-osmolality and hyponatremia: Secondary | ICD-10-CM | POA: Diagnosis not present

## 2017-03-01 DIAGNOSIS — M25561 Pain in right knee: Secondary | ICD-10-CM | POA: Diagnosis present

## 2017-03-01 DIAGNOSIS — Z888 Allergy status to other drugs, medicaments and biological substances status: Secondary | ICD-10-CM

## 2017-03-01 DIAGNOSIS — Z885 Allergy status to narcotic agent status: Secondary | ICD-10-CM

## 2017-03-01 DIAGNOSIS — R079 Chest pain, unspecified: Secondary | ICD-10-CM | POA: Diagnosis not present

## 2017-03-01 DIAGNOSIS — I2699 Other pulmonary embolism without acute cor pulmonale: Secondary | ICD-10-CM

## 2017-03-01 DIAGNOSIS — M19272 Secondary osteoarthritis, left ankle and foot: Secondary | ICD-10-CM | POA: Diagnosis present

## 2017-03-01 DIAGNOSIS — M19012 Primary osteoarthritis, left shoulder: Secondary | ICD-10-CM | POA: Diagnosis present

## 2017-03-01 DIAGNOSIS — M19011 Primary osteoarthritis, right shoulder: Secondary | ICD-10-CM | POA: Diagnosis present

## 2017-03-01 DIAGNOSIS — G894 Chronic pain syndrome: Secondary | ICD-10-CM | POA: Diagnosis present

## 2017-03-01 DIAGNOSIS — B351 Tinea unguium: Secondary | ICD-10-CM | POA: Diagnosis not present

## 2017-03-01 DIAGNOSIS — Z79899 Other long term (current) drug therapy: Secondary | ICD-10-CM

## 2017-03-01 DIAGNOSIS — M1990 Unspecified osteoarthritis, unspecified site: Secondary | ICD-10-CM | POA: Diagnosis present

## 2017-03-01 DIAGNOSIS — R030 Elevated blood-pressure reading, without diagnosis of hypertension: Secondary | ICD-10-CM | POA: Diagnosis present

## 2017-03-01 DIAGNOSIS — D638 Anemia in other chronic diseases classified elsewhere: Secondary | ICD-10-CM | POA: Diagnosis present

## 2017-03-01 DIAGNOSIS — E876 Hypokalemia: Secondary | ICD-10-CM | POA: Diagnosis present

## 2017-03-01 DIAGNOSIS — J80 Acute respiratory distress syndrome: Secondary | ICD-10-CM | POA: Diagnosis not present

## 2017-03-01 DIAGNOSIS — Z23 Encounter for immunization: Secondary | ICD-10-CM

## 2017-03-01 DIAGNOSIS — R0789 Other chest pain: Secondary | ICD-10-CM | POA: Diagnosis not present

## 2017-03-01 DIAGNOSIS — G8250 Quadriplegia, unspecified: Secondary | ICD-10-CM | POA: Diagnosis not present

## 2017-03-01 DIAGNOSIS — I5030 Unspecified diastolic (congestive) heart failure: Secondary | ICD-10-CM | POA: Diagnosis not present

## 2017-03-01 DIAGNOSIS — R609 Edema, unspecified: Secondary | ICD-10-CM | POA: Diagnosis not present

## 2017-03-01 DIAGNOSIS — R52 Pain, unspecified: Secondary | ICD-10-CM | POA: Diagnosis not present

## 2017-03-01 DIAGNOSIS — R000 Tachycardia, unspecified: Secondary | ICD-10-CM

## 2017-03-01 LAB — CBC WITH DIFFERENTIAL/PLATELET
BASOS ABS: 0 10*3/uL (ref 0.0–0.1)
Basophils Relative: 0 %
EOS PCT: 1 %
Eosinophils Absolute: 0.1 10*3/uL (ref 0.0–0.7)
HEMATOCRIT: 39.4 % (ref 36.0–46.0)
Hemoglobin: 13.2 g/dL (ref 12.0–15.0)
LYMPHS ABS: 1.3 10*3/uL (ref 0.7–4.0)
LYMPHS PCT: 18 %
MCH: 28.3 pg (ref 26.0–34.0)
MCHC: 33.5 g/dL (ref 30.0–36.0)
MCV: 84.5 fL (ref 78.0–100.0)
MONO ABS: 0.7 10*3/uL (ref 0.1–1.0)
Monocytes Relative: 9 %
NEUTROS ABS: 5.1 10*3/uL (ref 1.7–7.7)
Neutrophils Relative %: 72 %
PLATELETS: 176 10*3/uL (ref 150–400)
RBC: 4.66 MIL/uL (ref 3.87–5.11)
RDW: 14.4 % (ref 11.5–15.5)
WBC: 7.1 10*3/uL (ref 4.0–10.5)

## 2017-03-01 LAB — COMPREHENSIVE METABOLIC PANEL
ALT: 18 U/L (ref 14–54)
AST: 28 U/L (ref 15–41)
Albumin: 3.2 g/dL — ABNORMAL LOW (ref 3.5–5.0)
Alkaline Phosphatase: 231 U/L — ABNORMAL HIGH (ref 38–126)
Anion gap: 9 (ref 5–15)
BILIRUBIN TOTAL: 0.8 mg/dL (ref 0.3–1.2)
BUN: 11 mg/dL (ref 6–20)
CHLORIDE: 104 mmol/L (ref 101–111)
CO2: 21 mmol/L — ABNORMAL LOW (ref 22–32)
CREATININE: 0.46 mg/dL (ref 0.44–1.00)
Calcium: 9.9 mg/dL (ref 8.9–10.3)
GFR calc non Af Amer: >60 mL/min (ref >60)
Glucose, Bld: 116 mg/dL — ABNORMAL HIGH (ref 65–99)
Potassium: 4.1 mmol/L (ref 3.5–5.1)
Sodium: 134 mmol/L — ABNORMAL LOW (ref 135–145)
TOTAL PROTEIN: 8.5 g/dL — AB (ref 6.5–8.1)

## 2017-03-01 LAB — D-DIMER, QUANTITATIVE (NOT AT ARMC)

## 2017-03-01 LAB — TROPONIN I: Troponin I: <0.03 ng/mL (ref <0.03)

## 2017-03-01 MED ORDER — IOPAMIDOL (ISOVUE-370) INJECTION 76%
INTRAVENOUS | Status: AC
  Administered 2017-03-01: 100 mL
  Filled 2017-03-01: qty 100

## 2017-03-01 MED ORDER — HYDROMORPHONE HCL 1 MG/ML IJ SOLN
1.0000 mg | Freq: Once | INTRAMUSCULAR | Status: AC
  Administered 2017-03-01: 1 mg via INTRAVENOUS
  Filled 2017-03-01: qty 1

## 2017-03-01 MED ORDER — HEPARIN (PORCINE) IN NACL 100-0.45 UNIT/ML-% IJ SOLN
1100.0000 [IU]/h | INTRAMUSCULAR | Status: AC
  Administered 2017-03-02 (×2): 1100 [IU]/h via INTRAVENOUS
  Filled 2017-03-01 (×2): qty 250

## 2017-03-01 MED ORDER — HEPARIN BOLUS VIA INFUSION
4000.0000 [IU] | Freq: Once | INTRAVENOUS | Status: AC
  Administered 2017-03-01: 4000 [IU] via INTRAVENOUS
  Filled 2017-03-01: qty 4000

## 2017-03-01 MED ORDER — SODIUM CHLORIDE 0.9 % IV BOLUS (SEPSIS)
1000.0000 mL | Freq: Once | INTRAVENOUS | Status: AC
  Administered 2017-03-02: 1000 mL via INTRAVENOUS

## 2017-03-01 MED ORDER — KETOROLAC TROMETHAMINE 30 MG/ML IJ SOLN
30.0000 mg | Freq: Once | INTRAMUSCULAR | Status: AC
Start: 2017-03-01 — End: 2017-03-02
  Administered 2017-03-02: 30 mg via INTRAVENOUS
  Filled 2017-03-01: qty 1

## 2017-03-01 MED ORDER — SODIUM CHLORIDE 0.9 % IV BOLUS (SEPSIS)
1000.0000 mL | Freq: Once | INTRAVENOUS | Status: AC
  Administered 2017-03-01: 1000 mL via INTRAVENOUS

## 2017-03-01 MED ORDER — OXYCODONE-ACETAMINOPHEN 5-325 MG PO TABS
2.0000 | ORAL_TABLET | Freq: Once | ORAL | Status: AC
  Administered 2017-03-02: 2 via ORAL
  Filled 2017-03-01: qty 2

## 2017-03-01 NOTE — H&P (Signed)
TRH H&P   Patient Demographics:    April Graves, is a 61 y.o. female  MRN: [id number]   DOB - 1955/07/13  Admit Date - 03/01/2017  Outpatient Primary MD for the patient is Patient, No Pcp Per  Referring MD/NP/PA:   Merrily Pew  Outpatient Specialists:     Patient coming from: home  Chief Complaint  Patient presents with  . Leg Pain  . Abnormal ECG      HPI:    April Graves  is a 62 y.o. female, w h/o functional quadriplegia,  anemia, osteoarthritis, apparently presented her pcp office and was noted to be tachycardic and sent to ED for evaluation.     In ED,  Pt was thought to have swollen leg and D dimer was positive and therefore CTA ordered and pt was found to have PE>   Trop <0.03 D dimer >20.00 Wbc 7.1 hgb 13.2 Plt 176 Na 134, K 4.1 Bun 11, creatinine 0.46 Ast 28, Alt 18 Alk phos 231 T. Bili 0.8  CTA chest IMPRESSION: Positive for acute PE without CTevidence of right heart strain (RV/LV Ratio = 0.61). Partially occlusive thromboemboli to the left lower lobe are noted.   left Tib/fib FINDINGS: Only a single view is submitted. Technologist reports unable to move patient to position for AP view. Old fracture deformity at the proximal fibula. Diffuse osteopenia. Possible remote fracture deformity of the posterior malleolar region of the distal tibia.  IMPRESSION: Limited study secondary to osteopenia positioning. No definite acute osseous abnormality. Suspected old fracture deformities of the proximal fibula and distal tibia.  Right Tib /fib FINDINGS: Only a single lateral views submitted. Technologist reports that patient was unable to be positioned for the AP view. Diffuse osteopenia limits the exam. No gross acute fracture deformity is seen. There are vascular calcifications.  IMPRESSION: Limited study secondary to positioning and  osteopenia. No gross acute osseous abnormality  Pt will be admitted for PE.  Consider orthopedic consultation for c/o leg pain    Review of systems:    In addition to the HPI above, Slight chest discomfort for several days.   No Fever-chills, No Headache, No changes with Vision or hearing, No problems swallowing food or Liquids, No Cough or SOB No Abdominal pain, No Nausea or Vommitting, Bowel movements are regular, No Blood in stool or Urine, No dysuria, No new skin rashes or bruises, No new joints pains-aches,  No new weakness, tingling, numbness in any extremity, No recent weight gain or loss, No polyuria, polydypsia or polyphagia, No significant Mental Stressors.  A full 10 point Review of Systems was done, except as stated above, all other Review of Systems were negative.   With Past History of the following :    Past Medical History:  Diagnosis Date  . Anemia of chronic disease   . Chronic pain syndrome   . Functional quadriplegia (HCC)  noted in d/c summary 03/2015  . History of decubitus ulcer    infected, left glut/hip  . History of malnutrition   . Osteoarthritis of both shoulders   . Poor dentition   . Secondary osteoarthritis of left ankle    Post-traumatic; at the tibiotalar joint.  + severe disuse osteopenia.      Past Surgical History:  Procedure Laterality Date  . KNEE CLOSED REDUCTION  10/16/2011   No fracture.  Procedure: CLOSED MANIPULATION KNEE;  Surgeon: Mauri Pole, MD;  Location: [facility];  Service: Orthopedics;;  Proxmial Fibula  . TUBAL LIGATION        Social History:     Social History   Tobacco Use  . Smoking status: Never Smoker  . Smokeless tobacco: Never Used  Substance Use Topics  . Alcohol use: No     [place] - [facility]  Mobility - functional quadriplegia   Family History :     Family History  Problem Relation Age of Onset  . Hypertension Unknown       Home Medications:   Prior to Admission medications     Medication Sig Start Date End Date Taking? Authorizing Provider  acetaminophen (TYLENOL) 325 MG tablet Take 2 tablets (650 mg total) by mouth every 6 (six) hours as needed for mild pain (or Fever >/= 101). Patient taking differently: Take 1,000 mg by mouth every 6 (six) hours as needed for mild pain (or Fever >/= 101).  03/13/15  Yes Robbie Lis, MD  albuterol (PROVENTIL HFA;VENTOLIN HFA) 108 (90 Base) MCG/ACT inhaler Inhale 1-2 puffs into the lungs every 6 (six) hours as needed for wheezing or shortness of breath.   Yes [provider]  cyclobenzaprine (FLEXERIL) 10 MG tablet Take 5 mg by mouth as needed. 08/17/14  Yes [provider]  meloxicam (MOBIC) 15 MG tablet Take 15 mg by mouth 2 (two) times daily as needed for pain.   Yes [provider]  Multiple Vitamin (MULTIVITAMIN WITH MINERALS) TABS tablet Take 1 tablet by mouth daily.   Yes [provider]  Amino Acids-Protein Hydrolys (FEEDING SUPPLEMENT, PRO-STAT SUGAR FREE 64,) LIQD Take 30 mLs by mouth 2 (two) times daily. Patient not taking: Reported on 03/01/2017 03/13/15   Robbie Lis, MD  amoxicillin-clavulanate (AUGMENTIN) 875-125 MG tablet Take 1 tablet by mouth 2 (two) times daily. Patient not taking: Reported on 03/01/2017 03/13/15   Robbie Lis, MD  doxycycline (VIBRA-TABS) 100 MG tablet Take 1 tablet (100 mg total) by mouth 2 (two) times daily. Patient not taking: Reported on 03/01/2017 03/13/15   Robbie Lis, MD  feeding supplement, ENSURE ENLIVE, (ENSURE ENLIVE) LIQD Take 237 mLs by mouth 2 (two) times daily between meals. Patient not taking: Reported on 03/01/2017 03/13/15   Robbie Lis, MD  hydrocerin (EUCERIN) CREA Apply 1 application topically 2 (two) times daily. Patient not taking: Reported on 03/01/2017 03/13/15   Robbie Lis, MD  ondansetron (ZOFRAN) 4 MG tablet Take 1 tablet (4 mg total) by mouth every 6 (six) hours as needed for nausea. Patient not taking: Reported on 03/01/2017 03/13/15    Robbie Lis, MD  oxyCODONE-acetaminophen (PERCOCET) 5-325 MG tablet Take 1 tablet by mouth every 4 (four) hours as needed for severe pain. Patient not taking: Reported on 03/01/2017 03/13/15   Robbie Lis, MD     Allergies:     Allergies  Allergen Reactions  . Codeine Anxiety and Other (See Comments)  . Lisinopril  Anxiety and Other (See Comments)    Heart Races      Physical Exam:   Vitals  Blood pressure 104/73, pulse (!) 114, temperature 98.2 F (36.8 C), temperature source Oral, resp. rate 16, height 5' 4"  (1.626 m), weight 63.5 kg (140 lb), SpO2 99 %.   1. General  lying in bed in NAD,    2. Normal affect and insight, Not Suicidal or Homicidal, Awake Alert, Oriented X 3.  3. No F.N deficits, ALL C.Nerves Intact, Strength 5/5 all 4 extremities, Sensation intact all 4 extremities, Plantars down going.  4. Ears and Eyes appear Normal, Conjunctivae clear, PERRLA. Moist Oral Mucosa.  5. Supple Neck, No JVD, No cervical lymphadenopathy appriciated, No Carotid Bruits.  6. Symmetrical Chest wall movement, Good air movement bilaterally, CTAB.  7. RRR, No Gallops, Rubs or Murmurs, No Parasternal Heave.  8. Positive Bowel Sounds, Abdomen Soft, No tenderness, No organomegaly appriciated,No rebound -guarding or rigidity.  9.  No Cyanosis, Normal Skin Turgor, No Skin Rash or Bruise.  10. Good muscle tone,  joints appear normal , no effusions, Normal ROM.  11. No Palpable Lymph Nodes in Neck or Axillae   ? Slight contracture of the bilateral legs at knees   Data Review:    CBC Recent Labs  Lab 03/01/17 1940  WBC 7.1  HGB 13.2  HCT 39.4  PLT 176  MCV 84.5  MCH 28.3  MCHC 33.5  RDW 14.4  LYMPHSABS 1.3  MONOABS 0.7  EOSABS 0.1  BASOSABS 0.0   ------------------------------------------------------------------------------------------------------------------  Chemistries  Recent Labs  Lab 03/01/17 1940  NA 134*  K 4.1  CL 104  CO2 21*  GLUCOSE 116*   BUN 11  CREATININE 0.46  CALCIUM 9.9  AST 28  ALT 18  ALKPHOS 231*  BILITOT 0.8   ------------------------------------------------------------------------------------------------------------------ estimated creatinine clearance is 64.6 mL/min (by C-G formula based on SCr of 0.46 mg/dL). ------------------------------------------------------------------------------------------------------------------ No results for input(s): TSH, T4TOTAL, T3FREE, THYROIDAB in the last 72 hours.  Invalid input(s): FREET3  Coagulation profile No results for input(s): INR, PROTIME in the last 168 hours. ------------------------------------------------------------------------------------------------------------------- Recent Labs    03/01/17 1940  DDIMER >20.00*   -------------------------------------------------------------------------------------------------------------------  Cardiac Enzymes Recent Labs  Lab 03/01/17 1940  TROPONINI <0.03   ------------------------------------------------------------------------------------------------------------------ No results found for: BNP   ---------------------------------------------------------------------------------------------------------------  Urinalysis    Component Value Date/Time   COLORURINE AMBER (A) 03/07/2015 1226   APPEARANCEUR CLEAR 03/07/2015 1226   LABSPEC 1.027 03/07/2015 1226   PHURINE 6.0 03/07/2015 1226   GLUCOSEU NEGATIVE 03/07/2015 1226   HGBUR NEGATIVE 03/07/2015 1226   BILIRUBINUR SMALL (A) 03/07/2015 1226   KETONESUR NEGATIVE 03/07/2015 1226   PROTEINUR 30 (A) 03/07/2015 1226   UROBILINOGEN 1.0 10/22/2011 0845   NITRITE NEGATIVE 03/07/2015 1226   LEUKOCYTESUR NEGATIVE 03/07/2015 1226    ----------------------------------------------------------------------------------------------------------------   Imaging Results:    Dg Chest 2 View  Result Date: 03/01/2017 CLINICAL DATA:  Tachycardia EXAM: CHEST  2 VIEW  COMPARISON:  03/07/2015 FINDINGS: AP and lateral views of the chest show no focal airspace consolidation, edema, or pleural effusion The cardiopericardial silhouette is within normal limits for size. Bones are diffusely demineralized. Chronic deformity of the humeral heads bilaterally. Telemetry leads overlie the chest. IMPRESSION: No active cardiopulmonary disease. Electronically Signed   By: Misty Stanley M.D.   On: 03/01/2017 20:20   Dg Tibia/fibula Left  Result Date: 03/01/2017 CLINICAL DATA:  Question fracture, history of quadriplegia EXAM: LEFT TIBIA AND FIBULA - 2 VIEW COMPARISON:  08/04/2014 FINDINGS: Only a single view is submitted. Technologist reports unable to move patient to position for AP view. Old fracture deformity at the proximal fibula. Diffuse osteopenia. Possible remote fracture deformity of the posterior malleolar region of the distal tibia. IMPRESSION: Limited study secondary to osteopenia positioning. No definite acute osseous abnormality. Suspected old fracture deformities of the proximal fibula and distal tibia. Electronically Signed   By: Donavan Foil M.D.   On: 03/01/2017 22:27   Dg Tibia/fibula Right  Result Date: 03/01/2017 CLINICAL DATA:  Question fracture history of quadriplegia EXAM: RIGHT TIBIA AND FIBULA - 2 VIEW COMPARISON:  CT 10/15/2011 FINDINGS: Only a single lateral views submitted. Technologist reports that patient was unable to be positioned for the AP view. Diffuse osteopenia limits the exam. No gross acute fracture deformity is seen. There are vascular calcifications. IMPRESSION: Limited study secondary to positioning and osteopenia. No gross acute osseous abnormality Electronically Signed   By: Donavan Foil M.D.   On: 03/01/2017 22:29   Ct Angio Chest Pe W And/or Wo Contrast  Result Date: 03/01/2017 CLINICAL DATA:  Intermittent upper chest pain for several months. EXAM: CT ANGIOGRAPHY CHEST WITH CONTRAST TECHNIQUE: Multidetector CT imaging of the chest was  performed using the standard protocol during bolus administration of intravenous contrast. Multiplanar CT image reconstructions and MIPs were obtained to evaluate the vascular anatomy. CONTRAST:  70 cc ISOVUE-370 IOPAMIDOL (ISOVUE-370) INJECTION 76% COMPARISON:  03/18/2012 chest CT FINDINGS: Cardiovascular: Normal branch pattern of the great vessels. No aortic aneurysm or dissection. Good opacification of the pulmonary arterial system. Nonocclusive thrombo embolus noted within the lobar, segmental and subsegmental pulmonary arteries to the left lower lobe. No right heart strain. RV/LV ratio 0.61. Mediastinum/Nodes: No enlarged mediastinal, hilar, or axillary lymph nodes. Fat containing axillary lymph nodes bilaterally likely reactive. The thyroid gland, trachea, and esophagus demonstrate no significant findings. Lungs/Pleura: Bronchiectasis to both lower lobes with atelectasis. Mild pleural thickening along the right major fissure. No pneumothorax or effusion. Upper Abdomen: No acute abnormality. Musculoskeletal: No chest wall abnormality. No acute nor suspicious osseous abnormalities. Remodeled appearance of the glenohumeral joints with erosive change bilaterally as before. Review of the MIP images confirms the above findings. IMPRESSION: Positive for acute PE without CTevidence of right heart strain (RV/LV Ratio = 0.61). Partially occlusive thromboemboli to the left lower lobe are noted. No active pulmonary disease.  Bibasilar atelectasis is noted. These results were called by telephone at the time of interpretation on 03/01/2017 at 10:50 pm to Dr. Merrily Pew , who verbally acknowledged these results. Electronically Signed   By: Ashley Royalty M.D.   On: 03/01/2017 22:50    ST at 100, nl axis, no st-t changes c/w ischemia, no s1, q3 inverted T in 3.    Assessment & Plan:    Principal Problem:   Pulmonary embolism (HCC) Active Problems:   Tachycardia   Hyponatremia    PE Heparin iv pharmacy to  dose  CP (none currently) Tele Trop I q6h x3 Check cardiac echo  Hyponatremia Ns iv  Check cmp in am  Abnormal liver function Check cmp in am Check acute hepatitis panel Check GGT Further w/up depending upon results of GGT  Knee/ leg pain Please consult orthopedics in AM regarding knee/ leg pain  DVT Prophylaxis Heparin -  - SCDs   AM Labs Ordered, also please review Full Orders  Family Communication: Admission, patients condition and plan of care including tests being ordered have been discussed with the patient  who indicate  understanding and agree with the plan and Code Status.  Code Status FULL CODE  Likely DC to  home  Condition GUARDED    Consults called: none, please call orthopedics in AM regarding knee and leg pain  Admission status: inpatient  Time spent in minutes : 45   Jani Gravel M.D on 03/01/2017 at 11:24 PM  Between 7am to 7pm - Pager - [phone]   After 7pm go to [email or web address] - password [email or web address]  [facility] - Office  [phone]

## 2017-03-01 NOTE — Progress Notes (Addendum)
ANTICOAGULATION CONSULT NOTE - Initial Consult  Pharmacy Consult for heparin Indication: pulmonary embolus  Allergies  Allergen Reactions  . Codeine Anxiety and Other (See Comments)  . Lisinopril Anxiety and Other (See Comments)    Heart Races     Patient Measurements: Height: 5\' 4"  (162.6 cm) Weight: 140 lb (63.5 kg) IBW/kg (Calculated) : 54.7  Vital Signs: Temp: 98.2 F (36.8 C) (01/25 1738) Temp Source: Oral (01/25 1738) BP: 118/68 (01/25 2200) Pulse Rate: 114 (01/25 1800)  Labs: Recent Labs    03/01/17 1940  HGB 13.2  HCT 39.4  PLT 176  CREATININE 0.46  TROPONINI <0.03    Estimated Creatinine Clearance: 64.6 mL/min (by C-G formula based on SCr of 0.46 mg/dL).   Medical History: Past Medical History:  Diagnosis Date  . Anemia of chronic disease   . Chronic pain syndrome   . Functional quadriplegia ([place])    noted in d/c summary 03/2015  . History of decubitus ulcer    infected, left glut/hip  . History of malnutrition   . Osteoarthritis of both shoulders   . Poor dentition   . Secondary osteoarthritis of left ankle    Post-traumatic; at the tibiotalar joint.  + severe disuse osteopenia.    Assessment: 62yo female sent to ED by PCP for tachycardia, D-dimer found to be >20, CT reveals partially occlusive thromboemboli to LLL, no CT evidence of RHS, to begin heparin.  Goal of Therapy:  Heparin level 0.3-0.7 units/ml Monitor platelets by anticoagulation protocol: Yes   Plan:  Will give 4000 units IV bolus x1 followed by gtt at 1100 units/hr and monitor heparin levels and CBC.  Wynona Neat, PharmD, BCPS  03/01/2017,10:56 PM   ADDENDUM: Initial heparin level at goal (0.53).  Will continue heparin gtt at current rate and confirm stable with additional level. VB   03/02/2017 7:21 AM

## 2017-03-01 NOTE — ED Notes (Addendum)
Pt given a ginger ale, per Clarise Cruz, Therapist, sports.

## 2017-03-01 NOTE — ED Notes (Signed)
ED Provider at bedside. 

## 2017-03-01 NOTE — ED Triage Notes (Signed)
Pt presents from PCP office with [facility] for tachycardia and eval for EKG; pt states she was being seen at her PCP office for bilat leg pain from knees to toes that is chronic; pt is also a quadraplegic; while at the PCP office, he noted her HR to be in the 120s and requested transfer here for EKG; pt denies CP, SOB

## 2017-03-01 NOTE — ED Notes (Signed)
Patient transported to x-ray. ?

## 2017-03-01 NOTE — ED Provider Notes (Signed)
[facility] EMERGENCY DEPARTMENT Provider Note   CSN: [id number] Arrival date & time: 03/01/17  1725     History   Chief Complaint Chief Complaint  Patient presents with  . Leg Pain  . Abnormal ECG    HPI April Graves is a 62 y.o. female.  Patient was at her primary care office secondary to multiple myalgia and arthralgia type complaints and was found to have a fast heart rate so sent here for further evaluation.  Has had some intermittent chest pain and shortness of breath.  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  Also has chronic pain.  Anemia chronic disease and hypokalemia at baseline.       Past Medical History:  Diagnosis Date  . Anemia of chronic disease   . Chronic pain syndrome   . Functional quadriplegia ([place])    noted in d/c summary 03/2015  . History of decubitus ulcer    infected, left glut/hip  . History of malnutrition   . Osteoarthritis of both shoulders   . Poor dentition   . Secondary osteoarthritis of left ankle    Post-traumatic; at the tibiotalar joint.  + severe disuse osteopenia.    Patient Active Problem List   Diagnosis Date Noted  . Decubitus ulcer of thigh, stage 4 ([place]) 03/08/2015  . Functional quadriplegia ([place]) 03/07/2015  . Anemia of chronic disease 03/07/2015  . Hypokalemia 10/15/2011    Past Surgical History:  Procedure Laterality Date  . KNEE CLOSED REDUCTION  10/16/2011   No fracture.  Procedure: CLOSED MANIPULATION KNEE;  Surgeon: Mauri Pole, MD;  Location: [facility];  Service: Orthopedics;;  Proxmial Fibula  . TUBAL LIGATION      OB History    No data available       Home Medications    Prior to Admission medications   Medication Sig Start Date End Date Taking? Authorizing Provider  acetaminophen (TYLENOL) 325 MG tablet Take 2 tablets (650 mg total) by mouth every 6 (six) hours as needed for mild  pain (or Fever >/= 101). Patient taking differently: Take 1,000 mg by mouth every 6 (six) hours as needed for mild pain (or Fever >/= 101).  03/13/15  Yes Robbie Lis, MD  albuterol (PROVENTIL HFA;VENTOLIN HFA) 108 (90 Base) MCG/ACT inhaler Inhale 1-2 puffs into the lungs every 6 (six) hours as needed for wheezing or shortness of breath.   Yes [provider]  cyclobenzaprine (FLEXERIL) 10 MG tablet Take 5 mg by mouth as needed. 08/17/14  Yes [provider]  meloxicam (MOBIC) 15 MG tablet Take 15 mg by mouth 2 (two) times daily as needed for pain.   Yes [provider]  Multiple Vitamin (MULTIVITAMIN WITH MINERALS) TABS tablet Take 1 tablet by mouth daily.   Yes [provider]  Amino Acids-Protein Hydrolys (FEEDING SUPPLEMENT, PRO-STAT SUGAR FREE 64,) LIQD Take 30 mLs by mouth 2 (two) times daily. Patient not taking: Reported on 03/01/2017 03/13/15   Robbie Lis, MD  amoxicillin-clavulanate (AUGMENTIN) 875-125 MG tablet Take 1 tablet by mouth 2 (two) times daily. Patient not taking: Reported on 03/01/2017 03/13/15   Robbie Lis, MD  doxycycline (VIBRA-TABS) 100 MG tablet Take 1 tablet (100 mg total) by mouth 2 (two) times daily. Patient not taking: Reported on 03/01/2017 03/13/15   Robbie Lis, MD  feeding supplement, ENSURE ENLIVE, (ENSURE ENLIVE) LIQD Take 237 mLs by  mouth 2 (two) times daily between meals. Patient not taking: Reported on 03/01/2017 03/13/15   Robbie Lis, MD  hydrocerin (EUCERIN) CREA Apply 1 application topically 2 (two) times daily. Patient not taking: Reported on 03/01/2017 03/13/15   Robbie Lis, MD  ondansetron (ZOFRAN) 4 MG tablet Take 1 tablet (4 mg total) by mouth every 6 (six) hours as needed for nausea. Patient not taking: Reported on 03/01/2017 03/13/15   Robbie Lis, MD  oxyCODONE-acetaminophen (PERCOCET) 5-325 MG tablet Take 1 tablet by mouth every 4 (four) hours as needed for severe pain. Patient not taking: Reported on  03/01/2017 03/13/15   Robbie Lis, MD    Family History Family History  Problem Relation Age of Onset  . Hypertension Unknown     Social History Social History   Tobacco Use  . Smoking status: Never Smoker  . Smokeless tobacco: Never Used  Substance Use Topics  . Alcohol use: No  . Drug use: No     Allergies   Codeine and Lisinopril   Review of Systems Review of Systems  Constitutional: Positive for activity change and appetite change.  Respiratory: Positive for cough, chest tightness and shortness of breath.   Genitourinary: Negative for dysuria.  Musculoskeletal: Positive for arthralgias, back pain, gait problem and myalgias.  All other systems reviewed and are negative.    Physical Exam Updated Vital Signs BP 134/79   Pulse (!) 114   Temp 98.2 F (36.8 C) (Oral)   Resp (!) 29   SpO2 99%   Physical Exam  Constitutional: She appears well-developed and well-nourished.  HENT:  Head: Normocephalic and atraumatic.  Eyes: Conjunctivae and EOM are normal.  Neck: Normal range of motion.  Cardiovascular: Regular rhythm. Tachycardia present.  Pulmonary/Chest: Effort normal. No stridor. Tachypnea noted. No respiratory distress.  Abdominal: Soft. She exhibits no distension.  Musculoskeletal: Normal range of motion. She exhibits no edema or deformity.  Neurological: She is alert. No cranial nerve deficit.  Contractures of BLE and RUE  Skin: Skin is warm and dry.  Nursing note and vitals reviewed.    ED Treatments / Results  Labs (all labs ordered are listed, but only abnormal results are displayed) Labs Reviewed  D-DIMER, QUANTITATIVE (NOT AT [facility]) - Abnormal; Notable for the following components:      Result Value   D-Dimer, Quant >20.00 (*)    All other components within normal limits  COMPREHENSIVE METABOLIC PANEL - Abnormal; Notable for the following components:   Sodium 134 (*)    CO2 21 (*)    Glucose, Bld 116 (*)    Total Protein 8.5 (*)     Albumin 3.2 (*)    Alkaline Phosphatase 231 (*)    All other components within normal limits  TROPONIN I  CBC WITH DIFFERENTIAL/PLATELET    EKG  EKG Interpretation  Date/Time:  Friday March 01 2017 17:34:11 EST Ventricular Rate:  101 PR Interval:    QRS Duration: 77 QT Interval:  323 QTC Calculation: 419 R Axis:   25 Text Interpretation:  Sinus tachycardia Borderline T wave abnormalities No significant change since last tracing in feb 2014 Confirmed by Merrily Pew [phone]) on 03/01/2017 5:53:52 PM       Radiology Dg Chest 2 View  Result Date: 03/01/2017 CLINICAL DATA:  Tachycardia EXAM: CHEST  2 VIEW COMPARISON:  03/07/2015 FINDINGS: AP and lateral views of the chest show no focal airspace consolidation, edema, or pleural effusion The cardiopericardial silhouette is within normal limits  for size. Bones are diffusely demineralized. Chronic deformity of the humeral heads bilaterally. Telemetry leads overlie the chest. IMPRESSION: No active cardiopulmonary disease. Electronically Signed   By: Misty Stanley M.D.   On: 03/01/2017 20:20   Dg Tibia/fibula Left  Result Date: 03/01/2017 CLINICAL DATA:  Question fracture, history of quadriplegia EXAM: LEFT TIBIA AND FIBULA - 2 VIEW COMPARISON:  08/04/2014 FINDINGS: Only a single view is submitted. Technologist reports unable to move patient to position for AP view. Old fracture deformity at the proximal fibula. Diffuse osteopenia. Possible remote fracture deformity of the posterior malleolar region of the distal tibia. IMPRESSION: Limited study secondary to osteopenia positioning. No definite acute osseous abnormality. Suspected old fracture deformities of the proximal fibula and distal tibia. Electronically Signed   By: Donavan Foil M.D.   On: 03/01/2017 22:27   Dg Tibia/fibula Right  Result Date: 03/01/2017 CLINICAL DATA:  Question fracture history of quadriplegia EXAM: RIGHT TIBIA AND FIBULA - 2 VIEW COMPARISON:  CT 10/15/2011 FINDINGS:  Only a single lateral views submitted. Technologist reports that patient was unable to be positioned for the AP view. Diffuse osteopenia limits the exam. No gross acute fracture deformity is seen. There are vascular calcifications. IMPRESSION: Limited study secondary to positioning and osteopenia. No gross acute osseous abnormality Electronically Signed   By: Donavan Foil M.D.   On: 03/01/2017 22:29    Procedures Procedures (including critical care time)  CRITICAL CARE Performed by: Merrily Pew Total critical care time: 35 minutes Critical care time was exclusive of separately billable procedures and treating other patients. Critical care was necessary to treat or prevent imminent or life-threatening deterioration. Critical care was time spent personally by me on the following activities: development of treatment plan with patient and/or surrogate as well as nursing, discussions with consultants, evaluation of patient's response to treatment, examination of patient, obtaining history from patient or surrogate, ordering and performing treatments and interventions, ordering and review of laboratory studies, ordering and review of radiographic studies, pulse oximetry and re-evaluation of patient's condition.   Medications Ordered in ED Medications  sodium chloride 0.9 % bolus 1,000 mL (not administered)  oxyCODONE-acetaminophen (PERCOCET/ROXICET) 5-325 MG per tablet 2 tablet (not administered)  ketorolac (TORADOL) 30 MG/ML injection 30 mg (not administered)  sodium chloride 0.9 % bolus 1,000 mL (1,000 mLs Intravenous New Bag/Given 03/01/17 1950)  HYDROmorphone (DILAUDID) injection 1 mg (1 mg Intravenous Given 03/01/17 1949)  iopamidol (ISOVUE-370) 76 % injection (100 mLs  Contrast Given 03/01/17 2222)     Initial Impression / Assessment and Plan / ED Course  I have reviewed the triage vital signs and the nursing notes.  Pertinent labs & imaging results that were available during my care of  the patient were reviewed by me and considered in my medical decision making (see chart for details).  eval for dehydration as likely cause. Also check for PE/ACS. Will treat pain in mean time.     Positive for PE. Heparin started. Will admit to medicine.  Final Clinical Impressions(s) / ED Diagnoses   Final diagnoses:  Tachycardia  Pain  Other acute pulmonary embolism without acute cor pulmonale (HCC)      Tenille Morrill, Corene Cornea, MD 03/01/17 2328

## 2017-03-02 ENCOUNTER — Inpatient Hospital Stay (HOSPITAL_COMMUNITY): Admit: 2017-03-02 | Payer: Medicare HMO

## 2017-03-02 ENCOUNTER — Encounter (HOSPITAL_COMMUNITY): Payer: Self-pay | Admitting: *Deleted

## 2017-03-02 ENCOUNTER — Other Ambulatory Visit (HOSPITAL_COMMUNITY): Payer: Medicare HMO

## 2017-03-02 ENCOUNTER — Inpatient Hospital Stay (HOSPITAL_COMMUNITY): Payer: Medicare HMO

## 2017-03-02 DIAGNOSIS — B351 Tinea unguium: Secondary | ICD-10-CM

## 2017-03-02 DIAGNOSIS — I2699 Other pulmonary embolism without acute cor pulmonale: Principal | ICD-10-CM

## 2017-03-02 DIAGNOSIS — R52 Pain, unspecified: Secondary | ICD-10-CM

## 2017-03-02 LAB — CBC
HCT: 36.5 % (ref 36.0–46.0)
HEMATOCRIT: 37.9 % (ref 36.0–46.0)
HEMOGLOBIN: 11.8 g/dL — AB (ref 12.0–15.0)
Hemoglobin: 12.5 g/dL (ref 12.0–15.0)
MCH: 27.4 pg (ref 26.0–34.0)
MCH: 28.1 pg (ref 26.0–34.0)
MCHC: 32.3 g/dL (ref 30.0–36.0)
MCHC: 33 g/dL (ref 30.0–36.0)
MCV: 84.9 fL (ref 78.0–100.0)
MCV: 85.2 fL (ref 78.0–100.0)
PLATELETS: 154 10*3/uL (ref 150–400)
Platelets: 147 10*3/uL — ABNORMAL LOW (ref 150–400)
RBC: 4.3 MIL/uL (ref 3.87–5.11)
RBC: 4.45 MIL/uL (ref 3.87–5.11)
RDW: 14.5 % (ref 11.5–15.5)
RDW: 14.4 % (ref 11.5–15.5)
WBC: 5.1 10*3/uL (ref 4.0–10.5)
WBC: 5.1 10*3/uL (ref 4.0–10.5)

## 2017-03-02 LAB — COMPREHENSIVE METABOLIC PANEL
ALK PHOS: 201 U/L — AB (ref 38–126)
ALT: 14 U/L (ref 14–54)
AST: 21 U/L (ref 15–41)
Albumin: 2.8 g/dL — ABNORMAL LOW (ref 3.5–5.0)
Anion gap: 8 (ref 5–15)
BILIRUBIN TOTAL: 0.8 mg/dL (ref 0.3–1.2)
BUN: 6 mg/dL (ref 6–20)
CALCIUM: 9.2 mg/dL (ref 8.9–10.3)
CO2: 19 mmol/L — AB (ref 22–32)
CREATININE: 0.41 mg/dL — AB (ref 0.44–1.00)
Chloride: 107 mmol/L (ref 101–111)
Glucose, Bld: 109 mg/dL — ABNORMAL HIGH (ref 65–99)
Potassium: 4.1 mmol/L (ref 3.5–5.1)
SODIUM: 134 mmol/L — AB (ref 135–145)
TOTAL PROTEIN: 7.1 g/dL (ref 6.5–8.1)

## 2017-03-02 LAB — HEPARIN LEVEL (UNFRACTIONATED)
HEPARIN UNFRACTIONATED: 0.53 [IU]/mL (ref 0.30–0.70)
Heparin Unfractionated: 0.6 IU/mL (ref 0.30–0.70)

## 2017-03-02 LAB — TROPONIN I
Troponin I: <0.03 ng/mL (ref <0.03)
Troponin I: <0.03 ng/mL (ref <0.03)

## 2017-03-02 LAB — HIV ANTIBODY (ROUTINE TESTING W REFLEX): HIV Screen 4th Generation wRfx: NONREACTIVE

## 2017-03-02 LAB — TSH: TSH: 1.795 u[IU]/mL (ref 0.350–4.500)

## 2017-03-02 MED ORDER — ALBUTEROL SULFATE (2.5 MG/3ML) 0.083% IN NEBU
2.5000 mg | INHALATION_SOLUTION | Freq: Four times a day (QID) | RESPIRATORY_TRACT | Status: DC | PRN
  Administered 2017-03-03: 2.5 mg via RESPIRATORY_TRACT
  Filled 2017-03-02: qty 3

## 2017-03-02 MED ORDER — POLYVINYL ALCOHOL 1.4 % OP SOLN
1.0000 [drp] | Freq: Every day | OPHTHALMIC | Status: DC | PRN
  Administered 2017-03-03 – 2017-03-04 (×2): 1 [drp] via OPHTHALMIC
  Filled 2017-03-02: qty 15

## 2017-03-02 MED ORDER — POLYETHYL GLYCOL-PROPYL GLYCOL 0.4-0.3 % OP SOLN: 1.0000 [drp] | Freq: Every day | OPHTHALMIC | Status: DC | PRN

## 2017-03-02 MED ORDER — ACETAMINOPHEN 650 MG RE SUPP: 650.0000 mg | Freq: Four times a day (QID) | RECTAL | Status: DC | PRN

## 2017-03-02 MED ORDER — RIVAROXABAN 20 MG PO TABS: 20.0000 mg | ORAL_TABLET | Freq: Every day | ORAL | Status: DC

## 2017-03-02 MED ORDER — ALBUTEROL SULFATE HFA 108 (90 BASE) MCG/ACT IN AERS: 1.0000 | INHALATION_SPRAY | Freq: Four times a day (QID) | RESPIRATORY_TRACT | Status: DC | PRN

## 2017-03-02 MED ORDER — SODIUM CHLORIDE 0.9 % IV SOLN
INTRAVENOUS | Status: AC
  Administered 2017-03-02: 05:00:00 via INTRAVENOUS

## 2017-03-02 MED ORDER — FLUCONAZOLE 100 MG PO TABS
200.0000 mg | ORAL_TABLET | Freq: Every day | ORAL | Status: DC
  Administered 2017-03-02 – 2017-03-04 (×3): 200 mg via ORAL
  Filled 2017-03-02 (×3): qty 2

## 2017-03-02 MED ORDER — PRO-STAT SUGAR FREE PO LIQD
30.0000 mL | Freq: Two times a day (BID) | ORAL | Status: DC
  Administered 2017-03-02 – 2017-03-04 (×5): 30 mL via ORAL
  Filled 2017-03-02 (×6): qty 30

## 2017-03-02 MED ORDER — OXYCODONE-ACETAMINOPHEN 5-325 MG PO TABS
2.0000 | ORAL_TABLET | ORAL | Status: DC | PRN
  Filled 2017-03-02: qty 2

## 2017-03-02 MED ORDER — RIVAROXABAN 15 MG PO TABS
15.0000 mg | ORAL_TABLET | Freq: Two times a day (BID) | ORAL | Status: DC
  Administered 2017-03-02 – 2017-03-04 (×4): 15 mg via ORAL
  Filled 2017-03-02 (×4): qty 1

## 2017-03-02 MED ORDER — ACETAMINOPHEN 325 MG PO TABS
650.0000 mg | ORAL_TABLET | Freq: Four times a day (QID) | ORAL | Status: DC | PRN
  Administered 2017-03-02 – 2017-03-04 (×3): 650 mg via ORAL
  Filled 2017-03-02 (×3): qty 2

## 2017-03-02 MED ORDER — KETOROLAC TROMETHAMINE 15 MG/ML IJ SOLN
15.0000 mg | Freq: Four times a day (QID) | INTRAMUSCULAR | Status: DC | PRN
  Administered 2017-03-03 – 2017-03-04 (×4): 15 mg via INTRAVENOUS
  Filled 2017-03-02 (×4): qty 1

## 2017-03-02 NOTE — Progress Notes (Signed)
Report received from the ED. Pt arrived to the unit at 1730 via bed.

## 2017-03-02 NOTE — Progress Notes (Signed)
ANTICOAGULATION CONSULT NOTE  Pharmacy Consult:  Heparin>>xarelto Indication: pulmonary embolus  Allergies  Allergen Reactions  . Codeine Anxiety and Other (See Comments)  . Lisinopril Anxiety and Other (See Comments)    Heart Races   . Pork-Derived Products     Patient reports she does not eat pork     Patient Measurements: Height: 5\' 4"  (162.6 cm) Weight: 140 lb (63.5 kg) IBW/kg (Calculated) : 54.7  Heparin dosing weight = 63 kg  Vital Signs: Temp: 98.5 F (36.9 C) (01/26 1254) BP: 103/60 (01/26 1300) Pulse Rate: 88 (01/26 1300)  Labs: Recent Labs    03/01/17 1940 03/02/17 0246 03/02/17 0601 03/02/17 0807 03/02/17 0925  HGB 13.2 11.8*  --  12.5  --   HCT 39.4 36.5  --  37.9  --   PLT 176 154  --  147*  --   HEPARINUNFRC  --   --  0.53  --  0.60  CREATININE 0.46 0.41*  --   --   --   TROPONINI <0.03 <0.03  --  <0.03  --     Estimated Creatinine Clearance: 64.6 mL/min (A) (by C-G formula based on SCr of 0.41 mg/dL (L)).   Assessment: 62yo female sent to ED by PCP for tachycardia, d-dimer found to be >20.  CT reveals partially occlusive thromboemboli to LLL, no evidence of RHS.  Pharmacy consulted to manage IV heparin. Now wanting to transition to xarelto after completed 24 hours of heparin.   Goal of Therapy:  Heparin level 0.3-0.7 units/ml Monitor platelets by anticoagulation protocol: Yes     Plan:  Stop heparin at 2300 tonight Xarelto 15mg  PO BID x 21 days followed by 20mg  daily - start after heparin drip turned off F/u S&S of bleeding  Salome Arnt, PharmD, BCPS 03/02/2017 3:42 PM

## 2017-03-02 NOTE — ED Notes (Signed)
Pt request copies of CT scan and xrays on disk; Ct and xray contacted to have disk made for patient-Monique,RN

## 2017-03-02 NOTE — ED Notes (Signed)
Pt placed on hospital bed for comfort, family given recliner.  Pt bedding and clothing changed as it had urine on it.  Pure wick placed on pt.

## 2017-03-02 NOTE — Progress Notes (Signed)
ANTICOAGULATION CONSULT NOTE  Pharmacy Consult:  Heparin Indication: pulmonary embolus  Allergies  Allergen Reactions  . Codeine Anxiety and Other (See Comments)  . Lisinopril Anxiety and Other (See Comments)    Heart Races   . Pork-Derived Products     Patient reports she does not eat pork     Patient Measurements: Height: 5\' 4"  (162.6 cm) Weight: 140 lb (63.5 kg) IBW/kg (Calculated) : 54.7  Heparin dosing weight = 63 kg  Vital Signs: Temp: 98.5 F (36.9 C) (01/26 1254) BP: 103/60 (01/26 1300) Pulse Rate: 88 (01/26 1300)  Labs: Recent Labs    03/01/17 1940 03/02/17 0246 03/02/17 0601 03/02/17 0807 03/02/17 0925  HGB 13.2 11.8*  --  12.5  --   HCT 39.4 36.5  --  37.9  --   PLT 176 154  --  147*  --   HEPARINUNFRC  --   --  0.53  --  0.60  CREATININE 0.46 0.41*  --   --   --   TROPONINI <0.03 <0.03  --  <0.03  --     Estimated Creatinine Clearance: 64.6 mL/min (A) (by C-G formula based on SCr of 0.41 mg/dL (L)).   Assessment: 62yo female sent to ED by PCP for tachycardia, d-dimer found to be >20.  CT reveals partially occlusive thromboemboli to LLL, no evidence of RHS.  Pharmacy consulted to manage IV heparin.  Heparin level is therapeutic; no bleeding reported.   Goal of Therapy:  Heparin level 0.3-0.7 units/ml Monitor platelets by anticoagulation protocol: Yes     Plan:  Continue heparin gtt at 1100 units/hr Daily heparin level and CBC   Rochel Privett D. Mina Marble, PharmD, BCPS Pager:  [phone] 03/02/2017, 1:49 PM

## 2017-03-02 NOTE — ED Triage Notes (Signed)
PT reports she is supposed to have joint injections next week at [facility] next week.

## 2017-03-02 NOTE — ED Notes (Signed)
Pt informed RN that she does not eat pork.  RN requested unit sec. Notify service response so that pt does not get Pork for breakfast.

## 2017-03-02 NOTE — Progress Notes (Signed)
PROGRESS NOTE [facility]   April Graves   JHE:[id number] DOB: 08-Jul-1955  DOA: 03/01/2017 PCP: Patient, No Pcp Per   Brief Narrative:  April Graves is a 62 year old female with history of functional quadriplegia, osteoarthritis who presented to the emergency department after being sent by her PCP when it was noted that she had sinus tachycardia.  Upon ED evaluation noted to have swollen leg, d-dimer was elevated and CT Angie was ordered which was positive for PE.  Patient admitted for working diagnosis of pulmonary embolism and IV heparin.  Subjective: Patient seen and examined, patient report severe pain on her knees and ankle. Patient is requesting cortisone injection. Xray of legs does not show acute process.   Assessment & Plan: PE  CTA shows acute PE with no evidence of right heart strain, partially occlusive thrombi embolism to the left lower lobe. Patient is paraplegic, although have some functionality Sedentary , denies any recent travel On IV heparin after 24 hr of heparin transition to Xarelto  ECHO pending Get venous Doppler No signs of hypoxia  Knee/leg pain in setting of paraplegia Pain control as needed Patient reported that last time she received a cortisone shot she was able to walk with walker.  I will not recommend joint injection at this time as patient is on full dose anticoagulation.  She will be at high risk for hemorrhagic joint, also risk for cortisone related gastritis and potential for GI bleed.  Patient can wait for this to be done as an outpatient.  Can manage with pain medications for now and physical therapy  Onychomycosis Will start Diflucan 200 mg daily for 4-week LFTs stable  Functional paraplegia  Will get PT eval for dipo   DVT prophylaxis: Heparin drip Code Status: Full code Family Communication: Daughter at bedside Disposition Plan: Home in the next 24 hours  Consultants:   None  Procedures:   Echo  pending  Antimicrobials:  None   Objective: Vitals:   03/02/17 0515 03/02/17 0618 03/02/17 1254 03/02/17 1300  BP:  108/73 104/62 103/60  Pulse: 83 85 90 88  Resp: 15 15 20 19   Temp:   98.5 F (36.9 C)   TempSrc:      SpO2: 98% 98% 99% 100%  Weight:      Height:        Intake/Output Summary (Last 24 hours) at 03/02/2017 1555 Last data filed at 03/02/2017 0221 Gross per 24 hour  Intake 2000 ml  Output -  Net 2000 ml   Filed Weights   03/01/17 2200  Weight: 63.5 kg (140 lb)    Examination:  General exam: Appears calm and comfortable  HEENT: OP moist and clear Respiratory system: Clear to auscultation. No wheezes,crackle or rhonchi Cardiovascular system: S1 & S2 heard, RRR. No JVD, murmurs, rubs or gallops Gastrointestinal system: Abdomen is nondistended, soft and nontender.  Central nervous system: Alert and oriented. No focal neurological deficits. Extremities: Lower extremity edema bilaterally, bilateral lower extremity slight contracted.  Onychomycosis Skin: Dry skin, no rash Psychiatry: Mood & affect appropriate.    Data Reviewed: I have personally reviewed following labs and imaging studies  CBC: Recent Labs  Lab 03/01/17 1940 03/02/17 0246 03/02/17 0807  WBC 7.1 5.1 5.1  NEUTROABS 5.1  --   --   HGB 13.2 11.8* 12.5  HCT 39.4 36.5 37.9  MCV 84.5 84.9 85.2  PLT 176 154 185*   Basic Metabolic Panel: Recent Labs  Lab 03/01/17 1940 03/02/17 0246  NA  134* 134*  K 4.1 4.1  CL 104 107  CO2 21* 19*  GLUCOSE 116* 109*  BUN 11 6  CREATININE 0.46 0.41*  CALCIUM 9.9 9.2   GFR: Estimated Creatinine Clearance: 64.6 mL/min (A) (by C-G formula based on SCr of 0.41 mg/dL (L)). Liver Function Tests: Recent Labs  Lab 03/01/17 1940 03/02/17 0246  AST 28 21  ALT 18 14  ALKPHOS 231* 201*  BILITOT 0.8 0.8  PROT 8.5* 7.1  ALBUMIN 3.2* 2.8*   No results for input(s): LIPASE, AMYLASE in the last 168 hours. No results for input(s): AMMONIA in the last  168 hours. Coagulation Profile: No results for input(s): INR, PROTIME in the last 168 hours. Cardiac Enzymes: Recent Labs  Lab 03/01/17 1940 03/02/17 0246 03/02/17 0807  TROPONINI <0.03 <0.03 <0.03   BNP (last 3 results) No results for input(s): PROBNP in the last 8760 hours. HbA1C: No results for input(s): HGBA1C in the last 72 hours. CBG: No results for input(s): GLUCAP in the last 168 hours. Lipid Profile: No results for input(s): CHOL, HDL, LDLCALC, TRIG, CHOLHDL, LDLDIRECT in the last 72 hours. Thyroid Function Tests: Recent Labs    03/02/17 0246  TSH 1.795   Anemia Panel: No results for input(s): VITAMINB12, FOLATE, FERRITIN, TIBC, IRON, RETICCTPCT in the last 72 hours. Sepsis Labs: No results for input(s): PROCALCITON, LATICACIDVEN in the last 168 hours.  No results found for this or any previous visit (from the past 240 hour(s)).    Radiology Studies: Dg Chest 2 View  Result Date: 03/01/2017 CLINICAL DATA:  Tachycardia EXAM: CHEST  2 VIEW COMPARISON:  03/07/2015 FINDINGS: AP and lateral views of the chest show no focal airspace consolidation, edema, or pleural effusion The cardiopericardial silhouette is within normal limits for size. Bones are diffusely demineralized. Chronic deformity of the humeral heads bilaterally. Telemetry leads overlie the chest. IMPRESSION: No active cardiopulmonary disease. Electronically Signed   By: Misty Stanley M.D.   On: 03/01/2017 20:20   Dg Tibia/fibula Left  Result Date: 03/01/2017 CLINICAL DATA:  Question fracture, history of quadriplegia EXAM: LEFT TIBIA AND FIBULA - 2 VIEW COMPARISON:  08/04/2014 FINDINGS: Only a single view is submitted. Technologist reports unable to move patient to position for AP view. Old fracture deformity at the proximal fibula. Diffuse osteopenia. Possible remote fracture deformity of the posterior malleolar region of the distal tibia. IMPRESSION: Limited study secondary to osteopenia positioning. No  definite acute osseous abnormality. Suspected old fracture deformities of the proximal fibula and distal tibia. Electronically Signed   By: Donavan Foil M.D.   On: 03/01/2017 22:27   Dg Tibia/fibula Right  Result Date: 03/01/2017 CLINICAL DATA:  Question fracture history of quadriplegia EXAM: RIGHT TIBIA AND FIBULA - 2 VIEW COMPARISON:  CT 10/15/2011 FINDINGS: Only a single lateral views submitted. Technologist reports that patient was unable to be positioned for the AP view. Diffuse osteopenia limits the exam. No gross acute fracture deformity is seen. There are vascular calcifications. IMPRESSION: Limited study secondary to positioning and osteopenia. No gross acute osseous abnormality Electronically Signed   By: Donavan Foil M.D.   On: 03/01/2017 22:29   Ct Angio Chest Pe W And/or Wo Contrast  Result Date: 03/01/2017 CLINICAL DATA:  Intermittent upper chest pain for several months. EXAM: CT ANGIOGRAPHY CHEST WITH CONTRAST TECHNIQUE: Multidetector CT imaging of the chest was performed using the standard protocol during bolus administration of intravenous contrast. Multiplanar CT image reconstructions and MIPs were obtained to evaluate the vascular anatomy. CONTRAST:  70 cc ISOVUE-370 IOPAMIDOL (ISOVUE-370) INJECTION 76% COMPARISON:  03/18/2012 chest CT FINDINGS: Cardiovascular: Normal branch pattern of the great vessels. No aortic aneurysm or dissection. Good opacification of the pulmonary arterial system. Nonocclusive thrombo embolus noted within the lobar, segmental and subsegmental pulmonary arteries to the left lower lobe. No right heart strain. RV/LV ratio 0.61. Mediastinum/Nodes: No enlarged mediastinal, hilar, or axillary lymph nodes. Fat containing axillary lymph nodes bilaterally likely reactive. The thyroid gland, trachea, and esophagus demonstrate no significant findings. Lungs/Pleura: Bronchiectasis to both lower lobes with atelectasis. Mild pleural thickening along the right major fissure.  No pneumothorax or effusion. Upper Abdomen: No acute abnormality. Musculoskeletal: No chest wall abnormality. No acute nor suspicious osseous abnormalities. Remodeled appearance of the glenohumeral joints with erosive change bilaterally as before. Review of the MIP images confirms the above findings. IMPRESSION: Positive for acute PE without CTevidence of right heart strain (RV/LV Ratio = 0.61). Partially occlusive thromboemboli to the left lower lobe are noted. No active pulmonary disease.  Bibasilar atelectasis is noted. These results were called by telephone at the time of interpretation on 03/01/2017 at 10:50 pm to Dr. Merrily Pew , who verbally acknowledged these results. Electronically Signed   By: Ashley Royalty M.D.   On: 03/01/2017 22:50      Scheduled Meds: . feeding supplement (PRO-STAT SUGAR FREE 64)  30 mL Oral BID  . rivaroxaban  15 mg Oral BID   Followed by  . [START ON 03/24/2017] rivaroxaban  20 mg Oral Q supper   Continuous Infusions: . heparin 1,100 Units/hr (03/02/17 0005)     LOS: 1 day    Time spent: Total of 25 minutes spent with pt, greater than 50% of which was spent in discussion of  treatment, counseling and coordination of care   Chipper Oman, MD Pager: Text Page via [email or web address]   If 7PM-7AM, please contact night-coverage [email or web address] 03/02/2017, 3:55 PM

## 2017-03-03 ENCOUNTER — Inpatient Hospital Stay (HOSPITAL_COMMUNITY): Payer: Medicare HMO

## 2017-03-03 DIAGNOSIS — R609 Edema, unspecified: Secondary | ICD-10-CM

## 2017-03-03 LAB — CBC
HEMATOCRIT: 33.7 % — AB (ref 36.0–46.0)
Hemoglobin: 10.8 g/dL — ABNORMAL LOW (ref 12.0–15.0)
MCH: 27.4 pg (ref 26.0–34.0)
MCHC: 32 g/dL (ref 30.0–36.0)
MCV: 85.5 fL (ref 78.0–100.0)
PLATELETS: 157 10*3/uL (ref 150–400)
RBC: 3.94 MIL/uL (ref 3.87–5.11)
RDW: 14.6 % (ref 11.5–15.5)
WBC: 4.1 10*3/uL (ref 4.0–10.5)

## 2017-03-03 MED ORDER — PNEUMOCOCCAL VAC POLYVALENT 25 MCG/0.5ML IJ INJ
0.5000 mL | INJECTION | INTRAMUSCULAR | Status: AC
  Administered 2017-03-04: 0.5 mL via INTRAMUSCULAR
  Filled 2017-03-03: qty 0.5

## 2017-03-03 MED ORDER — RIVAROXABAN (XARELTO) VTE STARTER PACK (15 & 20 MG): ORAL_TABLET | ORAL | 0 refills | Status: DC

## 2017-03-03 MED ORDER — FLUCONAZOLE 200 MG PO TABS: 200.0000 mg | ORAL_TABLET | Freq: Every day | ORAL | 0 refills | Status: DC

## 2017-03-03 MED ORDER — ONDANSETRON HCL 4 MG/2ML IJ SOLN: 4.0000 mg | Freq: Once | INTRAMUSCULAR | Status: DC

## 2017-03-03 MED ORDER — OXYCODONE-ACETAMINOPHEN 5-325 MG PO TABS: 2.0000 | ORAL_TABLET | Freq: Four times a day (QID) | ORAL | 0 refills | Status: DC | PRN

## 2017-03-03 NOTE — Discharge Summary (Signed)
Physician Discharge Summary  April Graves  XQJ:[id number]  DOB: [birth year]  DOA: 03/01/2017 PCP: Patient, No Pcp Per  Admit date: 03/01/2017 Discharge date: 03/03/2017  Admitted From: Home  Disposition: Home - Patient refused SNF   Recommendations for Outpatient Follow-up:  1. Follow up with PCP in 1-2 weeks 2. Follow up with Orthopedic in 1-2 weeks  3. Please obtain BMP/CBC in one week to monitor Hgb and Cr  4. Outpatient PT eval   Home Health: PT/OT, RN, Aid  Equipment/Devices: Wheelchair   Discharge Condition: Stable  CODE STATUS: Full code  Diet recommendation: Heart Healthy  Brief/Interim Summary: For full details see H&P/Progress note but in brief April Graves is a 62 year old female with history of functional quadriplegia, osteoarthritis who presented to the emergency department after being sent by her PCP when it was noted that she had sinus tachycardia.  Upon ED evaluation noted to have swollen leg, d-dimer was elevated and CT Angie was ordered which was positive for PE.  Patient admitted for working diagnosis of pulmonary embolism and IV heparin. Patient was kept on heparin for 24 hrs, subsequently transitioned to Xarelto. Patient LE were evaluated as she complained of pain of her ankle and knee, xray were done which showed no acute fractures. Patient was given pain medication which improved her pain. Patient was evaluated by PT whom recommended SNF, but patient declined and reported that she rather go home. Patient was deemed stable for discharge.   Subjective: Patient seen and examined, she denies chest pain, SOB and palpitation. Patient requesting cortisone shot.   Discharge Diagnoses/Hospital Course:  PE  CTA shows acute PE with no evidence of right heart strain, partially occlusive thrombi embolism to the left lower lobe. Patient is paraplegic, although have some functionality she is sedentary , denies any recent travel Initially treated with Heparin ggt,  transitioned to Xarelto  Doppler doppler prelimb negative for DVT  Can obtain ECHO as outpatient. CT with no evidence of R heart strain. Hemodynamically stable  No signs of hypoxia  Functional paraplegia  Knee/leg pain in setting of paraplegia Pain control as needed Patient continue to request cortisone injection. Again I recommended against this for the following reason, Patient is on full dose anticoagulation.  She will be at high risk for hemorrhagic joint, also risk for cortisone related gastritis and potential for GI bleed. This is a chronic issue that can wait to be evaluated as outpatient.   Have a long discussion, after physical therapy evaluation, recommending SNF for to maximize strength, but patient adamantly refusing this option, stating that she rather go home. Discussed with physical therapy and will arrange home health PT. Orthopedic evaluation can be performed as outpatient for contractions.   Will prescribe Percocet for 3 day and follow up with PCP   Onychomycosis Diflucan 200 mg daily for 4-week LFTs stable  Elevated BP w/o diagnosis of HTN  Felt to be du to pain Will not start antihypertensive medications at this time  Advised to follow up with PCP   All other chronic medical condition were stable during the hospitalization.  Patient was seen by physical therapy, recommending SNF, patient declined  On the day of the discharge the patient's vitals were stable, and no other acute medical condition were reported by patient.   Discharge Instructions  You were cared for by a hospitalist during your hospital stay. If you have any questions about your discharge medications or the care you received while you were in the hospital after  you are discharged, you can call the unit and asked to speak with the hospitalist on call if the hospitalist that took care of you is not available. Once you are discharged, your primary care physician will handle any further medical issues.  Please note that NO REFILLS for any discharge medications will be authorized once you are discharged, as it is imperative that you return to your primary care physician (or establish a relationship with a primary care physician if you do not have one) for your aftercare needs so that they can reassess your need for medications and monitor your lab values.  Discharge Instructions    Call MD for:  difficulty breathing, headache or visual disturbances   Complete by:  As directed    Call MD for:  extreme fatigue   Complete by:  As directed    Call MD for:  hives   Complete by:  As directed    Call MD for:  persistant dizziness or light-headedness   Complete by:  As directed    Call MD for:  persistant nausea and vomiting   Complete by:  As directed    Call MD for:  redness, tenderness, or signs of infection (pain, swelling, redness, odor or green/yellow discharge around incision site)   Complete by:  As directed    Call MD for:  severe uncontrolled pain   Complete by:  As directed    Call MD for:  temperature >100.4   Complete by:  As directed    Diet - low sodium heart healthy   Complete by:  As directed    Increase activity slowly   Complete by:  As directed      Allergies as of 03/03/2017      Reactions   Codeine Anxiety, Other (See Comments)   Lisinopril Anxiety, Other (See Comments)   Heart Races   Pork-derived Products    Patient reports she does not eat pork       Medication List    STOP taking these medications   amoxicillin-clavulanate 875-125 MG tablet Commonly known as:  AUGMENTIN   doxycycline 100 MG tablet Commonly known as:  VIBRA-TABS   feeding supplement (ENSURE ENLIVE) Liqd   hydrocerin Crea   ondansetron 4 MG tablet Commonly known as:  ZOFRAN     TAKE these medications   acetaminophen 325 MG tablet Commonly known as:  TYLENOL Take 2 tablets (650 mg total) by mouth every 6 (six) hours as needed for mild pain (or Fever >/= 101). What changed:  how  much to take   albuterol 108 (90 Base) MCG/ACT inhaler Commonly known as:  PROVENTIL HFA;VENTOLIN HFA Inhale 1-2 puffs into the lungs every 6 (six) hours as needed for wheezing or shortness of breath.   cyclobenzaprine 10 MG tablet Commonly known as:  FLEXERIL Take 5 mg by mouth as needed.   feeding supplement (PRO-STAT SUGAR FREE 64) Liqd Take 30 mLs by mouth 2 (two) times daily.   fluconazole 200 MG tablet Commonly known as:  DIFLUCAN Take 1 tablet (200 mg total) by mouth daily. Start taking on:  03/04/2017   LUBRICANT EYE DROPS 0.4-0.3 % Soln Generic drug:  Polyethyl Glycol-Propyl Glycol Place 1 drop into the right eye daily as needed (eye lubricant).   meloxicam 15 MG tablet Commonly known as:  MOBIC Take 15 mg by mouth 2 (two) times daily as needed for pain.   multivitamin with minerals Tabs tablet Take 1 tablet by mouth daily.   oxyCODONE-acetaminophen 5-325  MG tablet Commonly known as:  PERCOCET/ROXICET Take 2 tablets by mouth every 6 (six) hours as needed for moderate pain. What changed:    how much to take  when to take this  reasons to take this   [place] 1 application into the right eye daily as needed (pain). Eye ointment/drop for pain   Rivaroxaban 15 & 20 MG Tbpk Take as directed on package: Start with one 15mg  tablet by mouth twice a day with food. On Day 22, switch to one 20mg  tablet once a day with food.            Durable Medical Equipment  (From admission, onward)        Start     Ordered   03/03/17 1558  For home use only DME wheelchair cushion (seat and back)  (Wheelchairs)  Once     03/03/17 1559      Allergies  Allergen Reactions  . Codeine Anxiety and Other (See Comments)  . Lisinopril Anxiety and Other (See Comments)    Heart Races   . Pork-Derived Products     Patient reports she does not eat pork     Consultations:  None   Procedures/Studies: Dg Chest 2 View  Result Date: 03/01/2017 CLINICAL  DATA:  Tachycardia EXAM: CHEST  2 VIEW COMPARISON:  03/07/2015 FINDINGS: AP and lateral views of the chest show no focal airspace consolidation, edema, or pleural effusion The cardiopericardial silhouette is within normal limits for size. Bones are diffusely demineralized. Chronic deformity of the humeral heads bilaterally. Telemetry leads overlie the chest. IMPRESSION: No active cardiopulmonary disease. Electronically Signed   By: Misty Stanley M.D.   On: 03/01/2017 20:20   Dg Tibia/fibula Left  Result Date: 03/01/2017 CLINICAL DATA:  Question fracture, history of quadriplegia EXAM: LEFT TIBIA AND FIBULA - 2 VIEW COMPARISON:  08/04/2014 FINDINGS: Only a single view is submitted. Technologist reports unable to move patient to position for AP view. Old fracture deformity at the proximal fibula. Diffuse osteopenia. Possible remote fracture deformity of the posterior malleolar region of the distal tibia. IMPRESSION: Limited study secondary to osteopenia positioning. No definite acute osseous abnormality. Suspected old fracture deformities of the proximal fibula and distal tibia. Electronically Signed   By: Donavan Foil M.D.   On: 03/01/2017 22:27   Dg Tibia/fibula Right  Result Date: 03/01/2017 CLINICAL DATA:  Question fracture history of quadriplegia EXAM: RIGHT TIBIA AND FIBULA - 2 VIEW COMPARISON:  CT 10/15/2011 FINDINGS: Only a single lateral views submitted. Technologist reports that patient was unable to be positioned for the AP view. Diffuse osteopenia limits the exam. No gross acute fracture deformity is seen. There are vascular calcifications. IMPRESSION: Limited study secondary to positioning and osteopenia. No gross acute osseous abnormality Electronically Signed   By: Donavan Foil M.D.   On: 03/01/2017 22:29   Ct Angio Chest Pe W And/or Wo Contrast  Result Date: 03/01/2017 CLINICAL DATA:  Intermittent upper chest pain for several months. EXAM: CT ANGIOGRAPHY CHEST WITH CONTRAST TECHNIQUE:  Multidetector CT imaging of the chest was performed using the standard protocol during bolus administration of intravenous contrast. Multiplanar CT image reconstructions and MIPs were obtained to evaluate the vascular anatomy. CONTRAST:  70 cc ISOVUE-370 IOPAMIDOL (ISOVUE-370) INJECTION 76% COMPARISON:  03/18/2012 chest CT FINDINGS: Cardiovascular: Normal branch pattern of the great vessels. No aortic aneurysm or dissection. Good opacification of the pulmonary arterial system. Nonocclusive thrombo embolus noted within the lobar, segmental and subsegmental pulmonary arteries to the  left lower lobe. No right heart strain. RV/LV ratio 0.61. Mediastinum/Nodes: No enlarged mediastinal, hilar, or axillary lymph nodes. Fat containing axillary lymph nodes bilaterally likely reactive. The thyroid gland, trachea, and esophagus demonstrate no significant findings. Lungs/Pleura: Bronchiectasis to both lower lobes with atelectasis. Mild pleural thickening along the right major fissure. No pneumothorax or effusion. Upper Abdomen: No acute abnormality. Musculoskeletal: No chest wall abnormality. No acute nor suspicious osseous abnormalities. Remodeled appearance of the glenohumeral joints with erosive change bilaterally as before. Review of the MIP images confirms the above findings. IMPRESSION: Positive for acute PE without CTevidence of right heart strain (RV/LV Ratio = 0.61). Partially occlusive thromboemboli to the left lower lobe are noted. No active pulmonary disease.  Bibasilar atelectasis is noted. These results were called by telephone at the time of interpretation on 03/01/2017 at 10:50 pm to Dr. Merrily Pew , who verbally acknowledged these results. Electronically Signed   By: Ashley Royalty M.D.   On: 03/01/2017 22:50    Discharge Exam: Vitals:   03/03/17 0531 03/03/17 0600  BP:  (!) 152/108  Pulse:  99  Resp:  18  Temp:  (!) 97.4 F (36.3 C)  SpO2: 100% 99%   Vitals:   03/02/17 1838 03/02/17 2240 03/03/17  0531 03/03/17 0600  BP: 123/81 (!) 152/88  (!) 152/108  Pulse: 88 90  99  Resp: 17 18  18   Temp: 98.8 F (37.1 C) 98.1 F (36.7 C)  (!) 97.4 F (36.3 C)  TempSrc:  Oral    SpO2: 99% 100% 100% 99%  Weight: 73.9 kg (163 lb)   75.2 kg (165 lb 12.6 oz)  Height: 5\' 4"  (1.626 m)       General: NAD Cardiovascular: RRR, S1/S2 +, no rubs, no gallops Respiratory: CTA bilaterally, no wheezing, no rhonchi Abdominal: Soft, NT, ND, bowel sounds + Extremities: No edema, LE mild contraction, but able to move. Onychomycosis   The results of significant diagnostics from this hospitalization (including imaging, microbiology, ancillary and laboratory) are listed below for reference.     Microbiology: No results found for this or any previous visit (from the past 240 hour(s)).   Labs: BNP (last 3 results) No results for input(s): BNP in the last 8760 hours. Basic Metabolic Panel: Recent Labs  Lab 03/01/17 1940 03/02/17 0246  NA 134* 134*  K 4.1 4.1  CL 104 107  CO2 21* 19*  GLUCOSE 116* 109*  BUN 11 6  CREATININE 0.46 0.41*  CALCIUM 9.9 9.2   Liver Function Tests: Recent Labs  Lab 03/01/17 1940 03/02/17 0246  AST 28 21  ALT 18 14  ALKPHOS 231* 201*  BILITOT 0.8 0.8  PROT 8.5* 7.1  ALBUMIN 3.2* 2.8*   No results for input(s): LIPASE, AMYLASE in the last 168 hours. No results for input(s): AMMONIA in the last 168 hours. CBC: Recent Labs  Lab 03/01/17 1940 03/02/17 0246 03/02/17 0807 03/03/17 0320  WBC 7.1 5.1 5.1 4.1  NEUTROABS 5.1  --   --   --   HGB 13.2 11.8* 12.5 10.8*  HCT 39.4 36.5 37.9 33.7*  MCV 84.5 84.9 85.2 85.5  PLT 176 154 147* 157   Cardiac Enzymes: Recent Labs  Lab 03/01/17 1940 03/02/17 0246 03/02/17 0807 03/02/17 1438  TROPONINI <0.03 <0.03 <0.03 <0.03   BNP: Invalid input(s): POCBNP CBG: No results for input(s): GLUCAP in the last 168 hours. D-Dimer Recent Labs    03/01/17 1940  DDIMER >20.00*   Hgb A1c No results for  input(s):  HGBA1C in the last 72 hours. Lipid Profile No results for input(s): CHOL, HDL, LDLCALC, TRIG, CHOLHDL, LDLDIRECT in the last 72 hours. Thyroid function studies Recent Labs    03/02/17 0246  TSH 1.795   Anemia work up No results for input(s): VITAMINB12, FOLATE, FERRITIN, TIBC, IRON, RETICCTPCT in the last 72 hours. Urinalysis    Component Value Date/Time   COLORURINE AMBER (A) 03/07/2015 1226   APPEARANCEUR CLEAR 03/07/2015 1226   LABSPEC 1.027 03/07/2015 1226   PHURINE 6.0 03/07/2015 1226   GLUCOSEU NEGATIVE 03/07/2015 1226   HGBUR NEGATIVE 03/07/2015 1226   BILIRUBINUR SMALL (A) 03/07/2015 1226   KETONESUR NEGATIVE 03/07/2015 1226   PROTEINUR 30 (A) 03/07/2015 1226   UROBILINOGEN 1.0 10/22/2011 0845   NITRITE NEGATIVE 03/07/2015 1226   LEUKOCYTESUR NEGATIVE 03/07/2015 1226   Sepsis Labs Invalid input(s): PROCALCITONIN,  WBC,  LACTICIDVEN Microbiology No results found for this or any previous visit (from the past 240 hour(s)).  Time coordinating discharge: 32 minutes  SIGNED:  Chipper Oman, MD  [facility] 03/03/2017, 4:26 PM  Pager please text page via  [email or web address]

## 2017-03-03 NOTE — Evaluation (Signed)
Physical Therapy Evaluation Patient Details Name: April Graves MRN: [id number] DOB: [birth year] Today's Date: 03/03/2017   History of Present Illness  April Graves is a 62 year old female with history of functional quadriplegia, osteoarthritis who presented to the emergency department after being sent by her PCP when it was noted that she had sinus tachycardia.  Upon ED evaluation noted to have swollen leg, d-dimer was elevated and CT Angie was ordered which was positive for PE.  Patient admitted for working diagnosis of pulmonary embolism and IV heparin.  Clinical Impression   Pt admitted with above diagnosis. Pt currently with functional limitations due to the deficits listed below (see PT Problem List). April Graves has experienced a decline in function, currently needing +2 assistance to get OOB to her wheelchair; she lives with her daughter who works days, and when home alone is confined to her bed or wheelchair; Very painful with moving on today's eval, and required +2 assist to move to the EOB to sit up;  Pt will benefit from skilled PT to increase their independence and safety with mobility to allow discharge to the venue listed below.    We had a lengthy discussion re: dc options, and I discussed post-acute rehab, CIR versus SNF; In describing CIR, April Graves did indicate she was not sure that she could participate in 3 hours of PT daily -- this is reasonable, given her performance with me; we then discussed SNF for rehab, and I highly encouraged her to consider SNF for rehab to maximize independence and safety with mobility prior to dc home;   Discussed the situation with Dr. Quincy Simmonds, who told me she is refusing SNF for rehab; Given this, for home DC I recommend:    As close to 24 hour assistance as possible; consult Case Mgr to see about qualifying for HHAide, HHRN;   Pressure redistributing air mattress overlay for hospital bed;   [facility] lift;   Ambulance ride home;   Specialty  Wheelchair evaluation -- this will likely need to be done with Outpatient PT ([facility] [phone] performs these evaluations)  SW consult to get pt setup with [facility] or regular medical [facility] transport  Throughout our session, April Graves repeated that she wants to consult Orthopedics to see what can be done about her knee contractures; I relayed this to Dr. Quincy Simmonds     Follow Up Recommendations Other (comment);Supervision/Assistance - 24 hour(Post-acute rehabilitation; See above as it seems April Graves is declining SNF) -- worth considering re-starting HHPT/OT (especially for hoyer training); am not sure if she can still go to Outpatient PT for wheelchair eval while doing [place] course    Equipment Recommendations  Wheelchair (measurements PT);Wheelchair cushion (measurements PT);Other (comment)(Hoyer Lift; ambulance transport; specialty WC, pressure redistributing mattress for hospital bed)    Recommendations for Other Services OT consult(ordered per protocol; SW consult for consider SNF)     Precautions / Restrictions Precautions Precautions: Fall Restrictions Weight Bearing Restrictions: No Other Position/Activity Restrictions: Noted bil knee flexion contractures; Decr tolerance of any movement of LEs      Mobility  Bed Mobility Overal bed mobility: Needs Assistance Bed Mobility: Supine to Sit     Supine to sit: +2 for physical assistance;Total assist     General bed mobility comments: Used HOB to elevate pt's trunk closer to upright; encouraged semi-R-sidelie as hips, knees, and feet moved to EOB, using bed pad to cradle hips; as knees and feet were eased off of the bed, April Graves extended  hips and trunk, getting into a semi-supine trunk-extended position on bed; very painful, and requiring +2 to elevate trunk to sit  Transfers                 General transfer comment: Unable today  Ambulation/Gait                Stairs             Wheelchair Mobility    Modified Rankin (Stroke Patients Only)       Balance Overall balance assessment: Needs assistance   Sitting balance-Leahy Scale: Fair Sitting balance - Comments: Close guard, but able to sit EOB without physical assistance once balanced                                     Pertinent Vitals/Pain Pain Assessment: Faces Faces Pain Scale: Hurts whole lot Pain Location: Cries out in pain with rolling, or any movement of LEs Pain Descriptors / Indicators: Grimacing;Guarding;Crying Pain Intervention(s): Monitored during session;Repositioned    Home Living Family/patient expects to be discharged to:: Unsure Living Arrangements: Children(lives with daughter who works) Available Help at Discharge: Family;Available PRN/intermittently(Daughter works, son comes over daily) Type of Home: House Home Access: Stairs to enter Entrance Stairs-Rails: None Entrance Stairs-Number of Steps: 1(building a ramp soon) Home Layout: Two level;Able to live on main level with bedroom/bathroom Home Equipment: Wheelchair - manual;Hospital bed;Other (comment)(Need more info re: bathroom setup)      Prior Function Level of Independence: Needs assistance   Gait / Transfers Assistance Needed: Dependent on children for transfers; Lately has needed two-person assist, and, judging by mobility assessment, I would gues son has been lifting her  ADL's / Homemaking Assistance Needed: Daughter assist; wears Depends-type undergarment during the day while daughter is at work  Comments: Need more info re: ADLs/bathing     Hand Dominance        Extremity/Trunk Assessment   Upper Extremity Assessment Upper Extremity Assessment: Defer to OT evaluation    Lower Extremity Assessment Lower Extremity Assessment: RLE deficits/detail;LLE deficits/detail RLE Deficits / Details: Knee flexion contracture; 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 greater than approx 98 deg; ankle plantarflexion  contractures; Hip ROM WFL for simple mobility; very painful with assessment LLE Deficits / Details: Knee flexion contracture; 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 greater than approx 98 deg; ankle plantarflexion contractures; Hip ROM WFL for simple mobility       Communication   Communication: No difficulties;Other (comment)(at times difficult to understand; son and daughter help)  Cognition Arousal/Alertness: Awake/alert Behavior During Therapy: WFL for tasks assessed/performed Overall Cognitive Status: Within Functional Limits for tasks assessed                                 General Comments: requires frequent redirection to task at hand; almost perseverative re: needing surgery for her knee flexion contractures      General Comments General comments (skin integrity, edema, etc.): Lengthy discussion re: dc planning, rehab options, and longer-term options for moving better, including getting a specialty wheelchair and a hoyer lift for in-home, as well as looking into consistent HHAides    Exercises     Assessment/Plan    PT Assessment Patient needs continued PT services  PT Problem List Decreased strength;Decreased range of motion;Decreased activity tolerance;Decreased balance;Decreased mobility;Decreased coordination;Decreased knowledge of use of DME;Decreased safety awareness;Decreased knowledge  of precautions;Impaired tone;Pain       PT Treatment Interventions DME instruction;Functional mobility training;Therapeutic activities;Therapeutic exercise;Balance training;Neuromuscular re-education;Cognitive remediation;Patient/family education;Wheelchair mobility training;Manual techniques    PT Goals (Current goals can be found in the Care Plan section)  Acute Rehab PT Goals Patient Stated Goal: to move better; she is very focused on getting an Orthopedic consult PT Goal Formulation: With patient/family Time For Goal Achievement: 03/17/17 Potential to Achieve Goals: Fair     Frequency Min 2X/week   Barriers to discharge Decreased caregiver support Daughter works days; she is confined to wheelchair or bed when home alone    Co-evaluation               AM-PAC PT "6 Clicks" Daily Activity  Outcome Measure Difficulty turning over in bed (including adjusting bedclothes, sheets and blankets)?: Unable Difficulty moving from lying on back to sitting on the side of the bed? : Unable Difficulty sitting down on and standing up from a chair with arms (e.g., wheelchair, bedside commode, etc,.)?: Unable Help needed moving to and from a bed to chair (including a wheelchair)?: Total Help needed walking in hospital room?: Total Help needed climbing 3-5 steps with a railing? : Total 6 Click Score: 6    End of Session Equipment Utilized During Treatment: Other (comment)(bed pad) Activity Tolerance: Patient limited by pain Patient left: in bed;with call bell/phone within reach;with family/visitor present;Other (comment)(LEs padded and heels floated) Nurse Communication: Mobility status;Need for lift equipment PT Visit Diagnosis: Muscle weakness (generalized) (M62.81);Other abnormalities of gait and mobility (R26.89);Difficulty in walking, not elsewhere classified (R26.2);Other symptoms and signs involving the nervous system (R29.898);Pain Pain - Right/Left: (Bilat) Pain - part of body: Leg;Ankle and joints of foot;Hip;Knee    Time: 1351-1455(end time is approximate) PT Time Calculation (min) (ACUTE ONLY): 64 min   Charges:   PT Evaluation $PT Eval Moderate Complexity: 1 Mod PT Treatments $Therapeutic Activity: 23-37 mins $Self Care/Home Management: 8-22   PT G Codes:        Roney Marion, PT  Acute Rehabilitation Services Pager [phone] Office [phone]   [place] 03/03/2017, 3:46 PM

## 2017-03-03 NOTE — Progress Notes (Signed)
Pt refused D/C instructions, she insisted on staying. MD and charge nurse notified.

## 2017-03-03 NOTE — Care Management Note (Signed)
Case Management Note  Patient Details  Name: April Graves MRN: [id number] Date of Birth: 1955-07-25  Subjective/Objective:       Pt presented for tachycardia and treated for PE.  Pt from home with daughter.  Pt has hospital bed and manual wheelchair. Pt states she has requested specialty mattress and wheelchair from [facility], as that is where her hospital bed was supplied from.  Her requests have not been fulfilled. Pt states that both mattress and chair are around 61 years old.  Pt refused SNF and requests HH.         Action/Plan: HH choice offered to pt and daughter.  [facility] chosen and referral given to [employer].  [place] planned for 03/05/17 as pt states she is not going to leave tonight.    OP PT referral placed for specialty wheelchair eval.  Explained to pt that she will have to go to office for eval.  Jermaine with [facility] notified of need for [employer] and specialty mattress.  These needs will be assessed tomorrow as they are complex to arrange.    Expected Discharge Date:  03/03/17               Expected Discharge Plan:  [place]  In-House Referral:  NA  Discharge planning Services  CM Consult  Post Acute Care Choice:  Durable Medical Equipment, Home Health Choice offered to:  Patient, Adult Children  DME Arranged:  (will refer to OPPT for eval for specialty w/c.  Requests for mattress overlay and hoyer lift given to [employer] with [facility].  ) DME Agency:  [place] Arranged:  RN, PT, OT, NA HH Agency:  [place]  Status of Service:  In process, will continue to follow  If discussed at Long Length of Stay Meetings, dates discussed:    Additional Comments: Pt and family state she cannot leave tonight because her BP is high and she does not have a ride.  Explained to family that she has been deemed stable for discharge and insurance may not cover tonight.  Offered ambulance transport but pt declined staying she would rather  ride with a friend who usually transports her and he cannot come tonight.  Bedside RN advised.    Arley Phenix, RN 03/03/2017, 5:28 PM

## 2017-03-03 NOTE — Progress Notes (Signed)
Bilateral lower extremity venous duplex completed. Technically limited due to contractions of the lower extremities. No obvious evidence of DVT or superficial thrombosis in the segments able to be visualized. Unable to evaluate for a Baker's cyst. Inconclusive study. [facility], [place] 03/03/2017 10:06 AM

## 2017-03-03 NOTE — Discharge Instructions (Addendum)
Information on my medicine - XARELTO (rivaroxaban)  This medication education was reviewed with me or my healthcare representative as part of my discharge preparation.   WHY WAS XARELTO PRESCRIBED FOR YOU? Xarelto was prescribed to treat blood clots that may have been found in the veins of your legs (deep vein thrombosis) or in your lungs (pulmonary embolism) and to reduce the risk of them occurring again.  What do you need to know about Xarelto? The starting dose is one 15 mg tablet taken TWICE daily with food for the FIRST 21 DAYS then on February 17th, 2019  the dose is changed to one 20 mg tablet taken ONCE A DAY with your evening meal.  DO NOT stop taking Xarelto without talking to the health care provider who prescribed the medication.  Refill your prescription for 20 mg tablets before you run out.  After discharge, you should have regular check-up appointments with your healthcare provider that is prescribing your Xarelto.  In the future your dose may need to be changed if your kidney function changes by a significant amount.  What do you do if you miss a dose? If you are taking Xarelto TWICE DAILY and you miss a dose, take it as soon as you remember. You may take two 15 mg tablets (total 30 mg) at the same time then resume your regularly scheduled 15 mg twice daily the next day.  If you are taking Xarelto ONCE DAILY and you miss a dose, take it as soon as you remember on the same day then continue your regularly scheduled once daily regimen the next day. Do not take two doses of Xarelto at the same time.   Important Safety Information Xarelto is a blood thinner medicine that can cause bleeding. You should call your healthcare provider right away if you experience any of the following: ? Bleeding from an injury or your nose that does not stop. ? Unusual colored urine (red or dark brown) or unusual colored stools (red or black). ? Unusual bruising for unknown reasons. ? A  serious fall or if you hit your head (even if there is no bleeding).  Some medicines may interact with Xarelto and might increase your risk of bleeding while on Xarelto. To help avoid this, consult your healthcare provider or pharmacist prior to using any new prescription or non-prescription medications, including herbals, vitamins, non-steroidal anti-inflammatory drugs (NSAIDs) and supplements.  This website has more information on Xarelto: [email or web address]

## 2017-03-03 NOTE — Progress Notes (Signed)
PT Cancellation Note  Patient Details Name: April Graves MRN: [id number] DOB: Mar 24, 1955   Cancelled Treatment:    Reason Eval/Treat Not Completed: Patient at procedure or test/unavailable   Will follow up later today as time allows;  Otherwise, will follow up for PT tomorrow;   Thank you,  Roney [place], PT  Acute Rehabilitation Services Pager [phone] Office [phone]     April Graves 03/03/2017, 9:54 AM

## 2017-03-04 ENCOUNTER — Inpatient Hospital Stay (HOSPITAL_COMMUNITY): Payer: Medicare HMO

## 2017-03-04 DIAGNOSIS — Z23 Encounter for immunization: Secondary | ICD-10-CM | POA: Diagnosis not present

## 2017-03-04 DIAGNOSIS — I5030 Unspecified diastolic (congestive) heart failure: Secondary | ICD-10-CM

## 2017-03-04 LAB — CBC
HEMATOCRIT: 35 % — AB (ref 36.0–46.0)
HEMOGLOBIN: 11.4 g/dL — AB (ref 12.0–15.0)
MCH: 27.7 pg (ref 26.0–34.0)
MCHC: 32.6 g/dL (ref 30.0–36.0)
MCV: 85.2 fL (ref 78.0–100.0)
Platelets: 184 10*3/uL (ref 150–400)
RBC: 4.11 MIL/uL (ref 3.87–5.11)
RDW: 14.6 % (ref 11.5–15.5)
WBC: 3.8 10*3/uL — AB (ref 4.0–10.5)

## 2017-03-04 LAB — ECHOCARDIOGRAM COMPLETE
Height: 64 in
Weight: 2627.88 oz

## 2017-03-04 MED ORDER — LIDOCAINE HCL 1 % IJ SOLN
10.0000 mL | Freq: Once | INTRAMUSCULAR | Status: AC
  Administered 2017-03-04: 10 mL
  Filled 2017-03-04: qty 10

## 2017-03-04 MED ORDER — METHYLPREDNISOLONE ACETATE 80 MG/ML IJ SUSP
80.0000 mg | Freq: Once | INTRAMUSCULAR | Status: AC
  Administered 2017-03-04: 80 mg via INTRA_ARTICULAR
  Filled 2017-03-04: qty 1

## 2017-03-04 NOTE — Progress Notes (Addendum)
[facility]   Patient was discharged yesterday, she declined at discharge because her blood pressure was elevated.  Nurse contacted me and I explained that this was because patient was upset and agitated.  Patient reported that she has been previously on amlodipine but not taking it currently because her blood pressure was normal.  BP has improved without any intervention.  Patient continues to request cortisone injection, I explained that patient been on blood thinner is at risk for hemorrhagic joint.  I have contacted orthopedic surgery to evaluate the possibility of cortisone injection to the knees.  After orthopedic evaluation whether she received cortisone shot or not patient is deemed stable for discharge.  See discharge summary for further details  Addendum 2:41PM   Patient had b/l Steroid injections, tolerated well procedure. ECHO was performed and results are pending. Follow up with PCP. [place] for discharge. Home health has been arranged   Chipper Oman, MD    Addendum 5:29 PM  ECHO results  Normal LV size with mild LV hypertrophy. EF 60-65%. Normal RV size and systolic function. No significant valvular abnormalities.Outpatient follow up.   Chipper Oman, MD

## 2017-03-04 NOTE — Progress Notes (Signed)
OT Cancellation Note  Patient Details Name: April Graves MRN: [id number] DOB: November 19, 1955   Cancelled Treatment:    Reason Eval/Treat Not Completed: Other (comment). Pt being set up with [facility]. Will defer OT to [facility] services. If OT eval needed, please page number below. Thanks  [place], OT/L  [phone] 03/04/2017 03/04/2017, 8:39 AM

## 2017-03-04 NOTE — Procedures (Signed)
Procedure: Bilateral knee injection  Indication: Bilateral knee pain  Surgeon: Silvestre Gunner, PA-C  Assist: None  Anesthesia: None  EBL: None  Complications: None  Findings: After risks/benefits explained patient desires to undergo procedure. Consent obtained and time out performed. The bilateral knees were sterilely prepped and injected with 40mg  depomedrol and 57ml 1% plain lidocaine. Pt tolerated the procedure well.    Lisette Abu, PA-C Orthopedic Surgery [phone]

## 2017-03-04 NOTE — Progress Notes (Signed)
Pt without transportation to home. [facility] called per CM and pt's transportation to home arranged, address confirmed with pt prior. Whitman Hero RN,BSN,CM

## 2017-03-04 NOTE — Progress Notes (Signed)
  Echocardiogram 2D Echocardiogram has been performed.  April Graves 03/04/2017, 10:24 AM

## 2017-03-04 NOTE — Progress Notes (Signed)
Mauro Kaufmann to be D/C'd home with home health per MD order. Discussed with the patient and all questions fully answered.  Allergies as of 03/04/2017      Reactions   Codeine Anxiety, Other (See Comments)   Lisinopril Anxiety, Other (See Comments)   Heart Races   Pork-derived Products    Patient reports she does not eat pork       Medication List    STOP taking these medications   amoxicillin-clavulanate 875-125 MG tablet Commonly known as:  AUGMENTIN   doxycycline 100 MG tablet Commonly known as:  VIBRA-TABS   feeding supplement (ENSURE ENLIVE) Liqd   hydrocerin Crea   ondansetron 4 MG tablet Commonly known as:  ZOFRAN     TAKE these medications   acetaminophen 325 MG tablet Commonly known as:  TYLENOL Take 2 tablets (650 mg total) by mouth every 6 (six) hours as needed for mild pain (or Fever >/= 101). What changed:  how much to take   albuterol 108 (90 Base) MCG/ACT inhaler Commonly known as:  PROVENTIL HFA;VENTOLIN HFA Inhale 1-2 puffs into the lungs every 6 (six) hours as needed for wheezing or shortness of breath.   cyclobenzaprine 10 MG tablet Commonly known as:  FLEXERIL Take 5 mg by mouth as needed.   feeding supplement (PRO-STAT SUGAR FREE 64) Liqd Take 30 mLs by mouth 2 (two) times daily.   fluconazole 200 MG tablet Commonly known as:  DIFLUCAN Take 1 tablet (200 mg total) by mouth daily.   LUBRICANT EYE DROPS 0.4-0.3 % Soln Generic drug:  Polyethyl Glycol-Propyl Glycol Place 1 drop into the right eye daily as needed (eye lubricant).   meloxicam 15 MG tablet Commonly known as:  MOBIC Take 15 mg by mouth 2 (two) times daily as needed for pain.   multivitamin with minerals Tabs tablet Take 1 tablet by mouth daily.   oxyCODONE-acetaminophen 5-325 MG tablet Commonly known as:  PERCOCET/ROXICET Take 2 tablets by mouth every 6 (six) hours as needed for moderate pain. What changed:    how much to take  when to take this  reasons to take this   [place] 1 application into the right eye daily as needed (pain). Eye ointment/drop for pain   Rivaroxaban 15 & 20 MG Tbpk Take as directed on package: Start with one 15mg  tablet by mouth twice a day with food. On Day 22, switch to one 20mg  tablet once a day with food.            Durable Medical Equipment  (From admission, onward)        Start     Ordered   03/03/17 1558  For home use only DME wheelchair cushion (seat and back)  (Wheelchairs)  Once     03/03/17 1559      VVS, Skin clean, dry and intact without evidence of skin break down, no evidence of skin tears noted.  IV catheter discontinued intact. Site without signs and symptoms of complications. Dressing and pressure applied.  An After Visit Summary and prescriptions were printed and given to the patient.  Patient escorted via [facility].  Melonie Florida  03/04/2017 7:29 PM

## 2017-03-05 NOTE — Consult Note (Signed)
           [facility] CM Primary Care Navigator  03/05/2017  April Graves [birth year] [id number]   Went to seepatient at the bedsideto identify possible discharge needs but she was alreadydischarged.  Per chart review, patient presented with tachycardia and treated for pulmonary embolism (PE).  Patient was discharged homeyesterday with home health order (refused skilled nursing facility).  Primary care provider's office is listed as providing transition of care (TOC).  Primary care provider's officecalled (spoke with [name]) and confirmed that patient is still under the service of Dr. Aura Dials and to notify of patient's discharge and need for post hospital follow-up and transition of care (TOC).   Made aware to refer patient to [facility] care management if deemed necessary and appropriate for services.    For questions, please contact:  Dannielle Huh, BSN, RN- [facility] Primary Care Navigator  Telephone: [phone] [place]

## 2017-03-13 DIAGNOSIS — G8220 Paraplegia, unspecified: Secondary | ICD-10-CM | POA: Diagnosis not present

## 2017-03-13 DIAGNOSIS — I2699 Other pulmonary embolism without acute cor pulmonale: Secondary | ICD-10-CM | POA: Diagnosis not present

## 2017-03-13 DIAGNOSIS — M19012 Primary osteoarthritis, left shoulder: Secondary | ICD-10-CM | POA: Diagnosis not present

## 2017-03-13 DIAGNOSIS — M19272 Secondary osteoarthritis, left ankle and foot: Secondary | ICD-10-CM | POA: Diagnosis not present

## 2017-03-13 DIAGNOSIS — M19011 Primary osteoarthritis, right shoulder: Secondary | ICD-10-CM | POA: Diagnosis not present

## 2017-03-13 DIAGNOSIS — G894 Chronic pain syndrome: Secondary | ICD-10-CM | POA: Diagnosis not present

## 2017-03-28 DIAGNOSIS — M19011 Primary osteoarthritis, right shoulder: Secondary | ICD-10-CM | POA: Diagnosis not present

## 2017-03-28 DIAGNOSIS — M19012 Primary osteoarthritis, left shoulder: Secondary | ICD-10-CM | POA: Diagnosis not present

## 2017-03-28 DIAGNOSIS — G8220 Paraplegia, unspecified: Secondary | ICD-10-CM | POA: Diagnosis not present

## 2017-03-28 DIAGNOSIS — M19272 Secondary osteoarthritis, left ankle and foot: Secondary | ICD-10-CM | POA: Diagnosis not present

## 2017-03-28 DIAGNOSIS — I2699 Other pulmonary embolism without acute cor pulmonale: Secondary | ICD-10-CM | POA: Diagnosis not present

## 2017-03-28 DIAGNOSIS — G894 Chronic pain syndrome: Secondary | ICD-10-CM | POA: Diagnosis not present

## 2017-03-29 DIAGNOSIS — M19012 Primary osteoarthritis, left shoulder: Secondary | ICD-10-CM | POA: Diagnosis not present

## 2017-03-29 DIAGNOSIS — G8220 Paraplegia, unspecified: Secondary | ICD-10-CM | POA: Diagnosis not present

## 2017-03-29 DIAGNOSIS — I2699 Other pulmonary embolism without acute cor pulmonale: Secondary | ICD-10-CM | POA: Diagnosis not present

## 2017-03-29 DIAGNOSIS — M19011 Primary osteoarthritis, right shoulder: Secondary | ICD-10-CM | POA: Diagnosis not present

## 2017-03-29 DIAGNOSIS — M19272 Secondary osteoarthritis, left ankle and foot: Secondary | ICD-10-CM | POA: Diagnosis not present

## 2017-03-29 DIAGNOSIS — G894 Chronic pain syndrome: Secondary | ICD-10-CM | POA: Diagnosis not present

## 2017-04-01 DIAGNOSIS — G8220 Paraplegia, unspecified: Secondary | ICD-10-CM | POA: Diagnosis not present

## 2017-04-01 DIAGNOSIS — I2699 Other pulmonary embolism without acute cor pulmonale: Secondary | ICD-10-CM | POA: Diagnosis not present

## 2017-04-01 DIAGNOSIS — M19012 Primary osteoarthritis, left shoulder: Secondary | ICD-10-CM | POA: Diagnosis not present

## 2017-04-01 DIAGNOSIS — M19272 Secondary osteoarthritis, left ankle and foot: Secondary | ICD-10-CM | POA: Diagnosis not present

## 2017-04-01 DIAGNOSIS — G894 Chronic pain syndrome: Secondary | ICD-10-CM | POA: Diagnosis not present

## 2017-04-01 DIAGNOSIS — M19011 Primary osteoarthritis, right shoulder: Secondary | ICD-10-CM | POA: Diagnosis not present

## 2017-04-04 DIAGNOSIS — G8220 Paraplegia, unspecified: Secondary | ICD-10-CM | POA: Diagnosis not present

## 2017-04-04 DIAGNOSIS — I2699 Other pulmonary embolism without acute cor pulmonale: Secondary | ICD-10-CM | POA: Diagnosis not present

## 2017-04-04 DIAGNOSIS — G894 Chronic pain syndrome: Secondary | ICD-10-CM | POA: Diagnosis not present

## 2017-04-04 DIAGNOSIS — M19012 Primary osteoarthritis, left shoulder: Secondary | ICD-10-CM | POA: Diagnosis not present

## 2017-04-04 DIAGNOSIS — M19272 Secondary osteoarthritis, left ankle and foot: Secondary | ICD-10-CM | POA: Diagnosis not present

## 2017-04-04 DIAGNOSIS — M19011 Primary osteoarthritis, right shoulder: Secondary | ICD-10-CM | POA: Diagnosis not present

## 2017-04-05 DIAGNOSIS — R532 Functional quadriplegia: Secondary | ICD-10-CM | POA: Diagnosis not present

## 2017-04-05 DIAGNOSIS — M153 Secondary multiple arthritis: Secondary | ICD-10-CM | POA: Diagnosis not present

## 2017-05-10 DIAGNOSIS — M153 Secondary multiple arthritis: Secondary | ICD-10-CM | POA: Diagnosis not present

## 2017-05-10 DIAGNOSIS — R532 Functional quadriplegia: Secondary | ICD-10-CM | POA: Diagnosis not present

## 2017-05-17 DIAGNOSIS — M153 Secondary multiple arthritis: Secondary | ICD-10-CM | POA: Diagnosis not present

## 2017-05-17 DIAGNOSIS — L89004 Pressure ulcer of unspecified elbow, stage 4: Secondary | ICD-10-CM | POA: Diagnosis not present

## 2017-05-17 DIAGNOSIS — R269 Unspecified abnormalities of gait and mobility: Secondary | ICD-10-CM | POA: Diagnosis not present

## 2017-05-17 DIAGNOSIS — S82209A Unspecified fracture of shaft of unspecified tibia, initial encounter for closed fracture: Secondary | ICD-10-CM | POA: Diagnosis not present

## 2017-05-17 DIAGNOSIS — R532 Functional quadriplegia: Secondary | ICD-10-CM | POA: Diagnosis not present

## 2017-05-17 DIAGNOSIS — G8250 Quadriplegia, unspecified: Secondary | ICD-10-CM | POA: Diagnosis not present

## 2017-05-17 DIAGNOSIS — G8929 Other chronic pain: Secondary | ICD-10-CM | POA: Diagnosis not present

## 2017-05-21 DIAGNOSIS — G8250 Quadriplegia, unspecified: Secondary | ICD-10-CM | POA: Diagnosis not present

## 2017-05-21 DIAGNOSIS — R269 Unspecified abnormalities of gait and mobility: Secondary | ICD-10-CM | POA: Diagnosis not present

## 2017-05-21 DIAGNOSIS — R532 Functional quadriplegia: Secondary | ICD-10-CM | POA: Diagnosis not present

## 2017-05-21 DIAGNOSIS — S82209A Unspecified fracture of shaft of unspecified tibia, initial encounter for closed fracture: Secondary | ICD-10-CM | POA: Diagnosis not present

## 2017-05-21 DIAGNOSIS — G8929 Other chronic pain: Secondary | ICD-10-CM | POA: Diagnosis not present

## 2017-05-21 DIAGNOSIS — L89004 Pressure ulcer of unspecified elbow, stage 4: Secondary | ICD-10-CM | POA: Diagnosis not present

## 2017-05-21 DIAGNOSIS — M153 Secondary multiple arthritis: Secondary | ICD-10-CM | POA: Diagnosis not present

## 2017-06-09 DIAGNOSIS — R532 Functional quadriplegia: Secondary | ICD-10-CM | POA: Diagnosis not present

## 2017-06-09 DIAGNOSIS — M153 Secondary multiple arthritis: Secondary | ICD-10-CM | POA: Diagnosis not present

## 2017-06-20 DIAGNOSIS — G8250 Quadriplegia, unspecified: Secondary | ICD-10-CM | POA: Diagnosis not present

## 2017-06-20 DIAGNOSIS — L89004 Pressure ulcer of unspecified elbow, stage 4: Secondary | ICD-10-CM | POA: Diagnosis not present

## 2017-06-20 DIAGNOSIS — R532 Functional quadriplegia: Secondary | ICD-10-CM | POA: Diagnosis not present

## 2017-06-20 DIAGNOSIS — R269 Unspecified abnormalities of gait and mobility: Secondary | ICD-10-CM | POA: Diagnosis not present

## 2017-06-20 DIAGNOSIS — M153 Secondary multiple arthritis: Secondary | ICD-10-CM | POA: Diagnosis not present

## 2017-06-20 DIAGNOSIS — S82209A Unspecified fracture of shaft of unspecified tibia, initial encounter for closed fracture: Secondary | ICD-10-CM | POA: Diagnosis not present

## 2017-06-20 DIAGNOSIS — G8929 Other chronic pain: Secondary | ICD-10-CM | POA: Diagnosis not present

## 2017-07-10 DIAGNOSIS — R532 Functional quadriplegia: Secondary | ICD-10-CM | POA: Diagnosis not present

## 2017-07-10 DIAGNOSIS — M153 Secondary multiple arthritis: Secondary | ICD-10-CM | POA: Diagnosis not present

## 2017-07-21 DIAGNOSIS — G8929 Other chronic pain: Secondary | ICD-10-CM | POA: Diagnosis not present

## 2017-07-21 DIAGNOSIS — G8250 Quadriplegia, unspecified: Secondary | ICD-10-CM | POA: Diagnosis not present

## 2017-07-21 DIAGNOSIS — R532 Functional quadriplegia: Secondary | ICD-10-CM | POA: Diagnosis not present

## 2017-07-21 DIAGNOSIS — L89004 Pressure ulcer of unspecified elbow, stage 4: Secondary | ICD-10-CM | POA: Diagnosis not present

## 2017-07-21 DIAGNOSIS — R269 Unspecified abnormalities of gait and mobility: Secondary | ICD-10-CM | POA: Diagnosis not present

## 2017-07-21 DIAGNOSIS — M153 Secondary multiple arthritis: Secondary | ICD-10-CM | POA: Diagnosis not present

## 2017-07-21 DIAGNOSIS — S82209A Unspecified fracture of shaft of unspecified tibia, initial encounter for closed fracture: Secondary | ICD-10-CM | POA: Diagnosis not present

## 2017-08-09 DIAGNOSIS — R532 Functional quadriplegia: Secondary | ICD-10-CM | POA: Diagnosis not present

## 2017-08-09 DIAGNOSIS — M153 Secondary multiple arthritis: Secondary | ICD-10-CM | POA: Diagnosis not present

## 2017-08-20 DIAGNOSIS — R269 Unspecified abnormalities of gait and mobility: Secondary | ICD-10-CM | POA: Diagnosis not present

## 2017-08-20 DIAGNOSIS — R532 Functional quadriplegia: Secondary | ICD-10-CM | POA: Diagnosis not present

## 2017-08-20 DIAGNOSIS — M153 Secondary multiple arthritis: Secondary | ICD-10-CM | POA: Diagnosis not present

## 2017-08-20 DIAGNOSIS — G8250 Quadriplegia, unspecified: Secondary | ICD-10-CM | POA: Diagnosis not present

## 2017-08-20 DIAGNOSIS — L89004 Pressure ulcer of unspecified elbow, stage 4: Secondary | ICD-10-CM | POA: Diagnosis not present

## 2017-08-20 DIAGNOSIS — G8929 Other chronic pain: Secondary | ICD-10-CM | POA: Diagnosis not present

## 2017-08-20 DIAGNOSIS — S82209A Unspecified fracture of shaft of unspecified tibia, initial encounter for closed fracture: Secondary | ICD-10-CM | POA: Diagnosis not present

## 2017-09-09 DIAGNOSIS — R532 Functional quadriplegia: Secondary | ICD-10-CM | POA: Diagnosis not present

## 2017-09-09 DIAGNOSIS — M153 Secondary multiple arthritis: Secondary | ICD-10-CM | POA: Diagnosis not present

## 2017-09-20 DIAGNOSIS — G8250 Quadriplegia, unspecified: Secondary | ICD-10-CM | POA: Diagnosis not present

## 2017-09-20 DIAGNOSIS — S82209A Unspecified fracture of shaft of unspecified tibia, initial encounter for closed fracture: Secondary | ICD-10-CM | POA: Diagnosis not present

## 2017-09-20 DIAGNOSIS — L89004 Pressure ulcer of unspecified elbow, stage 4: Secondary | ICD-10-CM | POA: Diagnosis not present

## 2017-09-20 DIAGNOSIS — G8929 Other chronic pain: Secondary | ICD-10-CM | POA: Diagnosis not present

## 2017-09-20 DIAGNOSIS — R269 Unspecified abnormalities of gait and mobility: Secondary | ICD-10-CM | POA: Diagnosis not present

## 2017-09-20 DIAGNOSIS — R532 Functional quadriplegia: Secondary | ICD-10-CM | POA: Diagnosis not present

## 2017-09-20 DIAGNOSIS — M153 Secondary multiple arthritis: Secondary | ICD-10-CM | POA: Diagnosis not present

## 2017-09-26 DIAGNOSIS — R000 Tachycardia, unspecified: Secondary | ICD-10-CM | POA: Diagnosis not present

## 2017-09-26 DIAGNOSIS — M79606 Pain in leg, unspecified: Secondary | ICD-10-CM | POA: Diagnosis not present

## 2017-09-26 DIAGNOSIS — M19011 Primary osteoarthritis, right shoulder: Secondary | ICD-10-CM | POA: Diagnosis not present

## 2017-09-26 DIAGNOSIS — R079 Chest pain, unspecified: Secondary | ICD-10-CM | POA: Diagnosis not present

## 2017-09-26 DIAGNOSIS — R9431 Abnormal electrocardiogram [ECG] [EKG]: Secondary | ICD-10-CM | POA: Diagnosis not present

## 2017-09-26 DIAGNOSIS — G8220 Paraplegia, unspecified: Secondary | ICD-10-CM | POA: Diagnosis not present

## 2017-09-26 DIAGNOSIS — M19012 Primary osteoarthritis, left shoulder: Secondary | ICD-10-CM | POA: Diagnosis not present

## 2017-09-26 DIAGNOSIS — R0789 Other chest pain: Secondary | ICD-10-CM | POA: Diagnosis not present

## 2017-09-26 DIAGNOSIS — Z7901 Long term (current) use of anticoagulants: Secondary | ICD-10-CM | POA: Diagnosis not present

## 2017-09-26 DIAGNOSIS — R262 Difficulty in walking, not elsewhere classified: Secondary | ICD-10-CM | POA: Diagnosis not present

## 2017-09-26 DIAGNOSIS — R0902 Hypoxemia: Secondary | ICD-10-CM | POA: Diagnosis not present

## 2017-09-26 DIAGNOSIS — R2243 Localized swelling, mass and lump, lower limb, bilateral: Secondary | ICD-10-CM | POA: Diagnosis not present

## 2017-09-26 DIAGNOSIS — R279 Unspecified lack of coordination: Secondary | ICD-10-CM | POA: Diagnosis not present

## 2017-09-26 DIAGNOSIS — M25569 Pain in unspecified knee: Secondary | ICD-10-CM | POA: Diagnosis not present

## 2017-09-26 DIAGNOSIS — Z743 Need for continuous supervision: Secondary | ICD-10-CM | POA: Diagnosis not present

## 2017-09-26 DIAGNOSIS — Z993 Dependence on wheelchair: Secondary | ICD-10-CM | POA: Diagnosis not present

## 2017-09-26 DIAGNOSIS — R532 Functional quadriplegia: Secondary | ICD-10-CM | POA: Diagnosis not present

## 2017-09-26 DIAGNOSIS — R072 Precordial pain: Secondary | ICD-10-CM | POA: Diagnosis not present

## 2017-09-26 DIAGNOSIS — M170 Bilateral primary osteoarthritis of knee: Secondary | ICD-10-CM | POA: Diagnosis not present

## 2017-09-26 DIAGNOSIS — M623 Immobility syndrome (paraplegic): Secondary | ICD-10-CM | POA: Diagnosis not present

## 2017-09-26 DIAGNOSIS — I2699 Other pulmonary embolism without acute cor pulmonale: Secondary | ICD-10-CM | POA: Diagnosis not present

## 2017-09-26 DIAGNOSIS — M160 Bilateral primary osteoarthritis of hip: Secondary | ICD-10-CM | POA: Diagnosis not present

## 2017-09-26 DIAGNOSIS — T07XXXA Unspecified multiple injuries, initial encounter: Secondary | ICD-10-CM | POA: Diagnosis not present

## 2017-09-27 DIAGNOSIS — R072 Precordial pain: Secondary | ICD-10-CM | POA: Diagnosis not present

## 2017-10-10 DIAGNOSIS — R532 Functional quadriplegia: Secondary | ICD-10-CM | POA: Diagnosis not present

## 2017-10-10 DIAGNOSIS — M153 Secondary multiple arthritis: Secondary | ICD-10-CM | POA: Diagnosis not present

## 2017-10-21 DIAGNOSIS — S82209A Unspecified fracture of shaft of unspecified tibia, initial encounter for closed fracture: Secondary | ICD-10-CM | POA: Diagnosis not present

## 2017-10-21 DIAGNOSIS — M153 Secondary multiple arthritis: Secondary | ICD-10-CM | POA: Diagnosis not present

## 2017-10-21 DIAGNOSIS — G8250 Quadriplegia, unspecified: Secondary | ICD-10-CM | POA: Diagnosis not present

## 2017-10-21 DIAGNOSIS — G8929 Other chronic pain: Secondary | ICD-10-CM | POA: Diagnosis not present

## 2017-10-21 DIAGNOSIS — L89004 Pressure ulcer of unspecified elbow, stage 4: Secondary | ICD-10-CM | POA: Diagnosis not present

## 2017-10-21 DIAGNOSIS — R532 Functional quadriplegia: Secondary | ICD-10-CM | POA: Diagnosis not present

## 2017-10-21 DIAGNOSIS — R269 Unspecified abnormalities of gait and mobility: Secondary | ICD-10-CM | POA: Diagnosis not present

## 2017-11-09 DIAGNOSIS — M153 Secondary multiple arthritis: Secondary | ICD-10-CM | POA: Diagnosis not present

## 2017-11-09 DIAGNOSIS — R532 Functional quadriplegia: Secondary | ICD-10-CM | POA: Diagnosis not present

## 2017-11-13 DIAGNOSIS — Z743 Need for continuous supervision: Secondary | ICD-10-CM | POA: Diagnosis not present

## 2017-11-13 DIAGNOSIS — R279 Unspecified lack of coordination: Secondary | ICD-10-CM | POA: Diagnosis not present

## 2017-11-13 DIAGNOSIS — G8929 Other chronic pain: Secondary | ICD-10-CM | POA: Diagnosis not present

## 2017-11-13 DIAGNOSIS — M170 Bilateral primary osteoarthritis of knee: Secondary | ICD-10-CM | POA: Diagnosis not present

## 2017-11-13 DIAGNOSIS — R5381 Other malaise: Secondary | ICD-10-CM | POA: Diagnosis not present

## 2017-11-20 DIAGNOSIS — M153 Secondary multiple arthritis: Secondary | ICD-10-CM | POA: Diagnosis not present

## 2017-11-20 DIAGNOSIS — R269 Unspecified abnormalities of gait and mobility: Secondary | ICD-10-CM | POA: Diagnosis not present

## 2017-11-20 DIAGNOSIS — G8250 Quadriplegia, unspecified: Secondary | ICD-10-CM | POA: Diagnosis not present

## 2017-11-20 DIAGNOSIS — G8929 Other chronic pain: Secondary | ICD-10-CM | POA: Diagnosis not present

## 2017-11-20 DIAGNOSIS — L89004 Pressure ulcer of unspecified elbow, stage 4: Secondary | ICD-10-CM | POA: Diagnosis not present

## 2017-11-20 DIAGNOSIS — R532 Functional quadriplegia: Secondary | ICD-10-CM | POA: Diagnosis not present

## 2017-11-20 DIAGNOSIS — S82209A Unspecified fracture of shaft of unspecified tibia, initial encounter for closed fracture: Secondary | ICD-10-CM | POA: Diagnosis not present

## 2017-12-10 DIAGNOSIS — R532 Functional quadriplegia: Secondary | ICD-10-CM | POA: Diagnosis not present

## 2017-12-10 DIAGNOSIS — M153 Secondary multiple arthritis: Secondary | ICD-10-CM | POA: Diagnosis not present

## 2017-12-21 DIAGNOSIS — L89004 Pressure ulcer of unspecified elbow, stage 4: Secondary | ICD-10-CM | POA: Diagnosis not present

## 2017-12-21 DIAGNOSIS — S82209A Unspecified fracture of shaft of unspecified tibia, initial encounter for closed fracture: Secondary | ICD-10-CM | POA: Diagnosis not present

## 2017-12-21 DIAGNOSIS — G8929 Other chronic pain: Secondary | ICD-10-CM | POA: Diagnosis not present

## 2017-12-21 DIAGNOSIS — M153 Secondary multiple arthritis: Secondary | ICD-10-CM | POA: Diagnosis not present

## 2017-12-21 DIAGNOSIS — R269 Unspecified abnormalities of gait and mobility: Secondary | ICD-10-CM | POA: Diagnosis not present

## 2017-12-21 DIAGNOSIS — R532 Functional quadriplegia: Secondary | ICD-10-CM | POA: Diagnosis not present

## 2017-12-21 DIAGNOSIS — G8250 Quadriplegia, unspecified: Secondary | ICD-10-CM | POA: Diagnosis not present

## 2018-01-09 DIAGNOSIS — M153 Secondary multiple arthritis: Secondary | ICD-10-CM | POA: Diagnosis not present

## 2018-01-09 DIAGNOSIS — R532 Functional quadriplegia: Secondary | ICD-10-CM | POA: Diagnosis not present

## 2018-01-20 DIAGNOSIS — G8929 Other chronic pain: Secondary | ICD-10-CM | POA: Diagnosis not present

## 2018-01-20 DIAGNOSIS — L89004 Pressure ulcer of unspecified elbow, stage 4: Secondary | ICD-10-CM | POA: Diagnosis not present

## 2018-01-20 DIAGNOSIS — R532 Functional quadriplegia: Secondary | ICD-10-CM | POA: Diagnosis not present

## 2018-01-20 DIAGNOSIS — M153 Secondary multiple arthritis: Secondary | ICD-10-CM | POA: Diagnosis not present

## 2018-01-20 DIAGNOSIS — S82209A Unspecified fracture of shaft of unspecified tibia, initial encounter for closed fracture: Secondary | ICD-10-CM | POA: Diagnosis not present

## 2018-01-20 DIAGNOSIS — G8250 Quadriplegia, unspecified: Secondary | ICD-10-CM | POA: Diagnosis not present

## 2018-01-20 DIAGNOSIS — R269 Unspecified abnormalities of gait and mobility: Secondary | ICD-10-CM | POA: Diagnosis not present

## 2018-02-17 ENCOUNTER — Emergency Department (HOSPITAL_COMMUNITY)
Admission: EM | Admit: 2018-02-17 | Discharge: 2018-02-17 | Disposition: A | Discharge status: Home / Self Care | Payer: Medicare HMO | Attending: Emergency Medicine | Admitting: Emergency Medicine

## 2018-02-17 ENCOUNTER — Encounter (HOSPITAL_COMMUNITY): Payer: Self-pay

## 2018-02-17 ENCOUNTER — Other Ambulatory Visit: Payer: Self-pay

## 2018-02-17 DIAGNOSIS — Z79899 Other long term (current) drug therapy: Secondary | ICD-10-CM | POA: Diagnosis not present

## 2018-02-17 DIAGNOSIS — G8929 Other chronic pain: Secondary | ICD-10-CM | POA: Diagnosis not present

## 2018-02-17 DIAGNOSIS — M25561 Pain in right knee: Secondary | ICD-10-CM | POA: Diagnosis present

## 2018-02-17 MED ORDER — OXYCODONE-ACETAMINOPHEN 5-325 MG PO TABS
1.0000 | ORAL_TABLET | Freq: Once | ORAL | Status: AC
  Administered 2018-02-17: 1 via ORAL
  Filled 2018-02-17: qty 1

## 2018-02-17 NOTE — ED Triage Notes (Signed)
Per EMS: Pt from home.  Pt c/o of R knee pain and bilateral foot pain.  Pt had surgery in both feet, and a L knee replacement.  Pt is bed-bound.

## 2018-02-17 NOTE — Discharge Instructions (Addendum)
Please call your primary care physician and orthopedist for follow up

## 2018-02-23 NOTE — ED Provider Notes (Signed)
[facility] DEPT Provider Note   CSN: [id number] Arrival date & time: 02/17/18  1029     History   Chief Complaint Chief Complaint  Patient presents with  . R knee pain  . Bilateral foot pain    HPI April Graves is a 63 y.o. female.  HPI Patient is a 63 year old female complains of bilateral foot pain and right knee pain.  She denies fevers and chills.  Denies redness.  Denies recent injury or trauma.  She reports an aching throbbing type pain.  She reports that she was seen by an orthopedist and was told that she had arthritis.  She is requesting an injection for her right knee at this time.  She reports her pain is been like this for months.  She denies lower extremity swelling.  Reports no improvement with over-the-counter medications.  Patient does not ambulate regularly.  She is from a facility   Past Medical History:  Diagnosis Date  . Anemia of chronic disease   . Chronic pain syndrome   . Functional quadriplegia ([place])    noted in d/c summary 03/2015  . History of decubitus ulcer    infected, left glut/hip  . History of malnutrition   . Osteoarthritis of both shoulders   . Poor dentition   . Secondary osteoarthritis of left ankle    Post-traumatic; at the tibiotalar joint.  + severe disuse osteopenia.    Patient Active Problem List   Diagnosis Date Noted  . Pulmonary embolism ([place]) 03/01/2017  . Tachycardia 03/01/2017  . Hyponatremia 03/01/2017  . Decubitus ulcer of thigh, stage 4 ([place]) 03/08/2015  . Functional quadriplegia ([place]) 03/07/2015  . Anemia of chronic disease 03/07/2015  . Hypokalemia 10/15/2011    Past Surgical History:  Procedure Laterality Date  . KNEE CLOSED REDUCTION  10/16/2011   No fracture.  Procedure: CLOSED MANIPULATION KNEE;  Surgeon: Mauri Pole, MD;  Location: [facility] ORS;  Service: Orthopedics;;  Proxmial Fibula  . TUBAL LIGATION       OB History   No obstetric history on file.      Home  Medications    Prior to Admission medications   Medication Sig Start Date End Date Taking? Authorizing Provider  acetaminophen (TYLENOL) 325 MG tablet Take 2 tablets (650 mg total) by mouth every 6 (six) hours as needed for mild pain (or Fever >/= 101). Patient taking differently: Take 1,000 mg by mouth every 6 (six) hours as needed for mild pain (or Fever >/= 101).  03/13/15   Robbie Lis, MD  albuterol (PROVENTIL HFA;VENTOLIN HFA) 108 (90 Base) MCG/ACT inhaler Inhale 1-2 puffs into the lungs every 6 (six) hours as needed for wheezing or shortness of breath.    [provider]  Amino Acids-Protein Hydrolys (FEEDING SUPPLEMENT, PRO-STAT SUGAR FREE 64,) LIQD Take 30 mLs by mouth 2 (two) times daily. Patient not taking: Reported on 03/01/2017 03/13/15   Robbie Lis, MD  cyclobenzaprine (FLEXERIL) 10 MG tablet Take 5 mg by mouth as needed. 08/17/14   [provider]  fluconazole (DIFLUCAN) 200 MG tablet Take 1 tablet (200 mg total) by mouth daily. 03/04/17   Doreatha Lew, MD  LUBRICANT EYE DROPS 0.4-0.3 % SOLN Place 1 drop into the right eye daily as needed (eye lubricant).  02/21/17   [provider]  meloxicam (MOBIC) 15 MG tablet Take 15 mg by mouth 2 (two) times daily as needed for pain.    [provider]  Multiple Vitamin (MULTIVITAMIN WITH MINERALS) TABS tablet Take 1 tablet by mouth daily.    [provider]  oxyCODONE-acetaminophen (PERCOCET/ROXICET) 5-325 MG tablet Take 2 tablets by mouth every 6 (six) hours as needed for moderate pain. 03/03/17   Doreatha Lew, MD  PRESCRIPTION MEDICATION Place 1 application into the right eye daily as needed (pain). Eye ointment/drop for pain    [provider]  Rivaroxaban 15 & 20 MG TBPK Take as directed on package: Start with one 15mg  tablet by mouth twice a day with food. On Day 22, switch to one 20mg  tablet once a day with food. 03/03/17   Doreatha Lew, MD    Family History Family  History  Problem Relation Age of Onset  . Hypertension Other     Social History Social History   Tobacco Use  . Smoking status: Never Smoker  . Smokeless tobacco: Never Used  Substance Use Topics  . Alcohol use: No  . Drug use: No     Allergies   Codeine; Lisinopril; and Pork-derived products   Review of Systems Review of Systems  All other systems reviewed and are negative.    Physical Exam Updated Vital Signs BP 138/89   Pulse (!) 106   Temp 97.7 F (36.5 C) (Oral)   Resp 16   Ht 5\' 4"  (1.626 m)   Wt 63.5 kg   SpO2 98%   BMI 24.03 kg/m   Physical Exam Vitals signs and nursing note reviewed.  Constitutional:      Appearance: She is well-developed.  HENT:     Head: Normocephalic.  Neck:     Musculoskeletal: Normal range of motion.  Cardiovascular:     Rate and Rhythm: Regular rhythm. Tachycardia present.  Pulmonary:     Effort: Pulmonary effort is normal.  Abdominal:     General: There is no distension.  Musculoskeletal: Normal range of motion.     Comments: Normal PT and DP pulses bilaterally.  No swelling of her lower extremities.  Full range of motion of bilateral ankles, knees, hips.  No warmth or erythema of her right knee.  Able to range her right knee with only minimal discomfort.  Full range of motion of bilateral ankles.  Neurological:     Mental Status: She is alert and oriented to person, place, and time.      ED Treatments / Results  Labs (all labs ordered are listed, but only abnormal results are displayed) Labs Reviewed - No data to display  EKG None  Radiology No results found.  Procedures Procedures (including critical care time)  Medications Ordered in ED Medications  oxyCODONE-acetaminophen (PERCOCET/ROXICET) 5-325 MG per tablet 1 tablet (1 tablet Oral Given 02/17/18 1139)     Initial Impression / Assessment and Plan / ED Course  I have reviewed the triage vital signs and the nursing notes.  Pertinent labs & imaging  results that were available during my care of the patient were reviewed by me and considered in my medical decision making (see chart for details).     This seems to be more of a chronic issue.  No life-threatening emergency at this time.  No signs to suggest septic arthritis or cellulitis.  Doubt DVT.  Normal arterial flow.  Close primary care follow-up and follow-up with her orthopedist.  Final Clinical Impressions(s) / ED Diagnoses   Final diagnoses:  Chronic pain of right knee    ED Discharge Orders    None  Jola Schmidt, MD 02/23/18 [phone]

## 2018-03-06 ENCOUNTER — Other Ambulatory Visit: Payer: Self-pay

## 2018-03-06 NOTE — Patient Outreach (Signed)
[facility] [facility]) Care Management  03/06/2018  April Graves 10/26/1955 [id number]   Referral Date: 03/06/2018 Referral Source: [facility] Referral Reason: (On referral-no information) Per patient she needs help around the house.     Outreach Attempt: spoke with patient.  Discussed referral. Patient states that she is low income and states that she was told by [facility] she qualifies for help in the home. Patient not sure if she has medicaid or not.     Patient admits to being quadriplegic and can barely get around.  She also admits to history on Pulmonary Embolism for which she takes Eliquis. Patient also states that she she has chronic pain is being seen by orthopedist and takes tylenol as needed.  Patient states she has her children in and out but really needs someone to assist with her daily needs.   Discussed with patient [facility] services.  Patient is agreeable to social work support to assist with getting help in the home.   Plan: RN CM will refer to social work for help in the home.     Jone Baseman, RN, MSN [facility] Care Management Care Management Coordinator Direct Line [phone] Toll Free: [phone]  Fax: [phone]

## 2018-03-11 ENCOUNTER — Other Ambulatory Visit: Payer: Self-pay

## 2018-03-11 NOTE — Patient Outreach (Signed)
[place] [facility]) Care Management  03/11/2018  April Graves 08/21/1955 [id number]   Successful outreach to [name] regarding social work referral for "help in the home."  BSW inquired about what type of assistance [name] is needing and informed her of services that are typically provided by in-home agencies. She reported that she already has an individual that comes into the home to provide assistance.  [name] mentioned several times that she would like to go to a nursing facility for a short period of time so that her caregiver can "go on vacation" She said that she received confirmation from [facility] that this stay would be covered.   BSW encouraged her to discuss this further with her MD and insurance provider. BSW informed her that we cannot quote benefit coverage.  [name] stated several times during our conversation that she is not in need of additional assistance in the home.  No other social work needs were identified.  BSW is closing case at this time.  Ronn Melena, BSW Social Worker [phone]

## 2020-10-18 DIAGNOSIS — M25561 Pain in right knee: Secondary | ICD-10-CM | POA: Diagnosis not present

## 2020-10-18 DIAGNOSIS — R000 Tachycardia, unspecified: Secondary | ICD-10-CM | POA: Diagnosis not present

## 2020-10-18 DIAGNOSIS — M25562 Pain in left knee: Secondary | ICD-10-CM | POA: Diagnosis not present

## 2020-10-18 DIAGNOSIS — N6320 Unspecified lump in the left breast, unspecified quadrant: Secondary | ICD-10-CM | POA: Diagnosis not present

## 2020-10-18 DIAGNOSIS — G8929 Other chronic pain: Secondary | ICD-10-CM | POA: Diagnosis not present

## 2020-10-18 DIAGNOSIS — R609 Edema, unspecified: Secondary | ICD-10-CM | POA: Diagnosis not present

## 2020-10-18 DIAGNOSIS — I517 Cardiomegaly: Secondary | ICD-10-CM | POA: Diagnosis not present

## 2020-10-19 DIAGNOSIS — R000 Tachycardia, unspecified: Secondary | ICD-10-CM | POA: Diagnosis not present

## 2021-05-01 DIAGNOSIS — I10 Essential (primary) hypertension: Secondary | ICD-10-CM | POA: Diagnosis not present

## 2021-05-01 DIAGNOSIS — R410 Disorientation, unspecified: Secondary | ICD-10-CM | POA: Diagnosis not present

## 2021-05-01 DIAGNOSIS — R059 Cough, unspecified: Secondary | ICD-10-CM | POA: Diagnosis not present

## 2021-05-01 DIAGNOSIS — R000 Tachycardia, unspecified: Secondary | ICD-10-CM | POA: Diagnosis not present

## 2021-05-02 ENCOUNTER — Other Ambulatory Visit: Payer: Self-pay

## 2021-05-02 ENCOUNTER — Emergency Department (HOSPITAL_COMMUNITY): Payer: Medicare Other

## 2021-05-02 ENCOUNTER — Emergency Department (HOSPITAL_COMMUNITY)
Admission: EM | Admit: 2021-05-02 | Discharge: 2021-05-02 | Disposition: A | Discharge status: Home / Self Care | Payer: Medicare Other | Attending: Emergency Medicine | Admitting: Emergency Medicine

## 2021-05-02 ENCOUNTER — Encounter (HOSPITAL_COMMUNITY): Payer: Self-pay

## 2021-05-02 DIAGNOSIS — N3000 Acute cystitis without hematuria: Secondary | ICD-10-CM | POA: Diagnosis not present

## 2021-05-02 DIAGNOSIS — R0989 Other specified symptoms and signs involving the circulatory and respiratory systems: Secondary | ICD-10-CM | POA: Diagnosis not present

## 2021-05-02 DIAGNOSIS — H1033 Unspecified acute conjunctivitis, bilateral: Secondary | ICD-10-CM | POA: Diagnosis not present

## 2021-05-02 DIAGNOSIS — R4182 Altered mental status, unspecified: Secondary | ICD-10-CM | POA: Diagnosis not present

## 2021-05-02 DIAGNOSIS — I517 Cardiomegaly: Secondary | ICD-10-CM | POA: Diagnosis not present

## 2021-05-02 DIAGNOSIS — R000 Tachycardia, unspecified: Secondary | ICD-10-CM | POA: Diagnosis not present

## 2021-05-02 LAB — URINALYSIS, ROUTINE W REFLEX MICROSCOPIC
Bilirubin Urine: NEGATIVE
Glucose, UA: NEGATIVE mg/dL
Ketones, ur: NEGATIVE mg/dL
Nitrite: NEGATIVE
Protein, ur: NEGATIVE mg/dL
Specific Gravity, Urine: 1.012 (ref 1.005–1.030)
pH: 5 (ref 5.0–8.0)

## 2021-05-02 LAB — COMPREHENSIVE METABOLIC PANEL
ALT: 14 U/L (ref 0–44)
AST: 25 U/L (ref 15–41)
Albumin: 3.4 g/dL — ABNORMAL LOW (ref 3.5–5.0)
Alkaline Phosphatase: 329 U/L — ABNORMAL HIGH (ref 38–126)
Anion gap: 9 (ref 5–15)
BUN: 9 mg/dL (ref 8–23)
CO2: 23 mmol/L (ref 22–32)
Calcium: 10.2 mg/dL (ref 8.9–10.3)
Chloride: 104 mmol/L (ref 98–111)
Creatinine, Ser: 0.55 mg/dL (ref 0.44–1.00)
GFR, Estimated: >60 mL/min (ref >60)
Glucose, Bld: 129 mg/dL — ABNORMAL HIGH (ref 70–99)
Potassium: 3.9 mmol/L (ref 3.5–5.1)
Sodium: 136 mmol/L (ref 135–145)
Total Bilirubin: 1.5 mg/dL — ABNORMAL HIGH (ref 0.3–1.2)
Total Protein: 8.6 g/dL — ABNORMAL HIGH (ref 6.5–8.1)

## 2021-05-02 LAB — CBC WITH DIFFERENTIAL/PLATELET
Abs Immature Granulocytes: 0.01 10*3/uL (ref 0.00–0.07)
Basophils Absolute: 0 10*3/uL (ref 0.0–0.1)
Basophils Relative: 1 %
Eosinophils Absolute: 0 10*3/uL (ref 0.0–0.5)
Eosinophils Relative: 1 %
HCT: 44.6 % (ref 36.0–46.0)
Hemoglobin: 14.4 g/dL (ref 12.0–15.0)
Immature Granulocytes: 0 %
Lymphocytes Relative: 31 %
Lymphs Abs: 1.6 10*3/uL (ref 0.7–4.0)
MCH: 27.5 pg (ref 26.0–34.0)
MCHC: 32.3 g/dL (ref 30.0–36.0)
MCV: 85.1 fL (ref 80.0–100.0)
Monocytes Absolute: 0.6 10*3/uL (ref 0.1–1.0)
Monocytes Relative: 11 %
Neutro Abs: 3 10*3/uL (ref 1.7–7.7)
Neutrophils Relative %: 56 %
Platelets: 243 10*3/uL (ref 150–400)
RBC: 5.24 MIL/uL — ABNORMAL HIGH (ref 3.87–5.11)
RDW: 13.4 % (ref 11.5–15.5)
WBC: 5.2 10*3/uL (ref 4.0–10.5)
nRBC: 0 % (ref 0.0–0.2)

## 2021-05-02 MED ORDER — LACTATED RINGERS IV BOLUS
1000.0000 mL | Freq: Once | INTRAVENOUS | Status: AC
  Administered 2021-05-02: 1000 mL via INTRAVENOUS

## 2021-05-02 MED ORDER — ERYTHROMYCIN 5 MG/GM OP OINT: TOPICAL_OINTMENT | Freq: Four times a day (QID) | OPHTHALMIC | 0 refills | Status: DC

## 2021-05-02 MED ORDER — CEPHALEXIN 500 MG PO CAPS: 500.0000 mg | ORAL_CAPSULE | Freq: Two times a day (BID) | ORAL | 0 refills | Status: AC

## 2021-05-02 NOTE — ED Provider Notes (Signed)
? ?[facility]  ?Provider Note ? ?CSN: [id number] ?Arrival date & time: 05/02/21 0023 ? ?History ?Chief Complaint  ?Patient presents with  ? Altered Mental Status  ? ? ?April Graves is a 66 y.o. female with history of functional quadriplegia lives at home with daughter who is at bedside with patient's son. She has been in her usual state of health recently but began having URI symptoms recently. Today she has also developed redness and drainage from her eyes. She took some Nyquil earlier this evening and then became confused a short time later asking about a lion or alligator in the garage. She is usually 'pretty sharp' per the son. She is still confused some and having difficulty fully explaining her symptoms. EMS reported a strong urine smell on their arrival. No known fevers.  ? ? ?Home Medications ?Prior to Admission medications   ?Medication Sig Start Date End Date Taking? Authorizing Provider  ?cephALEXin (KEFLEX) 500 MG capsule Take 1 capsule (500 mg total) by mouth 2 (two) times daily for 7 days. 05/02/21 05/09/21 Yes Truddie Hidden, MD  ?erythromycin ophthalmic ointment Place into both eyes 4 (four) times daily. Place a 1/2 inch ribbon of ointment into the lower eyelid. 05/02/21  Yes Truddie Hidden, MD  ?acetaminophen (TYLENOL) 325 MG tablet Take 2 tablets (650 mg total) by mouth every 6 (six) hours as needed for mild pain (or Fever >/= 101). ?Patient taking differently: Take 1,000 mg by mouth every 6 (six) hours as needed for mild pain (or Fever >/= 101).  03/13/15   Robbie Lis, MD  ?albuterol (PROVENTIL HFA;VENTOLIN HFA) 108 (90 Base) MCG/ACT inhaler Inhale 1-2 puffs into the lungs every 6 (six) hours as needed for wheezing or shortness of breath.    [provider]  ?Amino Acids-Protein Hydrolys (FEEDING SUPPLEMENT, PRO-STAT SUGAR FREE 64,) LIQD Take 30 mLs by mouth 2 (two) times daily. ?Patient not taking: Reported on 03/01/2017 03/13/15   Robbie Lis, MD  ?cyclobenzaprine (FLEXERIL) 10 MG tablet Take 5 mg by mouth as needed. 08/17/14   [provider]  ?fluconazole (DIFLUCAN) 200 MG tablet Take 1 tablet (200 mg total) by mouth daily. 03/04/17   Doreatha Lew, MD  ?LUBRICANT EYE DROPS 0.4-0.3 % SOLN Place 1 drop into the right eye daily as needed (eye lubricant).  02/21/17   [provider]  ?meloxicam (MOBIC) 15 MG tablet Take 15 mg by mouth 2 (two) times daily as needed for pain.    [provider]  ?Multiple Vitamin (MULTIVITAMIN WITH MINERALS) TABS tablet Take 1 tablet by mouth daily.    [provider]  ?oxyCODONE-acetaminophen (PERCOCET/ROXICET) 5-325 MG tablet Take 2 tablets by mouth every 6 (six) hours as needed for moderate pain. 03/03/17   Doreatha Lew, MD  ?[place] 1 application into the right eye daily as needed (pain). Eye ointment/drop for pain    [provider]  ?Rivaroxaban 15 & 20 MG TBPK Take as directed on package: Start with one '15mg'$  tablet by mouth twice a day with food. On Day 22, switch to one '20mg'$  tablet once a day with food. 03/03/17   Doreatha Lew, MD  ? ? ? ?Allergies    ?Codeine, Lisinopril, and Pork-derived products ? ? ?Review of Systems   ?Review of Systems ?Please see HPI for pertinent positives and negatives ? ?Physical Exam ?BP (!) 140/98   Pulse 96   Temp 99.7 ?F (37.6 ?C) (  Oral)   Resp (!) 21   SpO2 95%  ? ?Physical Exam ?Vitals and nursing note reviewed.  ?Constitutional:   ?   Appearance: Normal appearance.  ?HENT:  ?   Head: Normocephalic and atraumatic.  ?   Nose: Nose normal.  ?   Mouth/Throat:  ?   Mouth: Mucous membranes are dry.  ?Eyes:  ?   General:     ?   Right eye: Discharge present.     ?   Left eye: Discharge present. ?   Extraocular Movements: Extraocular movements intact.  ?   Comments: L>R conjunctival injection  ?Cardiovascular:  ?   Rate and Rhythm: Tachycardia present.  ?Pulmonary:  ?   Effort: Pulmonary effort is  normal.  ?   Breath sounds: Normal breath sounds.  ?Abdominal:  ?   General: Abdomen is flat.  ?   Palpations: Abdomen is soft.  ?   Tenderness: There is no abdominal tenderness.  ?Musculoskeletal:  ?   Cervical back: Neck supple.  ?   Comments: Contractures of extremities is chronic  ?Skin: ?   General: Skin is warm and dry.  ?Neurological:  ?   Mental Status: She is alert.  ?   Comments: Alert, tangential but oriented  ?Psychiatric:     ?   Mood and Affect: Mood normal.  ? ? ?ED Results / Procedures / Treatments   ?EKG ?EKG Interpretation ? ?Date/Time:  Tuesday May 02 2021 00:23:25 EDT ?Ventricular Rate:  122 ?PR Interval:  153 ?QRS Duration: 83 ?QT Interval:  309 ?QTC Calculation: 441 ?R Axis:   16 ?Text Interpretation: Sinus tachycardia Atrial premature complex Nonspecific T abnormalities, lateral leads No significant change since last tracing Confirmed by Calvert Cantor [phone]) on 05/02/2021 12:26:50 AM ? ?Procedures ?Procedures ? ?Medications Ordered in the ED ?Medications  ?lactated ringers bolus 1,000 mL (0 mLs Intravenous Stopped 05/02/21 0326)  ? ? ?Initial Impression and Plan ? Symptoms may be a side effect of Nyquil use but will check labs and CT head to rule out more serious cause. IVF for signs of dehydration and tachycardia but her baseline HR is elevated.  ? ?ED Course  ? ?Clinical Course as of 05/02/21 0427  ?Tue May 02, 2021  ?0142 CBC is normal.  [CS]  ?0203 CMP is unremarkable. ALP is at baseline.  [CS]  ?0204 I personally viewed the images from radiology studies and agree with radiologist interpretation: CXR is clear, CT head without acute findings but there is bony lesion. Had breast mass on previous ED visit in Sept 2022, unclear if this has been addressed in the meantime.  ? [CS]  ?0204 HR is improving.  [CS]  ?0424 UA with some signs of infection. Patient reports some recently dysuria. Will treat with Keflex. She is otherwise back to baseline and wants to go home. Daughter at bedside is  amenable. Also will treat for conjunctivitis. Patient is asking about her breast mass, advised to follow up at PCP and[facility]or [facility] for further management.  [CS]  ?  ?Clinical Course User Index ?[CS] Truddie Hidden, MD  ? ? ? ?MDM Rules/Calculators/A&P ?Medical Decision Making ?Given presenting complaint, I considered that admission might be necessary. After review of results from ED lab and/or imaging studies, admission to the hospital is not indicated at this time.  ? ? ?Amount and/or Complexity of Data Reviewed ?Labs: ordered. Decision-making details documented in ED Course. ?Radiology: ordered and independent interpretation performed. Decision-making details documented in ED Course. ?  ECG/medicine tests: ordered and independent interpretation performed. Decision-making details documented in ED Course. ? ?Risk ?Prescription drug management. ?Decision regarding hospitalization. ? ? ? ?Final Clinical Impression(s) / ED Diagnoses ?Final diagnoses:  ?Altered mental status, unspecified altered mental status type  ?Acute bacterial conjunctivitis of both eyes  ?Acute cystitis without hematuria  ? ? ?Rx / DC Orders ?ED Discharge Orders   ? ?      Ordered  ?  erythromycin ophthalmic ointment  4 times daily       ? 05/02/21 0410  ?  cephALEXin (KEFLEX) 500 MG capsule  2 times daily       ? 05/02/21 0425  ? ?  ?  ? ?  ? ?  ?Truddie Hidden, MD ?05/02/21 [phone] ? ?

## 2021-05-02 NOTE — ED Triage Notes (Signed)
Pt arrives to ED BIB [facility] for AMS. Per EMS pt too Nyquil at 2200 and at 2300 pt altered and stated "Go tell the cowboy to get the lion out of the garage". Per EMS in route pt less altered and upon arrival to ED A/O x4.  ?

## 2021-07-01 ENCOUNTER — Inpatient Hospital Stay (HOSPITAL_COMMUNITY)
Admission: EM | Admit: 2021-07-01 | Discharge: 2021-07-03 | DRG: 064 | Disposition: A | Discharge status: Home / Self Care | Payer: Medicare Other | Attending: Family Medicine | Admitting: Family Medicine

## 2021-07-01 ENCOUNTER — Emergency Department (HOSPITAL_COMMUNITY): Payer: Medicare Other

## 2021-07-01 ENCOUNTER — Other Ambulatory Visit: Payer: Self-pay

## 2021-07-01 DIAGNOSIS — N390 Urinary tract infection, site not specified: Secondary | ICD-10-CM | POA: Diagnosis present

## 2021-07-01 DIAGNOSIS — I63511 Cerebral infarction due to unspecified occlusion or stenosis of right middle cerebral artery: Secondary | ICD-10-CM | POA: Diagnosis not present

## 2021-07-01 DIAGNOSIS — M47812 Spondylosis without myelopathy or radiculopathy, cervical region: Secondary | ICD-10-CM | POA: Diagnosis not present

## 2021-07-01 DIAGNOSIS — Z7401 Bed confinement status: Secondary | ICD-10-CM | POA: Diagnosis not present

## 2021-07-01 DIAGNOSIS — G9349 Other encephalopathy: Secondary | ICD-10-CM | POA: Diagnosis present

## 2021-07-01 DIAGNOSIS — Z86711 Personal history of pulmonary embolism: Secondary | ICD-10-CM

## 2021-07-01 DIAGNOSIS — G4700 Insomnia, unspecified: Secondary | ICD-10-CM | POA: Diagnosis not present

## 2021-07-01 DIAGNOSIS — Z91014 Allergy to mammalian meats: Secondary | ICD-10-CM | POA: Diagnosis not present

## 2021-07-01 DIAGNOSIS — E860 Dehydration: Secondary | ICD-10-CM | POA: Diagnosis not present

## 2021-07-01 DIAGNOSIS — Z7982 Long term (current) use of aspirin: Secondary | ICD-10-CM | POA: Diagnosis not present

## 2021-07-01 DIAGNOSIS — F060 Psychotic disorder with hallucinations due to known physiological condition: Secondary | ICD-10-CM | POA: Diagnosis not present

## 2021-07-01 DIAGNOSIS — D1802 Hemangioma of intracranial structures: Secondary | ICD-10-CM | POA: Diagnosis not present

## 2021-07-01 DIAGNOSIS — C799 Secondary malignant neoplasm of unspecified site: Secondary | ICD-10-CM | POA: Diagnosis not present

## 2021-07-01 DIAGNOSIS — Z885 Allergy status to narcotic agent status: Secondary | ICD-10-CM | POA: Diagnosis not present

## 2021-07-01 DIAGNOSIS — Z888 Allergy status to other drugs, medicaments and biological substances status: Secondary | ICD-10-CM | POA: Diagnosis not present

## 2021-07-01 DIAGNOSIS — R000 Tachycardia, unspecified: Secondary | ICD-10-CM

## 2021-07-01 DIAGNOSIS — I10 Essential (primary) hypertension: Secondary | ICD-10-CM | POA: Diagnosis not present

## 2021-07-01 DIAGNOSIS — M19072 Primary osteoarthritis, left ankle and foot: Secondary | ICD-10-CM | POA: Diagnosis not present

## 2021-07-01 DIAGNOSIS — G894 Chronic pain syndrome: Secondary | ICD-10-CM | POA: Diagnosis not present

## 2021-07-01 DIAGNOSIS — R532 Functional quadriplegia: Secondary | ICD-10-CM | POA: Diagnosis present

## 2021-07-01 DIAGNOSIS — R443 Hallucinations, unspecified: Secondary | ICD-10-CM | POA: Diagnosis not present

## 2021-07-01 DIAGNOSIS — Z20822 Contact with and (suspected) exposure to covid-19: Secondary | ICD-10-CM | POA: Diagnosis present

## 2021-07-01 DIAGNOSIS — I6523 Occlusion and stenosis of bilateral carotid arteries: Secondary | ICD-10-CM | POA: Diagnosis not present

## 2021-07-01 DIAGNOSIS — M19012 Primary osteoarthritis, left shoulder: Secondary | ICD-10-CM | POA: Diagnosis present

## 2021-07-01 DIAGNOSIS — Z79899 Other long term (current) drug therapy: Secondary | ICD-10-CM | POA: Diagnosis not present

## 2021-07-01 DIAGNOSIS — I639 Cerebral infarction, unspecified: Secondary | ICD-10-CM

## 2021-07-01 DIAGNOSIS — C50912 Malignant neoplasm of unspecified site of left female breast: Secondary | ICD-10-CM | POA: Diagnosis present

## 2021-07-01 DIAGNOSIS — N6320 Unspecified lump in the left breast, unspecified quadrant: Secondary | ICD-10-CM | POA: Diagnosis not present

## 2021-07-01 DIAGNOSIS — M19011 Primary osteoarthritis, right shoulder: Secondary | ICD-10-CM | POA: Diagnosis not present

## 2021-07-01 DIAGNOSIS — R4182 Altered mental status, unspecified: Secondary | ICD-10-CM | POA: Diagnosis present

## 2021-07-01 DIAGNOSIS — F29 Unspecified psychosis not due to a substance or known physiological condition: Secondary | ICD-10-CM | POA: Diagnosis present

## 2021-07-01 DIAGNOSIS — R6510 Systemic inflammatory response syndrome (SIRS) of non-infectious origin without acute organ dysfunction: Secondary | ICD-10-CM | POA: Diagnosis not present

## 2021-07-01 DIAGNOSIS — N63 Unspecified lump in unspecified breast: Secondary | ICD-10-CM

## 2021-07-01 DIAGNOSIS — I672 Cerebral atherosclerosis: Secondary | ICD-10-CM | POA: Diagnosis not present

## 2021-07-01 DIAGNOSIS — Z993 Dependence on wheelchair: Secondary | ICD-10-CM | POA: Diagnosis not present

## 2021-07-01 DIAGNOSIS — I2699 Other pulmonary embolism without acute cor pulmonale: Secondary | ICD-10-CM | POA: Diagnosis present

## 2021-07-01 DIAGNOSIS — I6389 Other cerebral infarction: Secondary | ICD-10-CM | POA: Diagnosis not present

## 2021-07-01 DIAGNOSIS — J9811 Atelectasis: Secondary | ICD-10-CM | POA: Diagnosis not present

## 2021-07-01 LAB — URINALYSIS, ROUTINE W REFLEX MICROSCOPIC
Bacteria, UA: NONE SEEN
Bilirubin Urine: NEGATIVE
Glucose, UA: NEGATIVE mg/dL
Ketones, ur: 20 mg/dL — AB
Nitrite: NEGATIVE
Protein, ur: 30 mg/dL — AB
Specific Gravity, Urine: 1.023 (ref 1.005–1.030)
pH: 5 (ref 5.0–8.0)

## 2021-07-01 LAB — CBC WITH DIFFERENTIAL/PLATELET
Abs Immature Granulocytes: 0.02 10*3/uL (ref 0.00–0.07)
Basophils Absolute: 0 10*3/uL (ref 0.0–0.1)
Basophils Relative: 0 %
Eosinophils Absolute: 0 10*3/uL (ref 0.0–0.5)
Eosinophils Relative: 0 %
HCT: 43.1 % (ref 36.0–46.0)
Hemoglobin: 14.1 g/dL (ref 12.0–15.0)
Immature Granulocytes: 0 %
Lymphocytes Relative: 17 %
Lymphs Abs: 1 10*3/uL (ref 0.7–4.0)
MCH: 27.6 pg (ref 26.0–34.0)
MCHC: 32.7 g/dL (ref 30.0–36.0)
MCV: 84.5 fL (ref 80.0–100.0)
Monocytes Absolute: 0.4 10*3/uL (ref 0.1–1.0)
Monocytes Relative: 6 %
Neutro Abs: 4.2 10*3/uL (ref 1.7–7.7)
Neutrophils Relative %: 77 %
Platelets: 349 10*3/uL (ref 150–400)
RBC: 5.1 MIL/uL (ref 3.87–5.11)
RDW: 12.8 % (ref 11.5–15.5)
WBC: 5.6 10*3/uL (ref 4.0–10.5)
nRBC: 0 % (ref 0.0–0.2)

## 2021-07-01 LAB — COMPREHENSIVE METABOLIC PANEL
ALT: 11 U/L (ref 0–44)
AST: 33 U/L (ref 15–41)
Albumin: 3.3 g/dL — ABNORMAL LOW (ref 3.5–5.0)
Alkaline Phosphatase: 241 U/L — ABNORMAL HIGH (ref 38–126)
Anion gap: 9 (ref 5–15)
BUN: 9 mg/dL (ref 8–23)
CO2: 22 mmol/L (ref 22–32)
Calcium: 9.9 mg/dL (ref 8.9–10.3)
Chloride: 105 mmol/L (ref 98–111)
Creatinine, Ser: 0.41 mg/dL — ABNORMAL LOW (ref 0.44–1.00)
GFR, Estimated: >60 mL/min (ref >60)
Glucose, Bld: 114 mg/dL — ABNORMAL HIGH (ref 70–99)
Potassium: 3.8 mmol/L (ref 3.5–5.1)
Sodium: 136 mmol/L (ref 135–145)
Total Bilirubin: 0.8 mg/dL (ref 0.3–1.2)
Total Protein: 8.2 g/dL — ABNORMAL HIGH (ref 6.5–8.1)

## 2021-07-01 LAB — PROTIME-INR
INR: 1.2 (ref 0.8–1.2)
Prothrombin Time: 15 seconds (ref 11.4–15.2)

## 2021-07-01 LAB — LACTIC ACID, PLASMA: Lactic Acid, Venous: 1.1 mmol/L (ref 0.5–1.9)

## 2021-07-01 LAB — SARS CORONAVIRUS 2 BY RT PCR: SARS Coronavirus 2 by RT PCR: NEGATIVE

## 2021-07-01 LAB — MAGNESIUM: Magnesium: 1.8 mg/dL (ref 1.7–2.4)

## 2021-07-01 MED ORDER — ACETAMINOPHEN 325 MG PO TABS
650.0000 mg | ORAL_TABLET | Freq: Once | ORAL | Status: AC
Start: 2021-07-01 — End: 2021-07-01
  Administered 2021-07-01: 650 mg via ORAL
  Filled 2021-07-01: qty 2

## 2021-07-01 MED ORDER — HALOPERIDOL LACTATE 5 MG/ML IJ SOLN
2.5000 mg | Freq: Once | INTRAMUSCULAR | Status: AC
Start: 2021-07-01 — End: 2021-07-01
  Administered 2021-07-01: 2.5 mg via INTRAVENOUS
  Filled 2021-07-01: qty 1

## 2021-07-01 MED ORDER — LORAZEPAM 2 MG/ML IJ SOLN: 1.0000 mg | Freq: Once | INTRAMUSCULAR | Status: DC

## 2021-07-01 MED ORDER — GADOBUTROL 1 MMOL/ML IV SOLN
6.3000 mL | Freq: Once | INTRAVENOUS | Status: AC | PRN
  Administered 2021-07-01: 6.3 mL via INTRAVENOUS

## 2021-07-01 MED ORDER — IOHEXOL 350 MG/ML SOLN
49.0000 mL | Freq: Once | INTRAVENOUS | Status: AC | PRN
  Administered 2021-07-01: 49 mL via INTRAVENOUS

## 2021-07-01 MED ORDER — LACTATED RINGERS IV BOLUS (SEPSIS)
1000.0000 mL | Freq: Once | INTRAVENOUS | Status: AC
  Administered 2021-07-01: 1000 mL via INTRAVENOUS

## 2021-07-01 NOTE — ED Notes (Signed)
Patient transported to MRI 

## 2021-07-01 NOTE — ED Notes (Signed)
Consuelo Pandy, patient's son left callback information prior to leaving patient's room. Phone number [phone].

## 2021-07-01 NOTE — ED Provider Notes (Signed)
Sent in from Dr. Doren Custard.  66 year old female here with acute delusion and hallucination.  No prior psychiatric disorder.  She has a history of functional quadriplegia.  New breast mass that has not been characterized yet.  She is pending MRI brain to rule out any metastatic disease. Physical Exam  BP (!) 139/104   Pulse (!) 118   Temp 100 F (37.8 C) (Rectal)   Resp (!) 25   SpO2 97%   Physical Exam  Procedures  Procedures  ED Course / MDM    Medical Decision Making Amount and/or Complexity of Data Reviewed Labs: ordered. Radiology: ordered. ECG/medicine tests: ordered.  Risk OTC drugs. Prescription drug management. Decision regarding hospitalization.   She is also persistently tachycardic.  Plan is to follow-up on results of MRI.  Disposition per results of MRI.  MRI shows some skull lesions but they think this is not likely to be metastatic disease.  There is also no evidence of tumor.  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  Reviewed this with Dr. Curly Shores neurology.  She will see in consult but is recommending medicine admission and she will have further recommendations to follow.  Updated patient and son.  11 PM reviewed case with Dr. Marlowe Sax [facility] hospitalist who will evaluate patient for admission.       Hayden Rasmussen, MD 07/02/21 [phone]

## 2021-07-01 NOTE — ED Notes (Addendum)
Pt yelling at this RN because she will not make the imaginary person leave the room who the pt states is sitting on her bed. This RN explained that nobody was in the room with the pt. The pt proceeded to tell this RN she was "stupid and must be a crack head" since I cannot see the person sitting on her bed. Will continue to monitor.

## 2021-07-01 NOTE — H&P (Incomplete)
History and Physical    April Graves:[id number] DOB: 06/25/1955 DOA: 07/01/2021  PCP: Aura Dials, MD  Patient coming from: Home  Chief Complaint: Altered mental status  HPI: April Graves is a 66 y.o. female with medical history significant of functional quadriplegia, osteoarthritis, chronic pain syndrome, PE on Xarelto, history of left gluteus/hip decubitus ulcer, left breast mass discovered on CT done in September 2022 at [facility].  Seen at [facility] ED in March 2023 for altered mental status and was treated for UTI.  She presents to the ED today via EMS for evaluation of altered mental status/hallucinations.  Patient called 911 herself and described someone stabbing her.  Family but reported intermittent confusion and hallucinations over the past month, recently worsening.  On arrival to the ED, patient had a low-grade temperature on arrival (100 F) and was tachycardic and hypertensive.  Not hypoxic.  Labs showing WBC 5.6, hemoglobin 14.1, platelet count 349k.  Sodium 136, potassium 3.8, chloride 105, bicarb 22, BUN 9, creatinine 0.4, glucose 114.  Alk phos 241 (chronically elevated), remainder of LFTs normal.  Lactic acid normal.  INR 1.2.  Blood cultures drawn.  Magnesium 1.8.  COVID test negative.  UA showing negative nitrite, small amount of leukocytes and microscopy showing 11-20 WBCs, and no bacteria.  Urine culture pending.  CTA chest negative for PE but showing a very large left breast mass measuring 8.9 x 7.9 x 7.2 cm with evidence of pectoralis muscle, chest wall, and skin invasion.  There is associated edema and adjacent breast parenchyma.  Findings highly suspicious for a primary breast cancer.  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  Some of these lesions demonstrate mild dural thickening adjacent to them which is likely reactive but radiologist recommending follow-up in 1 year to assess stability.  Also  showing faint, punctate likely subacute infarcts in the left internal capsule and corona radiata.  No evidence of intraparenchymal metastatic disease.  Patient was given Tylenol, Haldol, and 2 L LR boluses.  Tachycardia persisted.  She was actively hallucinating in the ED.  Neurology consulted.  Patient is confused and hallucinating.  She thinks there is a man underneath the ED stretcher who is trying to stab her.  Reports a mild headache.  She has no other complaints.  Denies neck pain/stiffness, fevers, cough, shortness of breath, chest pain, nausea, vomiting, abdominal pain, diarrhea, dysuria, or urinary frequency/urgency.  Review of Systems:  Review of Systems  All other systems reviewed and are negative.  Past Medical History:  Diagnosis Date  . Anemia of chronic disease   . Chronic pain syndrome   . Functional quadriplegia ([place])    noted in d/c summary 03/2015  . History of decubitus ulcer    infected, left glut/hip  . History of malnutrition   . Osteoarthritis of both shoulders   . Poor dentition   . Secondary osteoarthritis of left ankle    Post-traumatic; at the tibiotalar joint.  + severe disuse osteopenia.    Past Surgical History:  Procedure Laterality Date  . KNEE CLOSED REDUCTION  10/16/2011   No fracture.  Procedure: CLOSED MANIPULATION KNEE;  Surgeon: Mauri Pole, MD;  Location: [facility] ORS;  Service: Orthopedics;;  Proxmial Fibula  . TUBAL LIGATION       reports that she has never smoked. She has never used smokeless tobacco. She reports that she does not drink alcohol and does not use drugs.  Allergies  Allergen Reactions  .  Codeine Anxiety and Other (See Comments)  . Lisinopril Anxiety and Other (See Comments)    Heart Races   . Pork-Derived Products     Patient reports she does not eat pork     Family History  Problem Relation Age of Onset  . Hypertension Other     Prior to Admission medications   Medication Sig Start Date End Date Taking? Authorizing  Provider  acetaminophen (TYLENOL) 325 MG tablet Take 2 tablets (650 mg total) by mouth every 6 (six) hours as needed for mild pain (or Fever >/= 101). 03/13/15  Yes Robbie Lis, MD  albuterol (PROVENTIL HFA;VENTOLIN HFA) 108 (90 Base) MCG/ACT inhaler Inhale 1-2 puffs into the lungs every 6 (six) hours as needed for wheezing or shortness of breath.   Yes [provider]  Artificial Saliva (BIOTENE DRY MOUTH) LOZG Use as directed 1 tablet in the mouth or throat daily.   Yes [provider]  aspirin 81 MG chewable tablet Chew 81 mg by mouth daily.   Yes [provider]  diclofenac Sodium (VOLTAREN) 1 % GEL 2 g 4 (four) times daily as needed (pain). 07/13/19  Yes [provider]  erythromycin ophthalmic ointment Place into both eyes 4 (four) times daily. Place a 1/2 inch ribbon of ointment into the lower eyelid. Patient taking differently: Place 1 application. into both eyes 2 (two) times daily. Place a 1/2 inch ribbon of ointment into the lower eyelid. 05/02/21  Yes Truddie Hidden, MD  LUBRICANT EYE DROPS 0.4-0.3 % SOLN Place 1 drop into both eyes daily as needed (eye lubricant). 02/21/17  Yes [provider]  Melatonin 5 MG CHEW Chew 10 mg by mouth at bedtime.   Yes [provider]  Multiple Vitamin (MULTIVITAMIN WITH MINERALS) TABS tablet Take 1 tablet by mouth daily.   Yes [provider]  oxyCODONE-acetaminophen (PERCOCET/ROXICET) 5-325 MG tablet Take 2 tablets by mouth every 6 (six) hours as needed for moderate pain. 03/03/17  Yes Patrecia Pour, Christean Grief, MD  Amino Acids-Protein Hydrolys (FEEDING SUPPLEMENT, PRO-STAT SUGAR FREE 64,) LIQD Take 30 mLs by mouth 2 (two) times daily. Patient not taking: Reported on 03/01/2017 03/13/15   Robbie Lis, MD  fluconazole (DIFLUCAN) 200 MG tablet Take 1 tablet (200 mg total) by mouth daily. Patient not taking: Reported on 07/01/2021 03/04/17   Patrecia Pour, Christean Grief, MD  Rivaroxaban 15 & 20 MG TBPK Take as  directed on package: Start with one 22m tablet by mouth twice a day with food. On Day 22, switch to one 28mtablet once a day with food. Patient not taking: Reported on 07/01/2021 03/03/17   SiDoreatha LewMD    Physical Exam: Vitals:   07/01/21 1933 07/01/21 2000 07/01/21 2030 07/01/21 2100  BP: (!) 165/98 (!) 156/98 (!) 172/103 (!) 193/111  Pulse: (!) 112 (!) 112 (!) 121 (!) 120  Resp: 15 (!) 22 (!) 28 (!) 25  Temp:      TempSrc:      SpO2: 99% 99% 99% 99%    Physical Exam Vitals reviewed.  Constitutional:      General: She is not in acute distress. HENT:     Head: Normocephalic and atraumatic.  Eyes:     Extraocular Movements: Extraocular movements intact.     Conjunctiva/sclera: Conjunctivae normal.  Cardiovascular:     Rate and Rhythm: Normal rate and regular rhythm.     Pulses: Normal pulses.  Pulmonary:     Effort: Pulmonary effort is  normal. No respiratory distress.     Breath sounds: Normal breath sounds. No wheezing or rales.  Abdominal:     General: Bowel sounds are normal. There is no distension.     Palpations: Abdomen is soft.     Tenderness: There is no abdominal tenderness.  Musculoskeletal:        General: No swelling or tenderness.     Cervical back: Normal range of motion.  Skin:    General: Skin is warm and dry.  Neurological:     Mental Status: She is alert and oriented to person, place, and time.     Labs on Admission: I have personally reviewed following labs and imaging studies  CBC: Recent Labs  Lab 07/01/21 0925  WBC 5.6  NEUTROABS 4.2  HGB 14.1  HCT 43.1  MCV 84.5  PLT 621   Basic Metabolic Panel: Recent Labs  Lab 07/01/21 0925  NA 136  K 3.8  CL 105  CO2 22  GLUCOSE 114*  BUN 9  CREATININE 0.41*  CALCIUM 9.9  MG 1.8   GFR: CrCl cannot be calculated (Unknown ideal weight.). Liver Function Tests: Recent Labs  Lab 07/01/21 0925  AST 33  ALT 11  ALKPHOS 241*  BILITOT 0.8  PROT 8.2*  ALBUMIN 3.3*   No  results for input(s): LIPASE, AMYLASE in the last 168 hours. No results for input(s): AMMONIA in the last 168 hours. Coagulation Profile: Recent Labs  Lab 07/01/21 0925  INR 1.2   Cardiac Enzymes: No results for input(s): CKTOTAL, CKMB, CKMBINDEX, TROPONINI in the last 168 hours. BNP (last 3 results) No results for input(s): PROBNP in the last 8760 hours. HbA1C: No results for input(s): HGBA1C in the last 72 hours. CBG: No results for input(s): GLUCAP in the last 168 hours. Lipid Profile: No results for input(s): CHOL, HDL, LDLCALC, TRIG, CHOLHDL, LDLDIRECT in the last 72 hours. Thyroid Function Tests: No results for input(s): TSH, T4TOTAL, FREET4, T3FREE, THYROIDAB in the last 72 hours. Anemia Panel: No results for input(s): VITAMINB12, FOLATE, FERRITIN, TIBC, IRON, RETICCTPCT in the last 72 hours. Urine analysis:    Component Value Date/Time   COLORURINE YELLOW 07/01/2021 1109   APPEARANCEUR CLEAR 07/01/2021 1109   LABSPEC 1.023 07/01/2021 1109   PHURINE 5.0 07/01/2021 1109   GLUCOSEU NEGATIVE 07/01/2021 1109   HGBUR MODERATE (A) 07/01/2021 1109   BILIRUBINUR NEGATIVE 07/01/2021 1109   KETONESUR 20 (A) 07/01/2021 1109   PROTEINUR 30 (A) 07/01/2021 1109   UROBILINOGEN 1.0 10/22/2011 0845   NITRITE NEGATIVE 07/01/2021 1109   LEUKOCYTESUR SMALL (A) 07/01/2021 1109    Radiological Exams on Admission: I have personally reviewed images DG Chest 1 View  Result Date: 07/01/2021 CLINICAL DATA:  Altered mental status EXAM: CHEST  1 VIEW COMPARISON:  05/02/2021 FINDINGS: Low volume chest with interstitial crowding and bands of opacity. There is no edema, consolidation, effusion, or pneumothorax. Normal heart size and mediastinal contours. Marked osteopenia, especially for age. Severe bilateral glenohumeral osteoarthritis. IMPRESSION: Chronic low volume chest with atelectatic type opacity. No acute finding. Electronically Signed   By: Jorje Guild M.D.   On: 07/01/2021 09:53   CT  Head Wo Contrast  Result Date: 07/01/2021 CLINICAL DATA:  Mental status changes. EXAM: CT HEAD WITHOUT CONTRAST TECHNIQUE: Contiguous axial images were obtained from the base of the skull through the vertex without intravenous contrast. RADIATION DOSE REDUCTION: This exam was performed according to the departmental dose-optimization program which includes automated exposure control, adjustment of the mA and/or  kV according to patient size and/or use of iterative reconstruction technique. COMPARISON:  05/02/2021 FINDINGS: Brain: There is no evidence for acute hemorrhage, hydrocephalus, mass lesion, or abnormal extra-axial fluid collection. No definite CT evidence for acute infarction. Patchy low attenuation in the deep hemispheric and periventricular white matter is nonspecific, but likely reflects chronic microvascular ischemic demyelination. Vascular: The Skull: Markedly heterogeneous mineralization with areas of focal osteopenia. Lucent lesion in the right frontal parietal region measures up to 2.8 cm with left frontal lesion measuring 4.3 cm. Sinuses/Orbits: Right mastoid effusion is similar to prior. Chronic mucosal disease noted right maxillary sinus. Other: None. IMPRESSION: 1. No acute intracranial abnormality. 2. Similar markedly heterogeneous mineralization of the calvarium with areas of focal osteopenia. Lucent lesions in the right frontal parietal region and left frontal lobe are suspicious for metastatic disease. Consider MRI brain with and without contrast to further evaluate. 3. Right mastoid effusion again noted. 4. Chronic right maxillary sinusitis. Electronically Signed   By: Misty Stanley M.D.   On: 07/01/2021 08:54   CT Angio Chest PE W and/or Wo Contrast  Result Date: 07/01/2021 CLINICAL DATA:  Altered mental status. Clinical concern for pulmonary embolism. EXAM: CT ANGIOGRAPHY CHEST WITH CONTRAST TECHNIQUE: Multidetector CT imaging of the chest was performed using the standard protocol  during bolus administration of intravenous contrast. Multiplanar CT image reconstructions and MIPs were obtained to evaluate the vascular anatomy. RADIATION DOSE REDUCTION: This exam was performed according to the departmental dose-optimization program which includes automated exposure control, adjustment of the mA and/or kV according to patient size and/or use of iterative reconstruction technique. CONTRAST:  60m OMNIPAQUE IOHEXOL 350 MG/ML SOLN COMPARISON:  Chest CTA dated 03/01/2017. Chest radiograph obtained earlier today. FINDINGS: Cardiovascular: Normally opacified pulmonary arteries with no pulmonary arterial filling defects seen. Normal sized heart. Diffuse left ventricular wall thickening. Small pericardial effusion with a maximum thickness of 10 mm. Mediastinum/Nodes: No enlarged mediastinal, hilar, or axillary lymph nodes. Thyroid gland, trachea, and esophagus demonstrate no significant findings. Lungs/Pleura: Mild atelectasis on the right. Minimal atelectasis on the left. No lung nodules or pleural fluid seen. Upper Abdomen: Distended gallbladder. No gallstones, wall thickening or pericholecystic fluid seen. The remainder of the upper abdomen is unremarkable. Musculoskeletal: Interval very large left breast mass measuring 8.9 x 7.2 cm on image number 87/5 and 7.9 cm in length on image number 33/10. This is invading the underlying pectoralis major muscle with probable chest wall invasion. No bone destruction seen. Edema in the adjacent breast tissues. This extends to the skin anteriorly with associated skin thickening. Severe bilateral shoulder degenerative changes, left-greater-than-right. Mild thoracic scoliosis. Mild cervicothoracic degenerative changes. No evidence of bony metastatic disease. Review of the MIP images confirms the above findings. IMPRESSION: 1. Very large left breast mass measuring 8.9 x 7.9 x 7.2 cm with evidence of pectoralis muscle, chest wall and skin invasion. There is associated  edema in the adjacent breast parenchyma. This is highly suspicious for a primary breast cancer. Further evaluation with an outpatient bilateral diagnostic mammogram and left breast ultrasound is recommended. 2. No evidence of pulmonary embolism. 3. Mild right and minimal left lung atelectasis. 4. Severe bilateral shoulder degenerative changes. Electronically Signed   By: SClaudie ReveringM.D.   On: 07/01/2021 14:11   MR Brain W and Wo Contrast  Result Date: 07/01/2021 CLINICAL DATA:  Mental status change, hallucinations, metastatic disease evaluation EXAM: MRI HEAD WITHOUT AND WITH CONTRAST TECHNIQUE: Multiplanar, multiecho pulse sequences of the brain and surrounding structures were obtained  without and with intravenous contrast. CONTRAST:  6.25m GADAVIST GADOBUTROL 1 MMOL/ML IV SOLN COMPARISON:  No prior MRI, correlation is made with CT head 07/01/2021 FINDINGS: Evaluation is somewhat limited by motion artifact. Brain: Faint, punctate foci of mildly increased signal on diffusion-weighted imaging, which do not have definite ADC correlates, in the posterior limb of the left internal capsule and corona radiata (series 9, image 75, 87, and 88), possibly subacute infarcts, although some of these demonstrate slightly increased signal on the ADC and may represent T2 shine through. These are not associated with contrast enhancement No acute hemorrhage, mass, mass effect, or midline shift. No abnormal parenchymal enhancement. Scattered foci of hemosiderin deposition in the basal ganglia, thalami, cerebellum, and cerebral hemispheres, most likely sequela of prior hypertensive microhemorrhages. T2 hyperintense signal in the periventricular white matter and pons, likely the sequela of moderate chronic small vessel ischemic disease. Dural thickening and enhancement overlying the left frontal lobe (series 24, image 23 and series 23, image 20) and right frontoparietal region (series 25, image 7 and series 24, image 12), which  are subjacent to calvarial lesions, described below. Additional dural thickening measuring up to 2.5 x 3.0 x 0.3 cm (series 25, image 7 and series 24, image 12 overlying the right frontoparietal lobes is seen subjacent to a right parietal calvarium T1 and T2 hypointense lesion that demonstrates areas of enhancement and measures up to 2.5 x 2.6 x 1.0 cm (AP x TR x CC) (series 25, image 6 and series 24, image 13). This also correlates with a lytic lesion on the prior CT Vascular: Normal flow voids. Skull and upper cervical spine: Numerous calvarial lesions, the largest of which include a T1 and T2 hyperintense calvarial lesion with some contrast enhancement that measures 6.2 x 4.6 x 1.3 cm (series 25, image 16), and a T1 and T2 hypointense, faintly enhancing right parietal lesion that measures up to 2.5 x 2.6 x 1.0 cm (series 25, image 6), which correlate with lucent lesions on the same-day CT. Right frontal T1 and T2 hypointense lesion, with some faint enhancement, that measures 3.2 x 2.0 x 1.2 cm (series 25, image 9) and a right parietal T1 and T2 hypointense lesion without significant contrast enhancement that measures 4.5 x 3.8 x 0.8 cm (series 23, image 18). These correlate with sclerotic lesions on the same-day CT. Additional smaller lesions are also seen. Sinuses/Orbits: Mucous retention cyst in the right maxillary sinus. No acute finding in the orbits. Fluid in the right-greater-than-left mastoid air cells. Other: None. IMPRESSION: 1. 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 Some of these lesions demonstrate mild dural thickening subjacent to them, which is likely reactive. Recommend follow-up in 1 year to assess stability. 2. Faint, punctate, likely subacute infarcts in the left internal capsule and corona radiata. 3. No evidence of intraparenchymal metastatic  disease. These results were called by telephone at the time of interpretation on 07/01/2021 at 8:27 pm to provider BUTLER, who verbally acknowledged these results. Electronically Signed   By: AMerilyn BabaM.D.   On: 07/01/2021 20:27    EKG: Independently reviewed.  Sinus tachycardia, LVH.  Assessment and Plan  Altered mental status/hallucinations Family reported intermittent confusion and hallucinations over the past month, recently worsening.  No known history of dementia or psychiatric disorders.  Patient is actively hallucinating at this time and thinks there is a man  underneath the ED stretcher who is trying to stab her.  She was seen in the ED back in March for similar presentation and was treated for UTI at that time.  UA done today is equivocal. 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  Some of these lesions demonstrate mild dural thickening adjacent to them which is likely reactive but radiologist recommending follow-up in 1 year to assess stability.  Also showing faint, punctate likely subacute infarcts in the left internal capsule and corona radiata.  No evidence of intraparenchymal metastatic disease. -Neurology consulted -Check TSH, B12, and ammonia levels.  SIRS ?UTI Patient had a low-grade temperature on arrival to the ED (100 F) and continues to be persistently tachycardic with heart rate in the 120s despite being given 2 L IV fluid boluses.  Blood pressure elevated.  No leukocytosis or lactic acidosis.  No evidence of pneumonia on CT.  COVID test negative.  UA showing negative nitrite, small amount of leukocytes and microscopy showing 11-20 WBCs, and no bacteria.  No skin wounds or decubitus ulcers reported by nursing staff. -Continue IV fluid hydration -Start ceftriaxone -Tylenol as needed for fevers -Urine culture pending -Blood cultures pending -Check procalcitonin level -Check second set of lactate  Left breast mass highly  suspicious for a primary breast cancer Per review of chart, left breast mass initially discovered on CT done in September 2022 at [facility] but patient lost to follow-up.  CT done today showing a very large left breast mass measuring 8.9 x 7.9 x 7.2 cm with evidence of pectoralis muscle, chest wall, and skin invasion.  There is associated edema and adjacent breast parenchyma.  Findings highly suspicious for a primary breast cancer. -Please ensure close follow-up with oncology for outpatient work-up including bilateral diagnostic mammogram and left breast ultrasound.  History of left gluteus/hip decubitus ulcer  Sinus tachycardia Likely due to infection/ ?UTI.  CTA chest negative for PE. -Continue antibiotic and IV fluid hydration -Check TSH level  History of PE on Xarelto      DVT prophylaxis: Xarelto Code Status: {Blank single:19197::"Full Code","DNR","DNR/DNI","Comfort Care","***"} Family Communication: ***  Consults called: Neurology Level of care: Progressive Care Unit Admission status: It is my clinical opinion that admission to INPATIENT is reasonable and necessary because of the expectation that this patient will require hospital care that crosses at least 2 midnights to treat this condition based on the medical complexity of the problems presented.  Given the aforementioned information, the predictability of an adverse outcome is felt to be significant.   Shela Leff MD [facility]  If 7PM-7AM, please contact night-coverage [email or web address]  07/01/2021, 10:58 PM

## 2021-07-01 NOTE — Consult Note (Incomplete)
Neurology Consultation *** Reason for Consult: Abnormal MRI and new onset hallucinations Requesting Physician: Aletta Edouard  CC: ***  History is obtained from: *** chart review and   HPI: April Graves is a 66 y.o. female ***   LKW: *** tPA given?: No, or if yes, time given *** IA performed?: No, or if yes, groin puncture time: *** Premorbid modified rankin scale: ***     0 - No symptoms.     1 - No significant disability. Able to carry out all usual activities, despite some symptoms.     2 - Slight disability. Able to look after own affairs without assistance, but unable to carry out all previous activities.     3 - Moderate disability. Requires some help, but able to walk unassisted.     4 - Moderately severe disability. Unable to attend to own bodily needs without assistance, and unable to walk unassisted.     5 - Severe disability. Requires constant nursing care and attention, bedridden, incontinent.     6 - Dead. ICH Score: ***  Time performed: *** GCS: {Blank single:19197::"3-4 is 2 points","5-12 is 1 point","13-15 is 0 points"} Infratentorial: {yes no:314532}. If yes, 1 point Volume: {Blank single:19197::">30cc is 1 point","<30cc is 0 points"}  Age: 66 y.o.. >80 is 1 point Intraventricular extension is 1 point  Score:***  A Score of {Blank single:19197::"0 points has a 30 day mortality of 0%","1 points has a 30 day mortality of 13%","2 points has a 30 day mortality of 26%","3 points has a 30 day mortality of 72%","4 points has a 30 day mortality of 97%","5-6 points has a 30 day mortality of 100%"}. Stroke. 2001 Apr;32(4):891-7.    ROS: All other review of systems was negative except as noted in the HPI. *** Unable to obtain due to altered mental status.   Past Medical History:  Diagnosis Date  . Anemia of chronic disease   . Chronic pain syndrome   . Functional quadriplegia ([place])    noted in d/c summary 03/2015  . History of decubitus ulcer    infected, left  glut/hip  . History of malnutrition   . Osteoarthritis of both shoulders   . Poor dentition   . Secondary osteoarthritis of left ankle    Post-traumatic; at the tibiotalar joint.  + severe disuse osteopenia.   ***  Family History  Problem Relation Age of Onset  . Hypertension Other    ***  Social History:  reports that she has never smoked. She has never used smokeless tobacco. She reports that she does not drink alcohol and does not use drugs. ***  Exam: Current vital signs: BP (!) 193/111   Pulse (!) 120   Temp 100 F (37.8 C) (Rectal)   Resp (!) 25   SpO2 99%  Vital signs in last 24 hours: Temp:  [100 F (37.8 C)] 100 F (37.8 C) (05/27 0745) Pulse Rate:  [112-140] 120 (05/27 2100) Resp:  [14-31] 25 (05/27 2100) BP: (139-193)/(90-174) 193/111 (05/27 2100) SpO2:  [94 %-100 %] 99 % (05/27 2100)   Physical Exam  Constitutional: Appears well-developed and well-nourished.  Psych: Affect appropriate to situation, *** Eyes: No scleral injection HENT: No oropharyngeal obstruction.  MSK: no joint deformities.  Cardiovascular: Normal rate and regular rhythm. *** Perfusing extremities well Respiratory: Effort normal, non-labored breathing GI: Soft.  No distension. There is no tenderness.  Skin: Warm dry and intact visible skin  Neuro: Mental Status: Patient is awake, alert, oriented to person, place,  month, year, and situation.*** Patient is able to give a clear and coherent history.*** No signs of aphasia or neglect*** Cranial Nerves: II: Visual Fields are full. Pupils are equal, round, and reactive to light.  *** III,IV, VI: EOMI without ptosis or diploplia.  V: Facial sensation is symmetric to temperature VII: Facial movement is symmetric.  VIII: hearing is intact to voice X: Uvula elevates symmetrically XI: Shoulder shrug is symmetric. XII: tongue is midline without atrophy or fasciculations.  Motor: Tone is normal. Bulk is normal. 5/5 strength was present in  all four extremities. *** Sensory: Sensation is symmetric to light touch and temperature in the arms and legs.*** Deep Tendon Reflexes: 2+ and symmetric in the brachioradialis and patellae. *** Plantars: Toes are downgoing bilaterally. *** Cerebellar: FNF and HKS are intact bilaterally*** Gait:  Deferred in acute setting ***  NIHSS total *** Score breakdown: *** Performed at *** time of patient arrival to ED    I have reviewed labs in epic and the results pertinent to this consultation are: ***  I have reviewed the images obtained:***   Impression: ***  Recommendations: - ***   Lesleigh Noe MD-PhD [facility] [phone]   *** [facility], [facility], [facility]ene[facility]    Total critical care time: *** minutes   Critical care time was exclusive of separately billable procedures and treating other patients.   Critical care was necessary to treat or prevent imminent or life-threatening deterioration.   Critical care was time spent personally by me on the following activities: development of treatment plan with patient and/or surrogate as well as nursing, discussions with consultants/primary team, evaluation of patient's response to treatment, examination of patient, obtaining history from patient or surrogate, ordering and performing treatments and interventions, ordering and review of laboratory studies, ordering and review of radiographic studies, and re-evaluation of patient's condition as needed, as documented above.

## 2021-07-01 NOTE — ED Notes (Signed)
Attempted an in and out 3 different times. Pt is too contracted and unable to get her legs spread apart. This RN along with the NT, EMT and another RN could not see anything d/t pt's condition and was unable to obtain any urine.
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
## 2021-07-01 NOTE — H&P (Incomplete)
History and Physical    ARABELL NERIA IBB:[id number] DOB: [birth year] DOA: 07/01/2021  PCP: Aura Dials, MD  Patient coming from: Home  Chief Complaint: Altered mental status  HPI: April Graves is a 66 y.o. female with medical history significant of functional quadriplegia, osteoarthritis, chronic pain syndrome, history of PE in January 2019 no longer on anticoagulation, history of left gluteus/hip decubitus ulcer, left breast mass discovered on CT done in September 2022 at [facility].  Seen at [facility] ED in March 2023 for altered mental status and was treated for UTI.  She presents to the ED today via EMS for evaluation of altered mental status/hallucinations.  Patient called 911 herself and described someone stabbing her.  Family but reported intermittent confusion and hallucinations over the past month, recently worsening.  On arrival to the ED, patient had a low-grade temperature on arrival (100 F) and was tachycardic and hypertensive.  Not hypoxic.  Labs showing WBC 5.6, hemoglobin 14.1, platelet count 349k.  Sodium 136, potassium 3.8, chloride 105, bicarb 22, BUN 9, creatinine 0.4, glucose 114.  Alk phos 241 (chronically elevated), remainder of LFTs normal.  Lactic acid normal.  INR 1.2.  Blood cultures drawn.  Magnesium 1.8.  COVID test negative.  UA showing negative nitrite, small amount of leukocytes and microscopy showing 11-20 WBCs, and no bacteria.  Urine culture pending.  CTA chest negative for PE but showing a very large left breast mass measuring 8.9 x 7.9 x 7.2 cm with evidence of pectoralis muscle, chest wall, and skin invasion.  There is associated edema and adjacent breast parenchyma.  Findings highly suspicious for a primary breast cancer.  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  Some of these lesions demonstrate mild dural thickening adjacent to them which is likely reactive but radiologist recommending  follow-up in 1 year to assess stability.  Also showing faint, punctate likely subacute infarcts in the left internal capsule and corona radiata.  No evidence of intraparenchymal metastatic disease.  Patient was given Tylenol, Haldol, and 2 L LR boluses.  Tachycardia persisted.  She was actively hallucinating in the ED.  Neurology consulted.  Patient is confused and hallucinating.  She thinks there is a man underneath the ED stretcher who is trying to stab her.  Reports a mild headache.  She has no other complaints.  Denies neck pain/stiffness, fevers, cough, shortness of breath, chest pain, nausea, vomiting, abdominal pain, diarrhea, dysuria, or urinary frequency/urgency.  Review of Systems:  Review of Systems  All other systems reviewed and are negative.  Past Medical History:  Diagnosis Date   Anemia of chronic disease    Chronic pain syndrome    Functional quadriplegia (HCC)    noted in d/c summary 03/2015   History of decubitus ulcer    infected, left glut/hip   History of malnutrition    Osteoarthritis of both shoulders    Poor dentition    Secondary osteoarthritis of left ankle    Post-traumatic; at the tibiotalar joint.  + severe disuse osteopenia.    Past Surgical History:  Procedure Laterality Date   KNEE CLOSED REDUCTION  10/16/2011   No fracture.  Procedure: CLOSED MANIPULATION KNEE;  Surgeon: Mauri Pole, MD;  Location: [facility] ORS;  Service: Orthopedics;;  Proxmial Fibula   TUBAL LIGATION       reports that she has never smoked. She has never used smokeless tobacco. She reports that she does not drink alcohol and does not  use drugs.  Allergies  Allergen Reactions   Codeine Anxiety and Other (See Comments)   Lisinopril Anxiety and Other (See Comments)    Heart Races    Pork-Derived Products     Patient reports she does not eat pork     Family History  Problem Relation Age of Onset   Hypertension Other     Prior to Admission medications   Medication Sig Start  Date End Date Taking? Authorizing Provider  acetaminophen (TYLENOL) 325 MG tablet Take 2 tablets (650 mg total) by mouth every 6 (six) hours as needed for mild pain (or Fever >/= 101). 03/13/15  Yes Robbie Lis, MD  albuterol (PROVENTIL HFA;VENTOLIN HFA) 108 (90 Base) MCG/ACT inhaler Inhale 1-2 puffs into the lungs every 6 (six) hours as needed for wheezing or shortness of breath.   Yes [provider]  Artificial Saliva (BIOTENE DRY MOUTH) LOZG Use as directed 1 tablet in the mouth or throat daily.   Yes [provider]  aspirin 81 MG chewable tablet Chew 81 mg by mouth daily.   Yes [provider]  diclofenac Sodium (VOLTAREN) 1 % GEL 2 g 4 (four) times daily as needed (pain). 07/13/19  Yes [provider]  erythromycin ophthalmic ointment Place into both eyes 4 (four) times daily. Place a 1/2 inch ribbon of ointment into the lower eyelid. Patient taking differently: Place 1 application. into both eyes 2 (two) times daily. Place a 1/2 inch ribbon of ointment into the lower eyelid. 05/02/21  Yes Truddie Hidden, MD  LUBRICANT EYE DROPS 0.4-0.3 % SOLN Place 1 drop into both eyes daily as needed (eye lubricant). 02/21/17  Yes [provider]  Melatonin 5 MG CHEW Chew 10 mg by mouth at bedtime.   Yes [provider]  Multiple Vitamin (MULTIVITAMIN WITH MINERALS) TABS tablet Take 1 tablet by mouth daily.   Yes [provider]  oxyCODONE-acetaminophen (PERCOCET/ROXICET) 5-325 MG tablet Take 2 tablets by mouth every 6 (six) hours as needed for moderate pain. 03/03/17  Yes Patrecia Pour, Christean Grief, MD  Amino Acids-Protein Hydrolys (FEEDING SUPPLEMENT, PRO-STAT SUGAR FREE 64,) LIQD Take 30 mLs by mouth 2 (two) times daily. Patient not taking: Reported on 03/01/2017 03/13/15   Robbie Lis, MD  fluconazole (DIFLUCAN) 200 MG tablet Take 1 tablet (200 mg total) by mouth daily. Patient not taking: Reported on 07/01/2021 03/04/17   Patrecia Pour, Christean Grief, MD   Rivaroxaban 15 & 20 MG TBPK Take as directed on package: Start with one 77m tablet by mouth twice a day with food. On Day 22, switch to one 237mtablet once a day with food. Patient not taking: Reported on 07/01/2021 03/03/17   SiDoreatha LewMD    Physical Exam: Vitals:   07/01/21 1933 07/01/21 2000 07/01/21 2030 07/01/21 2100  BP: (!) 165/98 (!) 156/98 (!) 172/103 (!) 193/111  Pulse: (!) 112 (!) 112 (!) 121 (!) 120  Resp: 15 (!) 22 (!) 28 (!) 25  Temp:      TempSrc:      SpO2: 99% 99% 99% 99%    Physical Exam Vitals reviewed.  Constitutional:      General: She is not in acute distress. HENT:     Head: Normocephalic and atraumatic.     Mouth/Throat:     Comments: Very dry mucous membranes Eyes:     Extraocular Movements: Extraocular movements intact.     Conjunctiva/sclera: Conjunctivae normal.  Cardiovascular:     Rate and Rhythm:  Regular rhythm. Tachycardia present.     Pulses: Normal pulses.  Pulmonary:     Effort: Pulmonary effort is normal. No respiratory distress.     Breath sounds: Normal breath sounds. No wheezing or rales.  Abdominal:     General: Bowel sounds are normal. There is no distension.     Palpations: Abdomen is soft.     Tenderness: There is no abdominal tenderness.  Musculoskeletal:        General: No swelling or tenderness.     Cervical back: Normal range of motion.  Skin:    General: Skin is warm and dry.  Neurological:     Mental Status: She is alert and oriented to person, place, and time.     Labs on Admission: I have personally reviewed following labs and imaging studies  CBC: Recent Labs  Lab 07/01/21 0925  WBC 5.6  NEUTROABS 4.2  HGB 14.1  HCT 43.1  MCV 84.5  PLT 109   Basic Metabolic Panel: Recent Labs  Lab 07/01/21 0925  NA 136  K 3.8  CL 105  CO2 22  GLUCOSE 114*  BUN 9  CREATININE 0.41*  CALCIUM 9.9  MG 1.8   GFR: CrCl cannot be calculated (Unknown ideal weight.). Liver Function Tests: Recent Labs   Lab 07/01/21 0925  AST 33  ALT 11  ALKPHOS 241*  BILITOT 0.8  PROT 8.2*  ALBUMIN 3.3*   No results for input(s): LIPASE, AMYLASE in the last 168 hours. No results for input(s): AMMONIA in the last 168 hours. Coagulation Profile: Recent Labs  Lab 07/01/21 0925  INR 1.2   Cardiac Enzymes: No results for input(s): CKTOTAL, CKMB, CKMBINDEX, TROPONINI in the last 168 hours. BNP (last 3 results) No results for input(s): PROBNP in the last 8760 hours. HbA1C: No results for input(s): HGBA1C in the last 72 hours. CBG: No results for input(s): GLUCAP in the last 168 hours. Lipid Profile: No results for input(s): CHOL, HDL, LDLCALC, TRIG, CHOLHDL, LDLDIRECT in the last 72 hours. Thyroid Function Tests: No results for input(s): TSH, T4TOTAL, FREET4, T3FREE, THYROIDAB in the last 72 hours. Anemia Panel: No results for input(s): VITAMINB12, FOLATE, FERRITIN, TIBC, IRON, RETICCTPCT in the last 72 hours. Urine analysis:    Component Value Date/Time   COLORURINE YELLOW 07/01/2021 1109   APPEARANCEUR CLEAR 07/01/2021 1109   LABSPEC 1.023 07/01/2021 1109   PHURINE 5.0 07/01/2021 1109   GLUCOSEU NEGATIVE 07/01/2021 1109   HGBUR MODERATE (A) 07/01/2021 1109   BILIRUBINUR NEGATIVE 07/01/2021 1109   KETONESUR 20 (A) 07/01/2021 1109   PROTEINUR 30 (A) 07/01/2021 1109   UROBILINOGEN 1.0 10/22/2011 0845   NITRITE NEGATIVE 07/01/2021 1109   LEUKOCYTESUR SMALL (A) 07/01/2021 1109    Radiological Exams on Admission: I have personally reviewed images DG Chest 1 View  Result Date: 07/01/2021 CLINICAL DATA:  Altered mental status EXAM: CHEST  1 VIEW COMPARISON:  05/02/2021 FINDINGS: Low volume chest with interstitial crowding and bands of opacity. There is no edema, consolidation, effusion, or pneumothorax. Normal heart size and mediastinal contours. Marked osteopenia, especially for age. Severe bilateral glenohumeral osteoarthritis. IMPRESSION: Chronic low volume chest with atelectatic type  opacity. No acute finding. Electronically Signed   By: Jorje Guild M.D.   On: 07/01/2021 09:53   CT Head Wo Contrast  Result Date: 07/01/2021 CLINICAL DATA:  Mental status changes. EXAM: CT HEAD WITHOUT CONTRAST TECHNIQUE: Contiguous axial images were obtained from the base of the skull through the vertex without intravenous contrast. RADIATION DOSE  REDUCTION: This exam was performed according to the departmental dose-optimization program which includes automated exposure control, adjustment of the mA and/or kV according to patient size and/or use of iterative reconstruction technique. COMPARISON:  05/02/2021 FINDINGS: Brain: There is no evidence for acute hemorrhage, hydrocephalus, mass lesion, or abnormal extra-axial fluid collection. No definite CT evidence for acute infarction. Patchy low attenuation in the deep hemispheric and periventricular white matter is nonspecific, but likely reflects chronic microvascular ischemic demyelination. Vascular: The Skull: Markedly heterogeneous mineralization with areas of focal osteopenia. Lucent lesion in the right frontal parietal region measures up to 2.8 cm with left frontal lesion measuring 4.3 cm. Sinuses/Orbits: Right mastoid effusion is similar to prior. Chronic mucosal disease noted right maxillary sinus. Other: None. IMPRESSION: 1. No acute intracranial abnormality. 2. Similar markedly heterogeneous mineralization of the calvarium with areas of focal osteopenia. Lucent lesions in the right frontal parietal region and left frontal lobe are suspicious for metastatic disease. Consider MRI brain with and without contrast to further evaluate. 3. Right mastoid effusion again noted. 4. Chronic right maxillary sinusitis. Electronically Signed   By: Misty Stanley M.D.   On: 07/01/2021 08:54   CT Angio Chest PE W and/or Wo Contrast  Result Date: 07/01/2021 CLINICAL DATA:  Altered mental status. Clinical concern for pulmonary embolism. EXAM: CT ANGIOGRAPHY CHEST  WITH CONTRAST TECHNIQUE: Multidetector CT imaging of the chest was performed using the standard protocol during bolus administration of intravenous contrast. Multiplanar CT image reconstructions and MIPs were obtained to evaluate the vascular anatomy. RADIATION DOSE REDUCTION: This exam was performed according to the departmental dose-optimization program which includes automated exposure control, adjustment of the mA and/or kV according to patient size and/or use of iterative reconstruction technique. CONTRAST:  36m OMNIPAQUE IOHEXOL 350 MG/ML SOLN COMPARISON:  Chest CTA dated 03/01/2017. Chest radiograph obtained earlier today. FINDINGS: Cardiovascular: Normally opacified pulmonary arteries with no pulmonary arterial filling defects seen. Normal sized heart. Diffuse left ventricular wall thickening. Small pericardial effusion with a maximum thickness of 10 mm. Mediastinum/Nodes: No enlarged mediastinal, hilar, or axillary lymph nodes. Thyroid gland, trachea, and esophagus demonstrate no significant findings. Lungs/Pleura: Mild atelectasis on the right. Minimal atelectasis on the left. No lung nodules or pleural fluid seen. Upper Abdomen: Distended gallbladder. No gallstones, wall thickening or pericholecystic fluid seen. The remainder of the upper abdomen is unremarkable. Musculoskeletal: Interval very large left breast mass measuring 8.9 x 7.2 cm on image number 87/5 and 7.9 cm in length on image number 33/10. This is invading the underlying pectoralis major muscle with probable chest wall invasion. No bone destruction seen. Edema in the adjacent breast tissues. This extends to the skin anteriorly with associated skin thickening. Severe bilateral shoulder degenerative changes, left-greater-than-right. Mild thoracic scoliosis. Mild cervicothoracic degenerative changes. No evidence of bony metastatic disease. Review of the MIP images confirms the above findings. IMPRESSION: 1. Very large left breast mass measuring  8.9 x 7.9 x 7.2 cm with evidence of pectoralis muscle, chest wall and skin invasion. There is associated edema in the adjacent breast parenchyma. This is highly suspicious for a primary breast cancer. Further evaluation with an outpatient bilateral diagnostic mammogram and left breast ultrasound is recommended. 2. No evidence of pulmonary embolism. 3. Mild right and minimal left lung atelectasis. 4. Severe bilateral shoulder degenerative changes. Electronically Signed   By: SClaudie ReveringM.D.   On: 07/01/2021 14:11   MR Brain W and Wo Contrast  Result Date: 07/01/2021 CLINICAL DATA:  Mental status change, hallucinations, metastatic disease  evaluation EXAM: MRI HEAD WITHOUT AND WITH CONTRAST TECHNIQUE: Multiplanar, multiecho pulse sequences of the brain and surrounding structures were obtained without and with intravenous contrast. CONTRAST:  6.11m GADAVIST GADOBUTROL 1 MMOL/ML IV SOLN COMPARISON:  No prior MRI, correlation is made with CT head 07/01/2021 FINDINGS: Evaluation is somewhat limited by motion artifact. Brain: Faint, punctate foci of mildly increased signal on diffusion-weighted imaging, which do not have definite ADC correlates, in the posterior limb of the left internal capsule and corona radiata (series 9, image 75, 87, and 88), possibly subacute infarcts, although some of these demonstrate slightly increased signal on the ADC and may represent T2 shine through. These are not associated with contrast enhancement No acute hemorrhage, mass, mass effect, or midline shift. No abnormal parenchymal enhancement. Scattered foci of hemosiderin deposition in the basal ganglia, thalami, cerebellum, and cerebral hemispheres, most likely sequela of prior hypertensive microhemorrhages. T2 hyperintense signal in the periventricular white matter and pons, likely the sequela of moderate chronic small vessel ischemic disease. Dural thickening and enhancement overlying the left frontal lobe (series 24, image 23 and  series 23, image 20) and right frontoparietal region (series 25, image 7 and series 24, image 12), which are subjacent to calvarial lesions, described below. Additional dural thickening measuring up to 2.5 x 3.0 x 0.3 cm (series 25, image 7 and series 24, image 12 overlying the right frontoparietal lobes is seen subjacent to a right parietal calvarium T1 and T2 hypointense lesion that demonstrates areas of enhancement and measures up to 2.5 x 2.6 x 1.0 cm (AP x TR x CC) (series 25, image 6 and series 24, image 13). This also correlates with a lytic lesion on the prior CT Vascular: Normal flow voids. Skull and upper cervical spine: Numerous calvarial lesions, the largest of which include a T1 and T2 hyperintense calvarial lesion with some contrast enhancement that measures 6.2 x 4.6 x 1.3 cm (series 25, image 16), and a T1 and T2 hypointense, faintly enhancing right parietal lesion that measures up to 2.5 x 2.6 x 1.0 cm (series 25, image 6), which correlate with lucent lesions on the same-day CT. Right frontal T1 and T2 hypointense lesion, with some faint enhancement, that measures 3.2 x 2.0 x 1.2 cm (series 25, image 9) and a right parietal T1 and T2 hypointense lesion without significant contrast enhancement that measures 4.5 x 3.8 x 0.8 cm (series 23, image 18). These correlate with sclerotic lesions on the same-day CT. Additional smaller lesions are also seen. Sinuses/Orbits: Mucous retention cyst in the right maxillary sinus. No acute finding in the orbits. Fluid in the right-greater-than-left mastoid air cells. Other: None. IMPRESSION: 1. 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 Some of these lesions demonstrate mild dural thickening subjacent to them, which is likely reactive. Recommend follow-up in 1 year to assess stability. 2. Faint, punctate, likely subacute  infarcts in the left internal capsule and corona radiata. 3. No evidence of intraparenchymal metastatic disease. These results were called by telephone at the time of interpretation on 07/01/2021 at 8:27 pm to provider BUTLER, who verbally acknowledged these results. Electronically Signed   By: AMerilyn BabaM.D.   On: 07/01/2021 20:27    EKG: Independently reviewed.  Sinus tachycardia, LVH.  Assessment and Plan  Altered mental status/hallucinations Family reported intermittent confusion and hallucinations over the past month, recently worsening.  No  known history of dementia or psychiatric disorders.  Patient is actively hallucinating at this time and thinks there is a man underneath the ED stretcher who is trying to stab her.  She was seen in the ED back in March for similar presentation and was treated for UTI at that time.  UA done today is equivocal. 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  Some of these lesions demonstrate mild dural thickening adjacent to them which is likely reactive but radiologist recommending follow-up in 1 year to assess stability.  Also showing faint, punctate likely subacute infarcts in the left internal capsule and corona radiata.  No evidence of intraparenchymal metastatic disease. -Neurology consulted -Check TSH, B12, and ammonia levels.  SIRS ?UTI Patient had a low-grade temperature on arrival to the ED (100 F) and continues to be persistently tachycardic with heart rate in the 120s despite being given 2 L IV fluid boluses.  Blood pressure elevated.  No leukocytosis or lactic acidosis.  No evidence of pneumonia on CT.  COVID test negative.  UA showing negative nitrite, small amount of leukocytes and microscopy showing 11-20 WBCs, and no bacteria.  No obvious decubitus ulcers or skin infection noted on exam. -Continue IV fluid hydration -Start ceftriaxone for possible UTI -Tylenol as needed for fevers -Urine culture  pending -Blood cultures pending -Check procalcitonin level -Check second set of lactate  Left breast mass highly suspicious for a primary breast cancer Per review of chart, left breast mass initially discovered on CT done in September 2022 at [facility] but patient lost to follow-up.  CT done today showing a very large left breast mass measuring 8.9 x 7.9 x 7.2 cm with evidence of pectoralis muscle, chest wall, and skin invasion.  There is associated edema and adjacent breast parenchyma.  Findings highly suspicious for a primary breast cancer. -No family available at this time, please discuss in the morning. -She will need close follow-up with oncology for outpatient work-up including bilateral diagnostic mammogram and left breast ultrasound.  Sinus tachycardia Dehydration likely contributing, she has very dry mucous membranes. ?Infection/ UTI. CTA chest negative for PE.  Persistently tachycardic with heart rate currently in the 110s to 120s. -Continue antibiotic for UTI and IV fluid hydration -Check TSH level  DVT prophylaxis:  Fondaparinux (allergic to heparin/pork derived products)  Addendum 07/02/2021 at 1:05 AM: Informed by pharmacy that patient is not allergic to heparin or Lovenox and has received both in the past.  Code Status: Full Code Family Communication: No family available at this time. Consults called: Neurology Level of care: Progressive Care Unit Admission status: It is my clinical opinion that admission to INPATIENT is reasonable and necessary because of the expectation that this patient will require hospital care that crosses at least 2 midnights to treat this condition based on the medical complexity of the problems presented.  Given the aforementioned information, the predictability of an adverse outcome is felt to be significant.   Shela Leff MD [facility]  If 7PM-7AM, please contact night-coverage [email or web address]  07/01/2021, 10:58 PM

## 2021-07-02 ENCOUNTER — Inpatient Hospital Stay (HOSPITAL_COMMUNITY): Payer: Medicare Other

## 2021-07-02 DIAGNOSIS — N63 Unspecified lump in unspecified breast: Secondary | ICD-10-CM

## 2021-07-02 DIAGNOSIS — I6389 Other cerebral infarction: Secondary | ICD-10-CM | POA: Diagnosis not present

## 2021-07-02 DIAGNOSIS — N6320 Unspecified lump in the left breast, unspecified quadrant: Secondary | ICD-10-CM

## 2021-07-02 DIAGNOSIS — R443 Hallucinations, unspecified: Secondary | ICD-10-CM

## 2021-07-02 DIAGNOSIS — R6510 Systemic inflammatory response syndrome (SIRS) of non-infectious origin without acute organ dysfunction: Secondary | ICD-10-CM

## 2021-07-02 DIAGNOSIS — R000 Tachycardia, unspecified: Secondary | ICD-10-CM

## 2021-07-02 DIAGNOSIS — F060 Psychotic disorder with hallucinations due to known physiological condition: Secondary | ICD-10-CM

## 2021-07-02 LAB — ECHOCARDIOGRAM COMPLETE
AR max vel: 2.18 cm2
AV Area VTI: 2.1 cm2
AV Area mean vel: 2.1 cm2
AV Mean grad: 2 mmHg
AV Peak grad: 4.3 mmHg
Ao pk vel: 1.04 m/s
Area-P 1/2: 4.49 cm2
Calc EF: 56.1 %
Height: 66 in
MV VTI: 1.77 cm2
S' Lateral: 3.6 cm
Single Plane A2C EF: 52.7 %
Single Plane A4C EF: 57 %

## 2021-07-02 LAB — HEMOGLOBIN A1C
Hgb A1c MFr Bld: 5.6 % (ref 4.8–5.6)
Mean Plasma Glucose: 114.02 mg/dL

## 2021-07-02 LAB — LIPID PANEL
Cholesterol: 236 mg/dL — ABNORMAL HIGH (ref 0–200)
HDL: 47 mg/dL (ref >40)
LDL Cholesterol: 170 mg/dL — ABNORMAL HIGH (ref 0–99)
Total CHOL/HDL Ratio: 5 RATIO
Triglycerides: 94 mg/dL (ref <150)
VLDL: 19 mg/dL (ref 0–40)

## 2021-07-02 LAB — LACTIC ACID, PLASMA: Lactic Acid, Venous: 1.2 mmol/L (ref 0.5–1.9)

## 2021-07-02 LAB — HIV ANTIBODY (ROUTINE TESTING W REFLEX): HIV Screen 4th Generation wRfx: NONREACTIVE

## 2021-07-02 LAB — TSH: TSH: 3.208 u[IU]/mL (ref 0.350–4.500)

## 2021-07-02 LAB — AMMONIA: Ammonia: 23 umol/L (ref 9–35)

## 2021-07-02 LAB — VITAMIN B12: Vitamin B-12: 837 pg/mL (ref 180–914)

## 2021-07-02 MED ORDER — ACETAMINOPHEN 325 MG PO TABS
650.0000 mg | ORAL_TABLET | Freq: Four times a day (QID) | ORAL | Status: DC | PRN
  Administered 2021-07-02: 650 mg via ORAL
  Filled 2021-07-02: qty 2

## 2021-07-02 MED ORDER — SODIUM CHLORIDE 0.9 % IV SOLN
1.0000 g | Freq: Every day | INTRAVENOUS | Status: DC
  Administered 2021-07-02 (×2): 1 g via INTRAVENOUS
  Filled 2021-07-02 (×2): qty 10

## 2021-07-02 MED ORDER — ASPIRIN 81 MG PO TBEC
81.0000 mg | DELAYED_RELEASE_TABLET | Freq: Every day | ORAL | Status: DC
  Administered 2021-07-02 – 2021-07-03 (×2): 81 mg via ORAL
  Filled 2021-07-02 (×2): qty 1

## 2021-07-02 MED ORDER — SODIUM CHLORIDE 0.9 % IV SOLN
INTRAVENOUS | Status: DC
Start: 2021-07-02 — End: 2021-07-02

## 2021-07-02 MED ORDER — ACETAMINOPHEN 650 MG RE SUPP: 650.0000 mg | Freq: Four times a day (QID) | RECTAL | Status: DC | PRN

## 2021-07-02 MED ORDER — IOHEXOL 350 MG/ML SOLN
75.0000 mL | Freq: Once | INTRAVENOUS | Status: AC | PRN
  Administered 2021-07-02: 75 mL via INTRAVENOUS

## 2021-07-02 MED ORDER — MUSCLE RUB 10-15 % EX CREA
TOPICAL_CREAM | CUTANEOUS | Status: DC | PRN
  Administered 2021-07-02: 1 via TOPICAL
  Filled 2021-07-02: qty 85

## 2021-07-02 MED ORDER — HALOPERIDOL LACTATE 5 MG/ML IJ SOLN
1.0000 mg | Freq: Three times a day (TID) | INTRAMUSCULAR | Status: DC | PRN
Start: 2021-07-02 — End: 2021-07-03

## 2021-07-02 MED ORDER — ENOXAPARIN SODIUM 40 MG/0.4ML IJ SOSY
40.0000 mg | PREFILLED_SYRINGE | INTRAMUSCULAR | Status: DC
  Administered 2021-07-02 – 2021-07-03 (×2): 40 mg via SUBCUTANEOUS
  Filled 2021-07-02 (×2): qty 0.4

## 2021-07-02 MED ORDER — HALOPERIDOL 0.5 MG PO TABS: 1.0000 mg | ORAL_TABLET | Freq: Three times a day (TID) | ORAL | Status: DC | PRN

## 2021-07-02 MED ORDER — FONDAPARINUX SODIUM 2.5 MG/0.5ML ~~LOC~~ SOLN: 2.5000 mg | SUBCUTANEOUS | Status: DC

## 2021-07-02 MED ORDER — QUETIAPINE FUMARATE 50 MG PO TABS
50.0000 mg | ORAL_TABLET | Freq: Every day | ORAL | Status: DC
  Administered 2021-07-02: 50 mg via ORAL
  Filled 2021-07-02: qty 1

## 2021-07-02 NOTE — Evaluation (Signed)
Physical Therapy Evaluation Patient Details Name: April Graves MRN: [id number] DOB: 02-03-1956 Today's Date: 07/02/2021  History of Present Illness  The pt is a 66 yo female presenting 5/27 with hallucinations and AMS. 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  PMH includes: anemia, decubitus ulcer of thigh, PE, chronic pain, arthritis, and being wheelchair-bound since fall in 2013.   Clinical Impression  Pt in bed upon arrival of PT, agreeable to evaluation at this time. Prior to admission the pt was completely dependent on assist from her children for all transfers and ADLs, reports she has not ambulated since ~2013 at time of initial fall and LE injury. The pt presents today declining offers for OOB mobility, transfers, or sitting EOB, and with significant chronic bilateral LE contractures. The pt has great support through her children, and has access to most needed DME, could benefit from an aide to reduce burden on her children but does not want one at this time. Given the pt is at her baseline level of mobility and has family assist available at home, recommend DME as listed below to reduce caregiver burden, but has no further acute PT needs.        Recommendations for follow up therapy are one component of a multi-disciplinary discharge planning process, led by the attending physician.  Recommendations may be updated based on patient status, additional functional criteria and insurance authorization.  Follow Up Recommendations No PT follow up    Assistance Recommended at Discharge Frequent or constant Supervision/Assistance  Patient can return home with the following  A lot of help with bathing/dressing/bathroom;A lot of help with walking and/or transfers;Assistance with cooking/housework;Assistance with feeding;Direct supervision/assist for medications management;Direct supervision/assist  for financial management;Assist for transportation;Help with stairs or ramp for entrance    Equipment Recommendations Hospital bed;Other (comment) (hoyer lift)  Recommendations for Other Services       Functional Status Assessment Patient has not had a recent decline in their functional status     Precautions / Restrictions Precautions Precautions: Fall Precaution Comments: WC/bed-bound at baseline, significant LE contractures Restrictions Weight Bearing Restrictions: No Other Position/Activity Restrictions: per family, pt has not been wt bearing on LE in "years"      Mobility  Bed Mobility Overal bed mobility: Needs Assistance Bed Mobility: Rolling Rolling: Total assist         General bed mobility comments: per son, pt can complete at home, unable to assist at eval    Transfers                   General transfer comment: pt declined offers          Pertinent Vitals/Pain Pain Assessment Pain Assessment: Faces Faces Pain Scale: Hurts even more Pain Location: LE with attempts at PROM Pain Descriptors / Indicators: Discomfort Pain Intervention(s): Limited activity within patient's tolerance, Monitored during session, Repositioned    Home Living Family/patient expects to be discharged to:: Private residence Living Arrangements: Children Available Help at Discharge: Available 24 hours/day;Family Type of Home: House Home Access: Ramped entrance;Level entry       Home Layout: Able to live on main level with bedroom/bathroom Home Equipment: Shower seat;Wheelchair - Education officer, community - power;Hospital bed Additional Comments: per son, the pt's home is very accessible, has a power WC and a manual WC, believes on is tilt-in-space. can wheel WC into shower, but sometimes family bathes in bed    Prior  Function Prior Level of Function : Needs assist       Physical Assist : Mobility (physical);ADLs (physical) Mobility (physical): Bed mobility;Transfers ADLs  (physical): Feeding;Grooming;Bathing;Dressing;Toileting;IADLs Mobility Comments: pt needing assist for all transfers and bed mobility. totalA provided by children for transfers, pt can assist some with bed mobiltiy. spends most of day in bed, tolerates WC for 20 min at a time then kids transfer her back to bed ADLs Comments: assist from children, pt can feed herself once set up, assist for bathing, dressing, toileting     Hand Dominance        Extremity/Trunk Assessment   Upper Extremity Assessment Upper Extremity Assessment: Defer to OT evaluation    Lower Extremity Assessment Lower Extremity Assessment: RLE deficits/detail;LLE deficits/detail RLE Deficits / Details: pt non-wt bearing at baseline, significant contractures, PF at ankle, unable to extend past ~80 deg knee flexion, unable to reach 90 deg hhip flexion. pt reports sensation is "different" but unable to clarify how. can wiggle ankle ~5 deg, pt states she does this as her leg exercise RLE Sensation: decreased light touch RLE Coordination: decreased fine motor;decreased gross motor LLE Deficits / Details: pt non-wt bearing at baseline, significant contractures, PF at ankle, unable to extend past ~70 deg knee flexion, unable to reach 90 deg hhip flexion. pt reports sensation is "different" but unable to clarify how. can wiggle ankle ~5 deg which pt states is her leg exercise LLE Sensation: decreased light touch;decreased proprioception LLE Coordination: decreased fine motor;decreased gross motor    Cervical / Trunk Assessment Cervical / Trunk Assessment: Kyphotic;Other exceptions Cervical / Trunk Exceptions: pt unable to tolerate attempts at sitting HOB past 30 deg.  Communication   Communication: No difficulties  Cognition Arousal/Alertness: Awake/alert Behavior During Therapy: WFL for tasks assessed/performed Overall Cognitive Status: History of cognitive impairments - at baseline                                           General Comments General comments (skin integrity, edema, etc.): VSS on RA at rest        Assessment/Plan    PT Assessment Patient does not need any further PT services  PT Problem List Decreased strength;Decreased range of motion;Decreased activity tolerance;Decreased balance;Decreased mobility;Decreased coordination       PT Treatment Interventions      PT Goals (Current goals can be found in the Care Plan section)  Acute Rehab PT Goals Patient Stated Goal: to get a new hospital bed PT Goal Formulation: All assessment and education complete, DC therapy         Co-evaluation PT/OT/SLP Co-Evaluation/Treatment: Yes Reason for Co-Treatment: Complexity of the patient's impairments (multi-system involvement);Necessary to address cognition/behavior during functional activity;For patient/therapist safety PT goals addressed during session: Mobility/safety with mobility;Strengthening/ROM         AM-PAC PT "6 Clicks" Mobility  Outcome Measure Help needed turning from your back to your side while in a flat bed without using bedrails?: Total Help needed moving from lying on your back to sitting on the side of a flat bed without using bedrails?: Total Help needed moving to and from a bed to a chair (including a wheelchair)?: Total Help needed standing up from a chair using your arms (e.g., wheelchair or bedside chair)?: Total Help needed to walk in hospital room?: Total Help needed climbing 3-5 steps with a railing? : Total 6 Click Score: 6  End of Session   Activity Tolerance: Other (comment) (pt declined offers of mobility) Patient left: in bed;with call bell/phone within reach;with bed alarm set;with family/visitor present Nurse Communication: Mobility status PT Visit Diagnosis: Other abnormalities of gait and mobility (R26.89);Muscle weakness (generalized) (M62.81)    Time: 1700-1749 (plus 1531-1541 (10 min)) PT Time Calculation (min) (ACUTE ONLY): 16  min   Charges:   PT Evaluation $PT Eval Low Complexity: 1 Low          West Carbo, PT, DPT   Acute Rehabilitation Department Pager #: [phone]  Sandra Cockayne 07/02/2021, 5:51 PM

## 2021-07-02 NOTE — Consult Note (Addendum)
[facility] Consult   Reason for Consult:  ''psychotic symptoms'' Referring Physician:  Vance Gather, MD Patient Identification: April Graves MRN:  [id number] Principal Diagnosis: AMS (altered mental status) Diagnosis:  Principal Problem:   AMS (altered mental status) Active Problems:   Sinus tachycardia   SIRS (systemic inflammatory response syndrome) ([place])   Left breast mass   Total Time spent with patient: 1 hour  Subjective:   April Graves is a 66 y.o. female patient admitted with altered mental status.  History is obtained from: Son, patient and chart review    ZJI:RCVELFY is a 66 y.o. 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 However, patient and his mother denies any past psychiatric history for the patient or known psychiatric disorders in the family. Both reports that patient was stable until 6-8 weeks ago when she developed full blow insomnia which later resulted into daytime agitation, irritability and nervousness. Patient was self medicating with Melatonin until it stopped to be effective. Few days ago, patient started having visual hallucinations where she reported seeing her neighbors in her room. Also, she also reports seeing those neighbors when she was in the ED. Son reports that patient was brought to the hospital for agitations and the above symptoms. He states that she responded well to Haldol at the emergency room but will appreciate if his mother can be placed on a medication that will help with agitation, psychosis and sleep. Per son, patient is wheelchair-bound and requires assistance to go to the bathroom, therefore wears diapers.She requires assistance with bathing and her food is prepared for her by family members-daughter lives with her. Today,  patient denies depression, delusions, anxiety, drug, alcohol abuse and self harming thoughts. Patient denies visual hallucinations today but says she saw the neighbor in her room last night.  Past Psychiatric History: none reported by the pt  Risk to Self:  denies Risk to Others:  denies Prior Inpatient Therapy:  none Prior Outpatient Therapy:  none  Past Medical History:  Past Medical History:  Diagnosis Date   Anemia of chronic disease    Chronic pain syndrome    Functional quadriplegia ([place])    noted in d/c summary 03/2015   History of decubitus ulcer    infected, left glut/hip   History of malnutrition    Osteoarthritis of both shoulders    Poor dentition    Secondary osteoarthritis of left ankle    Post-traumatic; at the tibiotalar joint.  + severe disuse osteopenia.    Past Surgical History:  Procedure Laterality Date   KNEE CLOSED REDUCTION  10/16/2011   No fracture.  Procedure: CLOSED MANIPULATION KNEE;  Surgeon: Mauri Pole, MD;  Location: [facility];  Service: Orthopedics;;  Proxmial Fibula   TUBAL LIGATION     Family History:  Family History  Problem Relation Age of Onset   Hypertension Other    Family Psychiatric  History: none reported Social History:  Social History   Substance and Sexual Activity  Alcohol Use No     Social History   Substance and Sexual Activity  Drug Use No    Social History   Socioeconomic History   Marital status: Single    Spouse name: Not on file   Number of children: Not on file   Years of education: Not on file  Highest education level: Not on file  Occupational History   Not on file  Tobacco Use   Smoking status: Never   Smokeless tobacco: Never  Substance and Sexual Activity   Alcohol use: No   Drug use: No   Sexual activity: Never  Other Topics Concern   Not on file  Social History Narrative   Not on file   Social Determinants of Health   Financial Resource Strain: Not on file  Food Insecurity: Not on file   Transportation Needs: Not on file  Physical Activity: Not on file  Stress: Not on file  Social Connections: Not on file   Additional Social History:    Allergies:   Allergies  Allergen Reactions   Codeine Anxiety and Other (See Comments)   Lisinopril Anxiety and Other (See Comments)    Heart Races    Pork-Derived Products     Patient reports she does not eat pork     Labs:  Results for orders placed or performed during the hospital encounter of 07/01/21 (from the past 48 hour(s))  Lactic acid, plasma     Status: None   Collection Time: 07/01/21  9:25 AM  Result Value Ref Range   Lactic Acid, Venous 1.1 0.5 - 1.9 mmol/L    Comment: Performed at [facility], [address], [place], [place] [address]  Comprehensive metabolic panel     Status: Abnormal   Collection Time: 07/01/21  9:25 AM  Result Value Ref Range   Sodium 136 135 - 145 mmol/L   Potassium 3.8 3.5 - 5.1 mmol/L   Chloride 105 98 - 111 mmol/L   CO2 22 22 - 32 mmol/L   Glucose, Bld 114 (H) 70 - 99 mg/dL    Comment: Glucose reference range applies only to samples taken after fasting for at least 8 hours.   BUN 9 8 - 23 mg/dL   Creatinine, Ser 0.41 (L) 0.44 - 1.00 mg/dL   Calcium 9.9 8.9 - 10.3 mg/dL   Total Protein 8.2 (H) 6.5 - 8.1 g/dL   Albumin 3.3 (L) 3.5 - 5.0 g/dL   AST 33 15 - 41 U/L   ALT 11 0 - 44 U/L   Alkaline Phosphatase 241 (H) 38 - 126 U/L   Total Bilirubin 0.8 0.3 - 1.2 mg/dL   GFR, Estimated >60 >60 mL/min    Comment: (NOTE) Calculated using the CKD-EPI Creatinine Equation (2021)    Anion gap 9 5 - 15    Comment: Performed at [facility], [place] [address], [place], [place] [address]  CBC with Differential     Status: None   Collection Time: 07/01/21  9:25 AM  Result Value Ref Range   WBC 5.6 4.0 - 10.5 K/uL   RBC 5.10 3.87 - 5.11 MIL/uL   Hemoglobin 14.1 12.0 - 15.0 g/dL   HCT 43.1 36.0 - 46.0 %   MCV 84.5 80.0 - 100.0 fL   MCH 27.6 26.0 - 34.0 pg   MCHC 32.7 30.0 - 36.0  g/dL   RDW 12.8 11.5 - 15.5 %   Platelets 349 150 - 400 K/uL   nRBC 0.0 0.0 - 0.2 %   Neutrophils Relative % 77 %   Neutro Abs 4.2 1.7 - 7.7 K/uL   Lymphocytes Relative 17 %   Lymphs Abs 1.0 0.7 - 4.0 K/uL   Monocytes Relative 6 %   Monocytes Absolute 0.4 0.1 - 1.0 K/uL   Eosinophils Relative 0 %  Eosinophils Absolute 0.0 0.0 - 0.5 K/uL   Basophils Relative 0 %   Basophils Absolute 0.0 0.0 - 0.1 K/uL   Immature Granulocytes 0 %   Abs Immature Granulocytes 0.02 0.00 - 0.07 K/uL    Comment: Performed at [facility], [place] [address], [place], [place] [address]  Protime-INR     Status: None   Collection Time: 07/01/21  9:25 AM  Result Value Ref Range   Prothrombin Time 15.0 11.4 - 15.2 seconds   INR 1.2 0.8 - 1.2    Comment: (NOTE) INR goal varies based on device and disease states. Performed at [facility], [place] [address], [place], [place] [address]   Blood Culture (routine x 2)     Status: None (Preliminary result)   Collection Time: 07/01/21  9:25 AM   Specimen: BLOOD RIGHT WRIST  Result Value Ref Range   Specimen Description BLOOD RIGHT WRIST    Special Requests      BOTTLES DRAWN AEROBIC AND ANAEROBIC Blood Culture adequate volume   Culture      NO GROWTH 1 DAY Performed at [facility], [place] [address] [place], [place] [address]    Report Status PENDING   Magnesium     Status: None   Collection Time: 07/01/21  9:25 AM  Result Value Ref Range   Magnesium 1.8 1.7 - 2.4 mg/dL    Comment: Performed at [facility] [address] [place], [place] [address]  SARS Coronavirus 2 by RT PCR (hospital order, performed in [facility] hospital lab) *cepheid single result test* Anterior Nasal Swab     Status: None   Collection Time: 07/01/21 10:42 AM   Specimen: Anterior Nasal Swab  Result Value Ref Range   SARS Coronavirus 2 by RT PCR NEGATIVE NEGATIVE    Comment: (NOTE) SARS-CoV-2 target nucleic acids are NOT DETECTED.  The SARS-CoV-2 RNA is  generally detectable in upper and lower respiratory specimens during the acute phase of infection. The lowest concentration of SARS-CoV-2 viral copies this assay can detect is 250 copies / mL. A negative result does not preclude SARS-CoV-2 infection and should not be used as the sole basis for treatment or other patient management decisions.  A negative result may occur with improper specimen collection / handling, submission of specimen other than nasopharyngeal swab, presence of viral mutation(s) within the areas targeted by this assay, and inadequate number of viral copies (<250 copies / mL). A negative result must be combined with clinical observations, patient history, and epidemiological information.  Fact Sheet for Patients:   https://www.patel.info/  Fact Sheet for Healthcare Providers: https://hall.com/  This test is not yet approved or  cleared by the [place] FDA and has been authorized for detection and/or diagnosis of SARS-CoV-2 by FDA under an Emergency Use Authorization (EUA).  This EUA will remain in effect (meaning this test can be used) for the duration of the COVID-19 declaration under Section 564(b)(1) of the Act, 21 U.S.C. section 360bbb-3(b)(1), unless the authorization is terminated or revoked sooner.  Performed at [facility], [place] [address] [place], [place] [address]   Urinalysis, Routine w reflex microscopic     Status: Abnormal   Collection Time: 07/01/21 11:09 AM  Result Value Ref Range   Color, Urine YELLOW YELLOW   APPearance CLEAR CLEAR   Specific Gravity, Urine 1.023 1.005 - 1.030   pH 5.0 5.0 - 8.0   Glucose, UA NEGATIVE NEGATIVE mg/dL  Hgb urine dipstick MODERATE (A) NEGATIVE   Bilirubin Urine NEGATIVE NEGATIVE   Ketones, ur 20 (A) NEGATIVE mg/dL   Protein, ur 30 (A) NEGATIVE mg/dL   Nitrite NEGATIVE NEGATIVE   Leukocytes,Ua SMALL (A) NEGATIVE   RBC / HPF 21-50 0 - 5 RBC/hpf   WBC, UA  11-20 0 - 5 WBC/hpf   Bacteria, UA NONE SEEN NONE SEEN   Squamous Epithelial / LPF 0-5 0 - 5    Comment: Performed at [facility], [place] [address], [place], [place] [address]  Blood Culture (routine x 2)     Status: None (Preliminary result)   Collection Time: 07/01/21 11:17 AM   Specimen: BLOOD LEFT HAND  Result Value Ref Range   Specimen Description BLOOD LEFT HAND    Special Requests      BOTTLES DRAWN AEROBIC AND ANAEROBIC Blood Culture results may not be optimal due to an inadequate volume of blood received in culture bottles   Culture      NO GROWTH 1 DAY Performed at [facility], [place] [address], [place], [place] [address]    Report Status PENDING   Urine Culture     Status: None (Preliminary result)   Collection Time: 07/01/21 11:21 AM   Specimen: In/Out Cath Urine  Result Value Ref Range   Specimen Description IN/OUT CATH URINE    Special Requests NONE    Culture      CULTURE REINCUBATED FOR BETTER GROWTH Performed at [facility], [place] [address], [place], [place] [address]    Report Status PENDING   HIV Antibody (routine testing w rflx)     Status: None   Collection Time: 07/02/21  1:40 AM  Result Value Ref Range   HIV Screen 4th Generation wRfx Non Reactive Non Reactive    Comment: Performed at [facility], [place] [address], [place], [place] [address]  TSH     Status: None   Collection Time: 07/02/21  1:40 AM  Result Value Ref Range   TSH 3.208 0.350 - 4.500 uIU/mL    Comment: Performed by a 3rd Generation assay with a functional sensitivity of <=0.01 uIU/mL. Performed at [facility], [place] [address], [place], [place] [address]   Vitamin B12     Status: None   Collection Time: 07/02/21  1:40 AM  Result Value Ref Range   Vitamin B-12 837 180 - 914 pg/mL    Comment: (NOTE) This assay is not validated for testing neonatal or myeloproliferative syndrome specimens for Vitamin B12 levels. Performed at [facility], [place] [address], [place], [place] [address]   Ammonia     Status: None   Collection Time: 07/02/21  1:40 AM  Result Value Ref Range   Ammonia 23 9 - 35 umol/L    Comment: Performed at [facility], [place] [address] [place], [place] [address]  Lactic acid, plasma     Status: None   Collection Time: 07/02/21  1:40 AM  Result Value Ref Range   Lactic Acid, Venous 1.2 0.5 - 1.9 mmol/L    Comment: Performed at [facility] [address], [place], [place] [address]  Hemoglobin A1c     Status: None   Collection Time: 07/02/21  1:48 AM  Result Value Ref Range   Hgb A1c MFr Bld 5.6 4.8 - 5.6 %    Comment: (NOTE) Pre diabetes:  5.7%-6.4%  Diabetes:              >6.4%  Glycemic control for   <7.0% adults with diabetes    Mean Plasma Glucose 114.02 mg/dL    Comment: Performed at [facility] [address] [place], [place] [address]  Lipid panel     Status: Abnormal   Collection Time: 07/02/21  1:48 AM  Result Value Ref Range   Cholesterol 236 (H) 0 - 200 mg/dL   Triglycerides 94 <150 mg/dL   HDL 47 >40 mg/dL   Total CHOL/HDL Ratio 5.0 RATIO   VLDL 19 0 - 40 mg/dL   LDL Cholesterol 170 (H) 0 - 99 mg/dL    Comment:        Total Cholesterol/HDL:CHD Risk Coronary Heart Disease Risk Table                     Men   Women  1/2 Average Risk   3.4   3.3  Average Risk       5.0   4.4  2 X Average Risk   9.6   7.1  3 X Average Risk  23.4   11.0        Use the calculated Patient Ratio above and the CHD Risk Table to determine the patient's CHD Risk.        ATP III CLASSIFICATION (LDL):  <100     mg/dL   Optimal  100-129  mg/dL   Near or Above                    Optimal  130-159  mg/dL   Borderline  160-189  mg/dL   High  >190     mg/dL   Very High Performed at [facility] [address] [place], [place] [address]     Current Facility-Administered Medications  Medication Dose Route Frequency Provider Last Rate Last Admin   acetaminophen (TYLENOL) tablet 650  mg  650 mg Oral Q6H PRN Shela Leff, MD       Or   acetaminophen (TYLENOL) suppository 650 mg  650 mg Rectal Q6H PRN Shela Leff, MD       aspirin EC tablet 81 mg  81 mg Oral Daily Bhagat, Srishti L, MD   81 mg at 07/02/21 0806   cefTRIAXone (ROCEPHIN) 1 g in sodium chloride 0.9 % 100 mL IVPB  1 g Intravenous QHS Shela Leff, MD   Stopped at 07/02/21 0453   enoxaparin (LOVENOX) injection 40 mg  40 mg Subcutaneous Q24H Shela Leff, MD   40 mg at 07/02/21 7681   haloperidol (HALDOL) tablet 1 mg  1 mg Oral Q8H PRN Christe Tellez, MD       Or   haloperidol lactate (HALDOL) injection 1 mg  1 mg Intramuscular Q8H PRN Rigby Swamy, MD       Muscle Rub CREA   Topical PRN Patrecia Pour, MD       QUEtiapine (SEROQUEL) tablet 50 mg  50 mg Oral QHS Corena Pilgrim, MD        Musculoskeletal: Strength & Muscle Tone: within normal limits Gait & Station: unsteady Patient leans: N/A   Psychiatric Specialty Exam:  Presentation  General Appearance: Appropriate for Environment  Eye Contact:Good  Speech:Clear and Coherent  Speech Volume:Normal  Handedness:Right   Mood and Affect  Mood:Dysphoric  Affect:Appropriate   Thought Process  Thought Processes:Coherent  Descriptions of Associations:Intact  Orientation:Full (Time, Place  and Person)  Thought Content:Logical  History of Schizophrenia/Schizoaffective disorder:No data recorded Duration of Psychotic Symptoms:No data recorded Hallucinations:Hallucinations: Visual  Ideas of Reference:None  Suicidal Thoughts:Suicidal Thoughts: No  Homicidal Thoughts:Homicidal Thoughts: No   Sensorium  Memory:Immediate Fair; Recent Fair; Remote Fair  Judgment:Fair  Insight:Fair   Executive Functions  Concentration:Fair  Attention Span:Good  [place]  Language:Good   Psychomotor Activity  Psychomotor Activity:Psychomotor Activity: Normal   Assets  Assets:Desire  for Improvement   Sleep  Sleep:Sleep: Poor   Physical Exam: Physical Exam Review of Systems  Psychiatric/Behavioral:  Positive for hallucinations.   Blood pressure (!) 155/88, pulse 97, temperature 98.2 F (36.8 C), temperature source Oral, resp. rate 16, height '5\' 6"'$  (1.676 m), SpO2 100 %. Body mass index is 22.6 kg/m.  Treatment Plan Summary: 66 year old female with multiple medical issues who denies prior history of mental illness. She was admitted due to altered mental status and found to have visual hallucinations of recent onset. MRI done during admission is abnormal for which patient is being evaluated. At this point, the specific reason for patient's psychosis is not clear. It could be as a result of multiple reasons which include lack of sleep for many days, medical conditions, neurological issues etc. Patient and his son agreed to prophylactic treatment for insomnia, mood and psychosis pending the medical and neurological work up  Recommendations: -Add Seroquel 50 mg at bedtime for insomnia,psychosis and mood; increase Seroquel to 100 mg at bedtime if there no symptoms improvement after 2 days -Add Haloperidol 1 mg po/IM every 8hr as needed for agitation/psychosis  Disposition: No evidence of imminent risk to self or others at present.   Patient does not meet criteria for psychiatric inpatient admission. Supportive therapy provided about ongoing stressors. Psychiatric service is signing out. Re-consult as needed  Corena Pilgrim, MD 07/02/2021 4:27 PM

## 2021-07-02 NOTE — Evaluation (Signed)
Occupational Therapy Evaluation Patient Details Name: April Graves MRN: [id number] DOB: [birth year] Today's Date: 07/02/2021   History of Present Illness The pt is a 66 yo female presenting 5/27 with hallucinations and AMS. 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  PMH includes: anemia, decubitus ulcer of thigh, PE, chronic pain, arthritis, and being wheelchair-bound since fall in 2013.   Clinical Impression   Pt admitted for concerns listed above. PTA pt and family report that they provide total assist to her, as she has been bed bound/WC bound for years. Pt continues to be at her baseline, able to feed herself and complete most grooming tasks with set up. She has no further skilled OT needs, as she is at her baseline, and acute OT will sign off.       Recommendations for follow up therapy are one component of a multi-disciplinary discharge planning process, led by the attending physician.  Recommendations may be updated based on patient status, additional functional criteria and insurance authorization.   Follow Up Recommendations  No OT follow up    Assistance Recommended at Discharge Frequent or constant Supervision/Assistance  Patient can return home with the following Two people to help with walking and/or transfers;Two people to help with bathing/dressing/bathroom;Assistance with cooking/housework;Assistance with feeding;Direct supervision/assist for medications management;Direct supervision/assist for financial management;Assist for transportation;Help with stairs or ramp for entrance    Functional Status Assessment  Patient has not had a recent decline in their functional status  Equipment Recommendations  Other (comment);Hospital bed (hoyer lift)    Recommendations for Other Services       Precautions / Restrictions Precautions Precautions: Fall Precaution Comments:  WC/bed-bound at baseline, significant LE contractures Restrictions Weight Bearing Restrictions: No Other Position/Activity Restrictions: per family, pt has not been wt bearing on LE in "years"      Mobility Bed Mobility Overal bed mobility: Needs Assistance Bed Mobility: Rolling Rolling: Total assist         General bed mobility comments: per son, pt can complete at home, unable to assist at eval    Transfers                   General transfer comment: pt declined offers      Balance                                           ADL either performed or assessed with clinical judgement   ADL Overall ADL's : At baseline;Needs assistance/impaired                                       General ADL Comments: Pt is total assist at baseline, able to mainly feed and groom herself with setup     Vision Baseline Vision/History: 1 Wears glasses Ability to See in Adequate Light: 1 Impaired Patient Visual Report: No change from baseline Vision Assessment?: No apparent visual deficits     Perception     Praxis      Pertinent Vitals/Pain Pain Assessment Pain Assessment: Faces Faces Pain Scale: Hurts even more Pain Location: LE with attempts at PROM Pain Descriptors / Indicators: Discomfort Pain Intervention(s): Limited activity within patient's tolerance, Monitored during session, Repositioned  Hand Dominance     Extremity/Trunk Assessment Upper Extremity Assessment Upper Extremity Assessment: RUE deficits/detail;LUE deficits/detail RUE Deficits / Details: Very limited ROM, able to bring hand to mouth only, unable to tolerate PROM of shoulders. RUE Sensation: decreased light touch;decreased proprioception RUE Coordination: decreased fine motor;decreased gross motor LUE Deficits / Details: Very limited ROM, able to bring hand to mouth only, unable to tolerate PROM of shoulders. LUE Sensation: decreased light touch;decreased  proprioception LUE Coordination: decreased fine motor;decreased gross motor   Lower Extremity Assessment Lower Extremity Assessment: Defer to PT evaluation RLE Deficits / Details: pt non-wt bearing at baseline, significant contractures, PF at ankle, unable to extend past ~80 deg knee flexion, unable to reach 90 deg hhip flexion. pt reports sensation is "different" but unable to clarify how. can wiggle ankle ~5 deg, pt states she does this as her leg exercise RLE Sensation: decreased light touch RLE Coordination: decreased fine motor;decreased gross motor LLE Deficits / Details: pt non-wt bearing at baseline, significant contractures, PF at ankle, unable to extend past ~70 deg knee flexion, unable to reach 90 deg hhip flexion. pt reports sensation is "different" but unable to clarify how. can wiggle ankle ~5 deg which pt states is her leg exercise LLE Sensation: decreased light touch;decreased proprioception LLE Coordination: decreased fine motor;decreased gross motor   Cervical / Trunk Assessment Cervical / Trunk Assessment: Kyphotic;Other exceptions Cervical / Trunk Exceptions: pt unable to tolerate attempts at sitting HOB past 30 deg.   Communication Communication Communication: No difficulties   Cognition Arousal/Alertness: Awake/alert Behavior During Therapy: WFL for tasks assessed/performed Overall Cognitive Status: History of cognitive impairments - at baseline                                       General Comments  VSS on RA    Exercises     Shoulder Instructions      Home Living Family/patient expects to be discharged to:: Private residence Living Arrangements: Children Available Help at Discharge: Available 24 hours/day;Family Type of Home: House Home Access: Ramped entrance;Level entry     Home Layout: Able to live on main level with bedroom/bathroom     Bathroom Shower/Tub: Astronomer Accessibility: Yes   Home Equipment:  Shower seat;Wheelchair - Education officer, community - power;Hospital bed   Additional Comments: per son, the pt's home is very accessible, has a power WC and a manual WC, believes on is tilt-in-space. can wheel WC into shower, but sometimes family bathes in bed      Prior Functioning/Environment Prior Level of Function : Needs assist       Physical Assist : Mobility (physical);ADLs (physical) Mobility (physical): Bed mobility;Transfers ADLs (physical): Feeding;Grooming;Bathing;Dressing;Toileting;IADLs Mobility Comments: pt needing assist for all transfers and bed mobility. totalA provided by children for transfers, pt can assist some with bed mobiltiy. spends most of day in bed, tolerates WC for 20 min at a time then kids transfer her back to bed ADLs Comments: assist from children, pt can feed herself once set up, assist for bathing, dressing, toileting        OT Problem List: Decreased strength;Decreased range of motion;Decreased activity tolerance;Impaired balance (sitting and/or standing);Decreased coordination;Decreased cognition;Decreased safety awareness;Decreased knowledge of use of DME or AE;Impaired sensation;Impaired tone;Impaired UE functional use;Pain      OT Treatment/Interventions:      OT Goals(Current goals can be found in the care plan section)  Acute Rehab OT Goals Patient Stated Goal: To go home OT Goal Formulation: With patient Time For Goal Achievement: 07/16/21 Potential to Achieve Goals: Fair  OT Frequency:      Co-evaluation PT/OT/SLP Co-Evaluation/Treatment: Yes Reason for Co-Treatment: Complexity of the patient's impairments (multi-system involvement);Necessary to address cognition/behavior during functional activity;To address functional/ADL transfers PT goals addressed during session: Mobility/safety with mobility;Strengthening/ROM OT goals addressed during session: Strengthening/ROM;ADL's and self-care      AM-PAC OT "6 Clicks" Daily Activity     Outcome  Measure Help from another person eating meals?: A Little Help from another person taking care of personal grooming?: A Little Help from another person toileting, which includes using toliet, bedpan, or urinal?: Total Help from another person bathing (including washing, rinsing, drying)?: Total Help from another person to put on and taking off regular upper body clothing?: Total Help from another person to put on and taking off regular lower body clothing?: Total 6 Click Score: 10   End of Session Nurse Communication: Mobility status  Activity Tolerance: Patient tolerated treatment well Patient left: in bed;with call bell/phone within reach;with bed alarm set;with family/visitor present  OT Visit Diagnosis: Other abnormalities of gait and mobility (R26.89);Muscle weakness (generalized) (M62.81);Other symptoms and signs involving cognitive function                Time: 0076-2263 OT Time Calculation (min): 19 min Charges:  OT General Charges $OT Visit: 1 Visit OT Evaluation $OT Eval Low Complexity: [place]., OTR/L Acute Rehabilitation  Naleah Kofoed Elane Yolanda Bonine 07/02/2021, 6:09 PM

## 2021-07-02 NOTE — Progress Notes (Signed)
EEG complete - results pending 

## 2021-07-02 NOTE — Procedures (Signed)
Routine EEG Report  NICLOE FRONTERA is a 66 y.o. female with a history of seizures who is undergoing an EEG to evaluate for seizures.  Report: This EEG was acquired with electrodes placed according to the International 10-20 electrode system (including Fp1, Fp2, F3, F4, C3, C4, P3, P4, O1, O2, T3, T4, T5, T6, A1, A2, Fz, Cz, Pz). The following electrodes were missing or displaced: none.  The occipital dominant rhythm was 8.5 Hz with overriding beta frequencies and intermittent mild diffuse slowing. This activity is reactive to stimulation. Drowsiness was manifested by background fragmentation; deeper stages of sleep were identified by K complexes and sleep spindles. There was no focal slowing. There were no interictal epileptiform discharges. There were no electrographic seizures identified. There was no abnormal response to photic stimulation. Hyperventilation was not performed.   Impression and clinical correlation: This EEG was obtained while awake and asleep and is abnormal due to intermittent mild diffuse slowing indicative of global cerebral dysfunction. Epileptiform abnormalities were not seen during this recording.  Su Monks, MD [facility] [phone]  If 7pm- 7am, please page neurology on call as listed in [place].

## 2021-07-02 NOTE — Progress Notes (Signed)
Progress Note  Patient: April Graves CBU:[id number] DOB: 02-Mar-1955  DOA: 07/01/2021  DOS: 07/02/2021    Brief hospital course: HPI: April Graves is a 66 y.o. female with medical history significant of functional quadriplegia/immobility since 2013, osteoarthritis, chronic pain syndrome, history of PE (Jan 2019) not longer on anticoagulation, history of left gluteus/hip decubitus ulcer, left breast mass discovered on CT done in September 2022 at [facility]. Seen at [facility] ED in March 2023 for altered mental status and was treated for UTI. She was brought by EMS from home to the ED on 5/27 due to confusion, hallucinations, and insomnia worsening over the previous month.   She had temperature of 100F, WBC 5.6k, SCr 0.4, Alk Phos 241 (chronically elevated), remainder of LFTs normal.  Lactic acid normal. Pyuria noted on urine micro, cultures sent and ceftriaxone started empirically.   CTA chest (performed for tachycardia) negative for PE but showed left breast mass (8.9 x 7.9 x 7.2 cm) with evidence of pectoralis muscle, chest wall, and skin invasion consistent with breast cancer.    Brain MRI showed numerous calvarial lesions, some with adjacent mild dural thickening (DDx includes metastatic disease and fibrous dysplasia). There was also evidence of subacute infarcts in the left internal capsule and corona radiata. No evidence of intraparenchymal metastatic disease.  She was actively hallucinating in the ED, improved with haldol. She was admitted with neurology and psychiatry consulted.    Assessment and Plan: Acute-subacute encephalopathy with psychotic features: No neuroimaging findings suggesting inflammation. ?paraneoplastic process - MRI not completely explanatory. EEG nonspecifically slowed without seizure predisposition or activity detected. B12, TSH, ammonia wnl. RPR, B1 pending. - Psychiatry consulted, suspecting insomnia may be contributing in addition to other  factors. Recommended seroquel 48m qHS (can increase to 1065min 2 days if needed) and prn haldol to continue.  - Neurology recommendations appreciated.   Subacute right basal ganglia CVA:  - Start statin (LDL 170) - Echo pending - PT/OT signed off as pt is at her functional (dependent) baseline. - Antiplatelet Tx per neurology. ASA 8160mrdered.  Left breast mass: Almost certainly malignancy. - Certainly requires further work up which will be pursued on an outpatient basis.  UTI: Has pyuria and dysuria.  - Continue ceftriaxone, Cx pending (reincubated)  History of DVT: No longer on anticoagulation, though with suspected malignancy, will give VTE ppx at the least while here. No symptoms of DVT and no PE on CTA.   Subjective: Reports her neighbors were in the ED having sex and doing other things in front of her. Not currently aware of anyone else in the room. States left breast mass has been there for many months, unclear how long. Not painful.   Objective: Vitals:   07/02/21 0309 07/02/21 0723 07/02/21 1242 07/02/21 1620  BP: (!) 145/79 (!) 153/93 (!) 152/94 (!) 155/88  Pulse: 89 98 93 97  Resp: _0 Temp: 98.3 F (36.8 C) 98 F (36.7 C) 98.8 F (37.1 C) 98.2 F (36.8 C)  TempSrc:  Oral Oral Oral  SpO2: 100% 100% 100% 100%  Height:       Gen: Frail 65 3o. female in no distress Pulm: Nonlabored breathing room air. Clear CV: Regular rate and rhythm. No murmur, rub, or gallop. No JVD, no pitting dependent edema. GI: Abdomen soft, non-tender, non-distended, with normoactive bowel sounds.  Ext: Warm, well perfused. Skin: Hard rounded nontender mass without overlying skin changes/induration/erythema/ulceration involving essentially the entire breast. No other  rashes, lesions or ulcers on visualized skin. Neuro: Alert and oriented with diffuse weakness limiting exam, contractures noted with some movement in RUE and RLE, no new focal neurological deficits. Psych: Judgement  and insight appear marginal. Mood euthymic & affect congruent while not discussing hallucination. Not responding to internal stimuli.    Data Personally reviewed:  CBC: Recent Labs  Lab 07/01/21 0925  WBC 5.6  NEUTROABS 4.2  HGB 14.1  HCT 43.1  MCV 84.5  PLT 384   Basic Metabolic Panel: Recent Labs  Lab 07/01/21 0925  NA 136  K 3.8  CL 105  CO2 22  GLUCOSE 114*  BUN 9  CREATININE 0.41*  CALCIUM 9.9  MG 1.8   GFR: CrCl cannot be calculated (Unknown ideal weight.). Liver Function Tests: Recent Labs  Lab 07/01/21 0925  AST 33  ALT 11  ALKPHOS 241*  BILITOT 0.8  PROT 8.2*  ALBUMIN 3.3*   No results for input(s): LIPASE, AMYLASE in the last 168 hours. Recent Labs  Lab 07/02/21 0140  AMMONIA 23   Coagulation Profile: Recent Labs  Lab 07/01/21 0925  INR 1.2   Cardiac Enzymes: No results for input(s): CKTOTAL, CKMB, CKMBINDEX, TROPONINI in the last 168 hours. BNP (last 3 results) No results for input(s): PROBNP in the last 8760 hours. HbA1C: Recent Labs    07/02/21 0148  HGBA1C 5.6   CBG: No results for input(s): GLUCAP in the last 168 hours. Lipid Profile: Recent Labs    07/02/21 0148  CHOL 236*  HDL 47  LDLCALC 170*  TRIG 94  CHOLHDL 5.0   Thyroid Function Tests: Recent Labs    07/02/21 0140  TSH 3.208   Anemia Panel: Recent Labs    07/02/21 0140  VITAMINB12 837   Urine analysis:    Component Value Date/Time   COLORURINE YELLOW 07/01/2021 1109   APPEARANCEUR CLEAR 07/01/2021 1109   LABSPEC 1.023 07/01/2021 1109   PHURINE 5.0 07/01/2021 1109   GLUCOSEU NEGATIVE 07/01/2021 1109   HGBUR MODERATE (A) 07/01/2021 1109   BILIRUBINUR NEGATIVE 07/01/2021 1109   KETONESUR 20 (A) 07/01/2021 1109   PROTEINUR 30 (A) 07/01/2021 1109   UROBILINOGEN 1.0 10/22/2011 0845   NITRITE NEGATIVE 07/01/2021 1109   LEUKOCYTESUR SMALL (A) 07/01/2021 1109   Recent Results (from the past 240 hour(s))  Blood Culture (routine x 2)     Status: None  (Preliminary result)   Collection Time: 07/01/21  9:25 AM   Specimen: BLOOD RIGHT WRIST  Result Value Ref Range Status   Specimen Description BLOOD RIGHT WRIST  Final   Special Requests   Final    BOTTLES DRAWN AEROBIC AND ANAEROBIC Blood Culture adequate volume   Culture   Final    NO GROWTH 1 DAY Performed at [facility], [place] [address], [place], [place] [address]    Report Status PENDING  Incomplete  SARS Coronavirus 2 by RT PCR (hospital order, performed in [facility] hospital lab) *cepheid single result test* Anterior Nasal Swab     Status: None   Collection Time: 07/01/21 10:42 AM   Specimen: Anterior Nasal Swab  Result Value Ref Range Status   SARS Coronavirus 2 by RT PCR NEGATIVE NEGATIVE Final    Comment: (NOTE) SARS-CoV-2 target nucleic acids are NOT DETECTED.  The SARS-CoV-2 RNA is generally detectable in upper and lower respiratory specimens during the acute phase of infection. The lowest concentration of SARS-CoV-2 viral copies this assay can detect is 250 copies / mL. A negative  result does not preclude SARS-CoV-2 infection and should not be used as the sole basis for treatment or other patient management decisions.  A negative result may occur with improper specimen collection / handling, submission of specimen other than nasopharyngeal swab, presence of viral mutation(s) within the areas targeted by this assay, and inadequate number of viral copies (<250 copies / mL). A negative result must be combined with clinical observations, patient history, and epidemiological information.  Fact Sheet for Patients:   https://www.patel.info/  Fact Sheet for Healthcare Providers: https://hall.com/  This test is not yet approved or  cleared by the [place] FDA and has been authorized for detection and/or diagnosis of SARS-CoV-2 by FDA under an Emergency Use Authorization (EUA).  This EUA will remain in effect (meaning  this test can be used) for the duration of the COVID-19 declaration under Section 564(b)(1) of the Act, 21 U.S.C. section 360bbb-3(b)(1), unless the authorization is terminated or revoked sooner.  Performed at [facility], [place] [address], [place], [place] [address]   Blood Culture (routine x 2)     Status: None (Preliminary result)   Collection Time: 07/01/21 11:17 AM   Specimen: BLOOD LEFT HAND  Result Value Ref Range Status   Specimen Description BLOOD LEFT HAND  Final   Special Requests   Final    BOTTLES DRAWN AEROBIC AND ANAEROBIC Blood Culture results may not be optimal due to an inadequate volume of blood received in culture bottles   Culture   Final    NO GROWTH 1 DAY Performed at [facility], [place] [address], [place], [place] [address]    Report Status PENDING  Incomplete  Urine Culture     Status: None (Preliminary result)   Collection Time: 07/01/21 11:21 AM   Specimen: In/Out Cath Urine  Result Value Ref Range Status   Specimen Description IN/OUT CATH URINE  Final   Special Requests NONE  Final   Culture   Final    CULTURE REINCUBATED FOR BETTER GROWTH Performed at [facility], [place] [address] [place], [place] [address]    Report Status PENDING  Incomplete     CT ANGIO HEAD NECK W WO CM  Result Date: 07/02/2021 CLINICAL DATA:  Stroke, determine embolic source EXAM: CT ANGIOGRAPHY HEAD AND NECK TECHNIQUE: Multidetector CT imaging of the head and neck was performed using the standard protocol during bolus administration of intravenous contrast. Multiplanar CT image reconstructions and MIPs were obtained to evaluate the vascular anatomy. Carotid stenosis measurements (when applicable) are obtained utilizing NASCET criteria, using the distal internal carotid diameter as the denominator. RADIATION DOSE REDUCTION: This exam was performed according to the departmental dose-optimization program which includes automated exposure control, adjustment of the mA  and/or kV according to patient size and/or use of iterative reconstruction technique. CONTRAST:  23mL OMNIPAQUE IOHEXOL 350 MG/ML SOLN COMPARISON:  Brain MRI from yesterday FINDINGS: CT HEAD FINDINGS Brain: No evidence of acute infarction, hemorrhage, hydrocephalus, extra-axial collection or mass lesion/mass effect. Vascular: No hyperdense vessel or unexpected calcification. Skull: Extensive ground-glass opacification scratched extensive ground-glass bone lesion involving the calvarium, angioma Ling the majority of the bilateral calvarium, new from a comparison head CT in 2009. Sinuses: No acute finding Orbits: No acute finding Review of the MIP images confirms the above findings CTA NECK FINDINGS Aortic arch: No acute finding. Right carotid system: No stenosis, beading, or ulceration. Left carotid system: No stenosis, beading, or ulceration Vertebral arteries: No stenosis, beading, or ulceration Skeleton:  Calvarial findings noted above. Advanced cervical spine degeneration with multilevel ankylosis. The open C1-2 facets and C2-3 facets show advanced degeneration. Atlantooccipital non segmentation or ankylosis. Other neck: No acute finding Upper chest: No acute finding Review of the MIP images confirms the above findings CTA HEAD FINDINGS Anterior circulation: Moderately extensive atheromatous irregularity of carotid branches. No flow limiting stenosis or major branch occlusion. Negative for aneurysm Posterior circulation: The vertebral and basilar arteries are diffusely patent. Generalized atheromatous irregularity of PCA branches without proximal flow limiting stenosis or aneurysm. Venous sinuses: Diffusely patent Anatomic variants: Negative Review of the MIP images confirms the above findings IMPRESSION: 1. No emergent vascular finding. 2. Atherosclerosis without flow limiting stenosis of major vessels. 3. Diffuse ground-glass and sclerotic appearance of the calvarium which is new from 2009. Metastatic disease is  of primary concern in the setting of breast mass. Fibrous dysplasia or Paget's disease are differential considerations. 4. 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 Electronically Signed   By: Jorje Guild M.D.   On: 07/02/2021 12:16   DG Chest 1 View  Result Date: 07/01/2021 CLINICAL DATA:  Altered mental status EXAM: CHEST  1 VIEW COMPARISON:  05/02/2021 FINDINGS: Low volume chest with interstitial crowding and bands of opacity. There is no edema, consolidation, effusion, or pneumothorax. Normal heart size and mediastinal contours. Marked osteopenia, especially for age. Severe bilateral glenohumeral osteoarthritis. IMPRESSION: Chronic low volume chest with atelectatic type opacity. No acute finding. Electronically Signed   By: Jorje Guild M.D.   On: 07/01/2021 09:53   CT Head Wo Contrast  Result Date: 07/01/2021 CLINICAL DATA:  Mental status changes. EXAM: CT HEAD WITHOUT CONTRAST TECHNIQUE: Contiguous axial images were obtained from the base of the skull through the vertex without intravenous contrast. RADIATION DOSE REDUCTION: This exam was performed according to the departmental dose-optimization program which includes automated exposure control, adjustment of the mA and/or kV according to patient size and/or use of iterative reconstruction technique. COMPARISON:  05/02/2021 FINDINGS: Brain: There is no evidence for acute hemorrhage, hydrocephalus, mass lesion, or abnormal extra-axial fluid collection. No definite CT evidence for acute infarction. Patchy low attenuation in the deep hemispheric and periventricular white matter is nonspecific, but likely reflects chronic microvascular ischemic demyelination. Vascular: The Skull: Markedly heterogeneous mineralization with areas of focal osteopenia. Lucent lesion in the right frontal parietal region measures up to 2.8 cm with left frontal lesion measuring 4.3 cm. Sinuses/Orbits: Right  mastoid effusion is similar to prior. Chronic mucosal disease noted right maxillary sinus. Other: None. IMPRESSION: 1. No acute intracranial abnormality. 2. Similar markedly heterogeneous mineralization of the calvarium with areas of focal osteopenia. Lucent lesions in the right frontal parietal region and left frontal lobe are suspicious for metastatic disease. Consider MRI brain with and without contrast to further evaluate. 3. Right mastoid effusion again noted. 4. Chronic right maxillary sinusitis. Electronically Signed   By: Misty Stanley M.D.   On: 07/01/2021 08:54   CT Angio Chest PE W and/or Wo Contrast  Result Date: 07/01/2021 CLINICAL DATA:  Altered mental status. Clinical concern for pulmonary embolism. EXAM: CT ANGIOGRAPHY CHEST WITH CONTRAST TECHNIQUE: Multidetector CT imaging of the chest was performed using the standard protocol during bolus administration of intravenous contrast. Multiplanar CT image reconstructions and MIPs were obtained to evaluate the vascular anatomy. RADIATION DOSE REDUCTION: This exam was performed according to the departmental dose-optimization program which includes automated exposure control, adjustment of the mA and/or kV according to patient  size and/or use of iterative reconstruction technique. CONTRAST:  73m OMNIPAQUE IOHEXOL 350 MG/ML SOLN COMPARISON:  Chest CTA dated 03/01/2017. Chest radiograph obtained earlier today. FINDINGS: Cardiovascular: Normally opacified pulmonary arteries with no pulmonary arterial filling defects seen. Normal sized heart. Diffuse left ventricular wall thickening. Small pericardial effusion with a maximum thickness of 10 mm. Mediastinum/Nodes: No enlarged mediastinal, hilar, or axillary lymph nodes. Thyroid gland, trachea, and esophagus demonstrate no significant findings. Lungs/Pleura: Mild atelectasis on the right. Minimal atelectasis on the left. No lung nodules or pleural fluid seen. Upper Abdomen: Distended gallbladder. No  gallstones, wall thickening or pericholecystic fluid seen. The remainder of the upper abdomen is unremarkable. Musculoskeletal: Interval very large left breast mass measuring 8.9 x 7.2 cm on image number 87/5 and 7.9 cm in length on image number 33/10. This is invading the underlying pectoralis major muscle with probable chest wall invasion. No bone destruction seen. Edema in the adjacent breast tissues. This extends to the skin anteriorly with associated skin thickening. Severe bilateral shoulder degenerative changes, left-greater-than-right. Mild thoracic scoliosis. Mild cervicothoracic degenerative changes. No evidence of bony metastatic disease. Review of the MIP images confirms the above findings. IMPRESSION: 1. Very large left breast mass measuring 8.9 x 7.9 x 7.2 cm with evidence of pectoralis muscle, chest wall and skin invasion. There is associated edema in the adjacent breast parenchyma. This is highly suspicious for a primary breast cancer. Further evaluation with an outpatient bilateral diagnostic mammogram and left breast ultrasound is recommended. 2. No evidence of pulmonary embolism. 3. Mild right and minimal left lung atelectasis. 4. Severe bilateral shoulder degenerative changes. Electronically Signed   By: SClaudie ReveringM.D.   On: 07/01/2021 14:11   MR Brain W and Wo Contrast  Result Date: 07/01/2021 CLINICAL DATA:  Mental status change, hallucinations, metastatic disease evaluation EXAM: MRI HEAD WITHOUT AND WITH CONTRAST TECHNIQUE: Multiplanar, multiecho pulse sequences of the brain and surrounding structures were obtained without and with intravenous contrast. CONTRAST:  6.345mGADAVIST GADOBUTROL 1 MMOL/ML IV SOLN COMPARISON:  No prior MRI, correlation is made with CT head 07/01/2021 FINDINGS: Evaluation is somewhat limited by motion artifact. Brain: Faint, punctate foci of mildly increased signal on diffusion-weighted imaging, which do not have definite ADC correlates, in the posterior limb  of the left internal capsule and corona radiata (series 9, image 75, 87, and 88), possibly subacute infarcts, although some of these demonstrate slightly increased signal on the ADC and may represent T2 shine through. These are not associated with contrast enhancement No acute hemorrhage, mass, mass effect, or midline shift. No abnormal parenchymal enhancement. Scattered foci of hemosiderin deposition in the basal ganglia, thalami, cerebellum, and cerebral hemispheres, most likely sequela of prior hypertensive microhemorrhages. T2 hyperintense signal in the periventricular white matter and pons, likely the sequela of moderate chronic small vessel ischemic disease. Dural thickening and enhancement overlying the left frontal lobe (series 24, image 23 and series 23, image 20) and right frontoparietal region (series 25, image 7 and series 24, image 12), which are subjacent to calvarial lesions, described below. Additional dural thickening measuring up to 2.5 x 3.0 x 0.3 cm (series 25, image 7 and series 24, image 12 overlying the right frontoparietal lobes is seen subjacent to a right parietal calvarium T1 and T2 hypointense lesion that demonstrates areas of enhancement and measures up to 2.5 x 2.6 x 1.0 cm (AP x TR x CC) (series 25, image 6 and series 24, image 13). This also correlates with a lytic lesion on  the prior CT Vascular: Normal flow voids. Skull and upper cervical spine: Numerous calvarial lesions, the largest of which include a T1 and T2 hyperintense calvarial lesion with some contrast enhancement that measures 6.2 x 4.6 x 1.3 cm (series 25, image 16), and a T1 and T2 hypointense, faintly enhancing right parietal lesion that measures up to 2.5 x 2.6 x 1.0 cm (series 25, image 6), which correlate with lucent lesions on the same-day CT. Right frontal T1 and T2 hypointense lesion, with some faint enhancement, that measures 3.2 x 2.0 x 1.2 cm (series 25, image 9) and a right parietal T1 and T2 hypointense  lesion without significant contrast enhancement that measures 4.5 x 3.8 x 0.8 cm (series 23, image 18). These correlate with sclerotic lesions on the same-day CT. Additional smaller lesions are also seen. Sinuses/Orbits: Mucous retention cyst in the right maxillary sinus. No acute finding in the orbits. Fluid in the right-greater-than-left mastoid air cells. Other: None. IMPRESSION: 1. 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 Some of these lesions demonstrate mild dural thickening subjacent to them, which is likely reactive. Recommend follow-up in 1 year to assess stability. 2. Faint, punctate, likely subacute infarcts in the left internal capsule and corona radiata. 3. No evidence of intraparenchymal metastatic disease. These results were called by telephone at the time of interpretation on 07/01/2021 at 8:27 pm to provider BUTLER, who verbally acknowledged these results. Electronically Signed   By: Merilyn Baba M.D.   On: 07/01/2021 20:27   EEG adult  Result Date: 07/02/2021 Derek Jack, MD     07/02/2021  3:48 PM Routine EEG Report KRISLYNN GRONAU is a 66 y.o. female with a history of seizures who is undergoing an EEG to evaluate for seizures. Report: This EEG was acquired with electrodes placed according to the International 10-20 electrode system (including Fp1, Fp2, F3, F4, C3, C4, P3, P4, O1, O2, T3, T4, T5, T6, A1, A2, Fz, Cz, Pz). The following electrodes were missing or displaced: none. The occipital dominant rhythm was 8.5 Hz with overriding beta frequencies and intermittent mild diffuse slowing. This activity is reactive to stimulation. Drowsiness was manifested by background fragmentation; deeper stages of sleep were identified by K complexes and sleep spindles. There was no focal slowing. There were no interictal epileptiform discharges. There were no  electrographic seizures identified. There was no abnormal response to photic stimulation. Hyperventilation was not performed. Impression and clinical correlation: This EEG was obtained while awake and asleep and is abnormal due to intermittent mild diffuse slowing indicative of global cerebral dysfunction. Epileptiform abnormalities were not seen during this recording. Su Monks, MD [facility] [phone] If 7pm- 7am, please page neurology on call as listed in [place].   ECHOCARDIOGRAM COMPLETE  Result Date: 07/02/2021    ECHOCARDIOGRAM REPORT   Patient Name:   EVOLA HOLLIS Date of Exam: 07/02/2021 Medical Rec #:  [id number]       Height:       66.0 in Accession #:    [id number]      Weight:       140.0 lb Date of Birth:  Oct 09, 1955      BSA:          1.719 m Patient Age:    68 years        BP:  152/93 mmHg Patient Gender: F               HR:           85 bpm. Exam Location:  Inpatient Procedure: 2D Echo, Cardiac Doppler and Color Doppler Indications:    CVA  History:        Patient has prior history of Echocardiogram examinations, most                 recent 03/04/2017.  Sonographer:    Luisa Hart RDCS Referring Phys: [place]  1. Left ventricular ejection fraction, by estimation, is 60 to 65%. The left ventricle has normal function. The left ventricle has no regional wall motion abnormalities. There is mild left ventricular hypertrophy. Left ventricular diastolic parameters are consistent with Grade I diastolic dysfunction (impaired relaxation).  2. Right ventricular systolic function is normal. The right ventricular size is normal.  3. The pericardial effusion is posterior to the left ventricle.  4. The mitral valve is abnormal. No evidence of mitral valve regurgitation. No evidence of mitral stenosis.  5. The aortic valve is tricuspid. There is mild calcification of the aortic valve. There is mild thickening of the aortic valve. Aortic valve regurgitation is  not visualized. Aortic valve sclerosis is present, with no evidence of aortic valve stenosis.  6. The inferior vena cava is normal in size with greater than 50% respiratory variability, suggesting right atrial pressure of 3 mmHg. FINDINGS  Left Ventricle: Left ventricular ejection fraction, by estimation, is 60 to 65%. The left ventricle has normal function. The left ventricle has no regional wall motion abnormalities. The left ventricular internal cavity size was normal in size. There is  mild left ventricular hypertrophy. Left ventricular diastolic parameters are consistent with Grade I diastolic dysfunction (impaired relaxation). Right Ventricle: The right ventricular size is normal. No increase in right ventricular wall thickness. Right ventricular systolic function is normal. Left Atrium: Left atrial size was normal in size. Right Atrium: Right atrial size was normal in size. Pericardium: Trivial pericardial effusion is present. The pericardial effusion is posterior to the left ventricle. Mitral Valve: The mitral valve is abnormal. There is mild thickening of the mitral valve leaflet(s). There is mild calcification of the mitral valve leaflet(s). Mild mitral annular calcification. No evidence of mitral valve regurgitation. No evidence of mitral valve stenosis. MV peak gradient, 6.2 mmHg. The mean mitral valve gradient is 2.0 mmHg. Tricuspid Valve: The tricuspid valve is normal in structure. Tricuspid valve regurgitation is not demonstrated. No evidence of tricuspid stenosis. Aortic Valve: The aortic valve is tricuspid. There is mild calcification of the aortic valve. There is mild thickening of the aortic valve. Aortic valve regurgitation is not visualized. Aortic valve sclerosis is present, with no evidence of aortic valve stenosis. Aortic valve mean gradient measures 2.0 mmHg. Aortic valve peak gradient measures 4.3 mmHg. Aortic valve area, by VTI measures 2.10 cm. Pulmonic Valve: The pulmonic valve was  normal in structure. Pulmonic valve regurgitation is not visualized. No evidence of pulmonic stenosis. Aorta: The aortic root is normal in size and structure. Venous: The inferior vena cava is normal in size with greater than 50% respiratory variability, suggesting right atrial pressure of 3 mmHg. IAS/Shunts: No atrial level shunt detected by color flow Doppler.  LEFT VENTRICLE PLAX 2D LVIDd:         4.00 cm     Diastology LVIDs:         3.60 cm  LV e' medial:    3.50 cm/s LV PW:         1.00 cm     LV E/e' medial:  19.2 LV IVS:        1.20 cm     LV e' lateral:   6.62 cm/s LVOT diam:     1.90 cm     LV E/e' lateral: 10.2 LV SV:         41 LV SV Index:   24 LVOT Area:     2.84 cm  LV Volumes (MOD) LV vol d, MOD A2C: 51.8 ml LV vol d, MOD A4C: 52.3 ml LV vol s, MOD A2C: 24.5 ml LV vol s, MOD A4C: 22.5 ml LV SV MOD A2C:     27.3 ml LV SV MOD A4C:     52.3 ml LV SV MOD BP:      29.8 ml RIGHT VENTRICLE RV Basal diam:  2.60 cm RV Mid diam:    1.50 cm RV S prime:     11.90 cm/s TAPSE (M-mode): 1.6 cm LEFT ATRIUM             Index LA diam:        2.10 cm 1.22 cm/m LA Vol (A2C):   18.8 ml 10.94 ml/m LA Vol (A4C):   14.7 ml 8.55 ml/m LA Biplane Vol: 17.3 ml 10.07 ml/m  AORTIC VALVE                    PULMONIC VALVE AV Area (Vmax):    2.18 cm     PV Vmax:       0.77 m/s AV Area (Vmean):   2.10 cm     PV Vmean:      52.900 cm/s AV Area (VTI):     2.10 cm     PV VTI:        0.145 m AV Vmax:           104.00 cm/s  PV Peak grad:  2.4 mmHg AV Vmean:          71.300 cm/s  PV Mean grad:  1.0 mmHg AV VTI:            0.194 m AV Peak Grad:      4.3 mmHg AV Mean Grad:      2.0 mmHg LVOT Vmax:         80.00 cm/s LVOT Vmean:        52.900 cm/s LVOT VTI:          0.144 m LVOT/AV VTI ratio: 0.74  AORTA Ao Root diam: 3.10 cm Ao Asc diam:  3.20 cm MITRAL VALVE               TRICUSPID VALVE MV Area (PHT): 4.49 cm    TR Peak grad:   13.0 mmHg MV Area VTI:   1.77 cm    TR Vmax:        180.00 cm/s MV Peak grad:  6.2 mmHg MV Mean  grad:  2.0 mmHg    SHUNTS MV Vmax:       1.25 m/s    Systemic VTI:  0.14 m MV Vmean:      67.4 cm/s   Systemic Diam: 1.90 cm MV Decel Time: 169 msec MV E velocity: 67.20 cm/s MV A velocity: 96.50 cm/s MV E/A ratio:  0.70 Jenkins Rouge MD Electronically signed by Jenkins Rouge MD Signature Date/Time: 07/02/2021/2:16:04 PM    Final  Family Communication: None at bedside this AM  Disposition: Status is: Inpatient Remains inpatient appropriate because: Persistent AMS and CVA work up. Planned Discharge Destination: Home      Patrecia Pour, MD 07/02/2021 6:16 PM Page by [email or web address]

## 2021-07-03 LAB — RPR: RPR Ser Ql: NONREACTIVE

## 2021-07-03 MED ORDER — ATORVASTATIN CALCIUM 80 MG PO TABS
80.0000 mg | ORAL_TABLET | Freq: Every day | ORAL | Status: DC
  Administered 2021-07-03: 80 mg via ORAL
  Filled 2021-07-03: qty 1

## 2021-07-03 MED ORDER — ASPIRIN 81 MG PO CHEW: 81.0000 mg | CHEWABLE_TABLET | Freq: Every day | ORAL | 0 refills | Status: DC

## 2021-07-03 MED ORDER — CEPHALEXIN 500 MG PO CAPS: 500.0000 mg | ORAL_CAPSULE | Freq: Two times a day (BID) | ORAL | 0 refills | Status: AC

## 2021-07-03 MED ORDER — QUETIAPINE FUMARATE 50 MG PO TABS: 50.0000 mg | ORAL_TABLET | Freq: Every day | ORAL | 0 refills | Status: DC

## 2021-07-03 MED ORDER — ATORVASTATIN CALCIUM 40 MG PO TABS: 40.0000 mg | ORAL_TABLET | Freq: Every day | ORAL | 0 refills | Status: DC

## 2021-07-03 NOTE — TOC Transition Note (Signed)
Transition of Care [facility]) - CM/SW Discharge Note   Patient Details  Name: April Graves MRN: [id number] Date of Birth: 1955-03-09  [facility] CM/SW Contact:  Geralynn Ochs, LCSW Phone Number: 07/03/2021, 1:25 PM   Clinical Narrative:   Patient discharging home, no follow up needs. Patient will need PTAR transport, CSW scheduled for next available. No other needs identified.    Final next level of care: Home/Self Care Barriers to Discharge: Barriers Resolved   Patient Goals and CMS Choice Patient states their goals for this hospitalization and ongoing recovery are:: to get home CMS Medicare.gov Compare Post Acute Care list provided to:: Patient Choice offered to / list presented to : Patient  Discharge Placement                Patient to be transferred to facility by: Stuart Name of family member notified: Self Patient and family notified of of transfer: 07/03/21  Discharge Plan and Services                                     Social Determinants of Health (SDOH) Interventions     Readmission Risk Interventions     View : No data to display.

## 2021-07-03 NOTE — Plan of Care (Signed)
Patient is alert and oriented X4. No hallucination and agitation noted during the shift. Patient is comfortable and was able to verbalize her needs. Will continue to monitor.   Problem: Education: Goal: Knowledge of General Education information will improve Description: Including pain rating scale, medication(s)/side effects and non-pharmacologic comfort measures Outcome: Progressing   Problem: Clinical Measurements: Goal: Ability to maintain clinical measurements within normal limits will improve Outcome: Progressing   Problem: Activity: Goal: Risk for activity intolerance will decrease Outcome: Progressing   Problem: Nutrition: Goal: Adequate nutrition will be maintained Outcome: Progressing   Problem: Coping: Goal: Level of anxiety will decrease Outcome: Progressing   Problem: Pain Managment: Goal: General experience of comfort will improve Outcome: Progressing   Problem: Safety: Goal: Ability to remain free from injury will improve Outcome: Progressing   Problem: Skin Integrity: Goal: Risk for impaired skin integrity will decrease Outcome: Progressing

## 2021-07-03 NOTE — Progress Notes (Addendum)
Neurology Progress Note   S:// Patient is lying in bed in NAD. No family at bedside. Patient denies any visual hallucinations, tells me that it was real and not hallucinations and her neighbors were in her room while in the ED. She is alert and oriented x 4. Spoke to her about the need for an LP and she is refusing to proceed with the procedure. On exam with attending, will hold off on LP for now, since hallucinations are improving and paraneoplastic and autoimmune process is low on the differential LP can be done as outpatient if necessary. She wants to go home. No new neurological events overnight Daughter and son updated by Dr. Rory Percy at bedside. Patient to follow up with neurology, oncology and psychiatry as outpatient.   O:// Current vital signs: BP (!) 156/87 (BP Location: Right Arm)   Pulse 93   Temp 98.7 F (37.1 C) (Oral)   Resp 16   Ht '5\' 6"'$  (1.676 m)   SpO2 99%   BMI 22.60 kg/m  Vital signs in last 24 hours: Temp:  [98.2 F (36.8 C)-98.8 F (37.1 C)] 98.7 F (37.1 C) (05/29 0756) Pulse Rate:  [83-107] 93 (05/29 0756) Resp:  [16-20] 16 (05/29 0756) BP: (124-177)/(73-99) 156/87 (05/29 0756) SpO2:  [99 %-100 %] 99 % (05/29 0756)  GENERAL: Awake, alert in NAD HEENT: - edentulous, Normocephalic and atraumatic, dry mm LUNGS - Clear to auscultation bilaterally with no wheezes CV - S1S2 RRR, no m/r/g, equal pulses bilaterally. ABDOMEN - Soft, nontender, nondistended with normoactive BS Ext: contracted legs, warm, well perfused, intact peripheral pulses, no edema  NEURO:  Mental Status: AA&Ox4 Language: speech is clear  Naming, repetition, fluency, and comprehension intact. Cranial Nerves: PERRL 41m/brisk. EOMI, visual fields full, no facial asymmetry, facial sensation intact, hearing intact, tongue/uvula/soft palate midline, normal sternocleidomastoid and trapezius muscle strength. No evidence of tongue atrophy or fibrillations Motor: contracted extremities. Can grip  bilaterally, right is stronger than left. Can wiggle toes bilaterally  Tone: is normal and bulk is normal Sensation- Intact to light touch bilaterally Coordination: unable to assess Gait- deferred   Medications  Current Facility-Administered Medications:    acetaminophen (TYLENOL) tablet 650 mg, 650 mg, Oral, Q6H PRN, 650 mg at 07/02/21 2126 **OR** acetaminophen (TYLENOL) suppository 650 mg, 650 mg, Rectal, Q6H PRN, RShela Leff MD   aspirin EC tablet 81 mg, 81 mg, Oral, Daily, Bhagat, Srishti L, MD, 81 mg at 07/02/21 0806   cefTRIAXone (ROCEPHIN) 1 g in sodium chloride 0.9 % 100 mL IVPB, 1 g, Intravenous, QHS, Rathore, VWandra Feinstein MD, Last Rate: 200 mL/hr at 07/02/21 2126, 1 g at 07/02/21 2126   enoxaparin (LOVENOX) injection 40 mg, 40 mg, Subcutaneous, Q24H, Rathore, Vasundhra, MD, 40 mg at 07/03/21 09622  haloperidol (HALDOL) tablet 1 mg, 1 mg, Oral, Q8H PRN **OR** haloperidol lactate (HALDOL) injection 1 mg, 1 mg, Intramuscular, Q8H PRN, Akintayo, Mojeed, MD   Muscle Rub CREA, , Topical, PRN, GPatrecia Pour MD, 1 application. at 07/02/21 1644   QUEtiapine (SEROQUEL) tablet 50 mg, 50 mg, Oral, QHS, Akintayo, Mojeed, MD, 50 mg at 07/02/21 2127   Imaging I have reviewed images in epic and the results pertinent to this consultation are:  CT-scan of the brain 5/27: 1. No acute intracranial abnormality. 2. Similar markedly heterogeneous mineralization of the calvarium with areas of focal osteopenia. Lucent lesions in the right frontal parietal region and left frontal lobe are suspicious for metastatic disease. Consider MRI brain with and without contrast to  further evaluate. 3. Right mastoid effusion again noted. 4. Chronic right maxillary sinusitis.   CTA Head/ Neck 5/28: 1. No emergent vascular finding. 2. Atherosclerosis without flow limiting stenosis of major vessels. 3. Diffuse ground-glass and sclerotic appearance of the calvarium which is new from 2009. Metastatic disease is  of primary concern in the setting of breast mass. Fibrous dysplasia or Paget's disease are differential considerations. 4. 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  MRI examination of the brain 5/27: 1. 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 Some of these lesions demonstrate mild dural thickening subjacent to them, which is likely reactive. Recommend follow-up in 1 year to assess stability. 2. Faint, punctate, likely subacute infarcts in the left internal capsule and corona radiata. 3. No evidence of intraparenchymal metastatic disease.  Stroke workup: A1c 5.6, goal <7.0 LDL-c 170, goal < 70 Echo  1. Left ventricular ejection fraction, by estimation, is 60 to 65%. The  left ventricle has normal function. The left ventricle has no regional  wall motion abnormalities. There is mild left ventricular hypertrophy.  Left ventricular diastolic parameters  are consistent with Grade I diastolic dysfunction (impaired relaxation).   2. Right ventricular systolic function is normal. The right ventricular  size is normal.   3. The pericardial effusion is posterior to the left ventricle.   4. The mitral valve is abnormal. No evidence of mitral valve regurgitation. No evidence of mitral stenosis.   5. The aortic valve is tricuspid. There is mild calcification of the aortic valve. There is mild thickening of the aortic valve. Aortic valve regurgitation is not visualized. Aortic valve sclerosis is present, with no evidence of aortic valve stenosis.   6. The inferior vena cava is normal in size with greater than 50% respiratory variability, suggesting right atrial pressure of 3 mmHg.  EKG ST  LABS: TSH 3.208 Vit B12 837 Ammonia 23 Vit B1 pending  RPR negative  HIV nonreactive  A1c 5.6 LDL:  170 rEEG:  Impression and clinical correlation: This EEG was obtained while awake and asleep and is abnormal due to intermittent mild diffuse slowing indicative of global cerebral dysfunction. Epileptiform abnormalities were not seen during this recording.  Assessment:  LATAISHA COLAN is a 66 y.o. 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  She requires assistance with bathing and her food is prepared for her by family members.  However she is able to feed herself, she makes lists of what she wants from the grocery store, she can converse about current events and watches TV and reads books.  Cognitively he had no concerns about the patient until the end of April/March at that time her symptoms were attributed to her switching her regular cold medication that she takes as needed for minor colds.  She was brought to the ED in late March at which time the symptoms were attributed to [facility] and UTI and she  reportedly improved with treatment of UTI.     However her son describes a progressive decline in her cognitive status.  Mostly she has a paranoid hallucinations and is very concerned that the locks be checked frequently etc. her wheelchair-bound status limits her mobility but he has not noted that she is wandering in the wheelchair at night.     Son additionally notes frequent eye blinking and the patient startling easily to loud noises.  He also has some episodes of shaking primarily in the evenings.  It is unclear whether or not she is responsive  during the shaking episodes as they are brief and last less than 10 seconds.  They are currently happening 2-3 times a night and she is subsequently confused.  There was one episode associated with a tongue bite. Patient tells me shaking is just something related to prior injuries and not new or different.   Son reports the patient has been complaining of headaches that are worst in the early morning and at night after she lays down to go to sleep.  Headaches improve over the course of the day.  These have additionally been worsening.  She has not noted any vision difficulties. However, patient denies any significant headaches recently   Family she has been having fevers at home although the son does not note that she has had any weight loss.  He reports she was eating and drinking okay until the last 2 days prior to admission.  He denies any choking when she is eating.  He notes that she has had greatly reduced sleep but is not sleeping at night either, and attributes this to reduced sleep due to the headaches waking her up out of sleep. Patient denies any difficulty with her sleep and feels her eating and drinking are normal.   Subacute right punctate basal ganglia infarct  AMS   Recommendations: - Will hold off on obtaining LP for now, if symptoms persists can consider obtaining as outpatient to r/o autoimmune or paraneoplastic diseases.  -Continue '81mg'$  ASA, Lipitor 80. - Follow up with neurology, oncology and psychiatry as outpatient.  - Neurology will sign off. Please call with questions or concerns   Beulah Gandy DNP, ACNPC-AG     Attending Neurohospitalist Addendum Patient seen and examined with APP/Resident. Agree with the history and physical as documented above. Agree with the plan as documented, which I helped formulate. I have independently reviewed the chart, obtained history, review of systems and examined the patient.I have personally reviewed pertinent head/neck/spine imaging  (CT/MRI).  Patient with subacute decline in mentation and hallucinations.  LP would have been considered but her exam is very benign, also patient actively refuses LP but the family is able to redirect her.  At this time, given her extremely poor back anatomy and contractures in the left arm which made positioning difficult, I do not feel that I have enough to go with the push for a spinal tap. I suspect that she has underlying cognitive deficits which have worsened over time and now include hallucinations. She should follow-up with outpatient neurology. As for the strokes on the MRI-they are artifact versus subacute appearing strokes.  CT angiography head and neck and 2D echocardiogram are unremarkable.  A1c under goal.  LDL much higher than goal at 170.  Will recommend atorvastatin 80 in addition to the aspirin. Follow-up with outpatient neurology.  Follow-up with oncology. Plan discussed with Dr. Bonner Puna and patient's son at bedside.   Please feel free to call  with any questions.  -- Amie Portland, MD Neurologist [facility] Pager: [phone]

## 2021-07-03 NOTE — Plan of Care (Signed)
  Problem: Health Behavior/Discharge Planning: Goal: Ability to manage health-related needs will improve Outcome: Progressing   Problem: Clinical Measurements: Goal: Ability to maintain clinical measurements within normal limits will improve Outcome: Progressing Goal: Will remain free from infection Outcome: Progressing   Problem: Activity: Goal: Risk for activity intolerance will decrease Outcome: Progressing   

## 2021-07-03 NOTE — Discharge Summary (Signed)
Physician Discharge Summary   Patient: April Graves MRN: [id number] DOB: [birth year]  Admit date:     07/01/2021  Discharge date: 07/03/21  Discharge Physician: Patrecia Pour   PCP: Aura Dials, MD   Recommendations at discharge:  Follow up with PCP and/or psychiatry for continued management of mood disorder with hallucinations started on seroquel.  Monitor LFTs and lipids, consider augmenting statin (newly started this admission).  Follow up with neurology in 4-6 weeks. 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 female with medical history significant of functional quadriplegia/immobility since 2013, osteoarthritis, chronic pain syndrome, history of PE (Jan 2019) not longer on anticoagulation, history of left gluteus/hip decubitus ulcer, left breast mass discovered on CT done in September 2022 at [facility]. Seen at [facility] ED in March 2023 for altered mental status and was treated for UTI. She was brought by EMS from home to the ED on 5/27 due to confusion, hallucinations, and insomnia worsening over the previous month.    She had temperature of 100F, WBC 5.6k, SCr 0.4, Alk Phos 241 (chronically elevated), remainder of LFTs normal.  Lactic acid normal. Pyuria noted on urine micro, cultures sent and ceftriaxone started empirically.    CTA chest (performed for tachycardia) negative for PE but showed left breast mass (8.9 x 7.9 x 7.2 cm) with evidence of pectoralis muscle, chest wall, and skin invasion consistent with breast cancer.    Brain MRI showed numerous calvarial lesions, some with adjacent mild dural thickening (DDx  includes metastatic disease and fibrous dysplasia). There was also evidence of subacute infarcts in the left internal capsule and corona radiata. No evidence of intraparenchymal metastatic disease.   She was actively hallucinating in the ED, improved with haldol. She was admitted with neurology and psychiatry consulted.    With addition of seroquel she has had no further ongoing hallucinations and had an uneventful hospital stay once up on the medical floor. Work up and neurology recommendations summarized below. A referral has been placed to oncology after discussion with on-call MD to pursue mammography and biopsy as an outpatient.  Assessment and Plan: Acute-subacute encephalopathy with psychotic features: No neuroimaging findings suggesting inflammation. ?paraneoplastic process - MRI not completely explanatory. EEG nonspecifically slowed without seizure predisposition or activity detected. B12, TSH, RPR, ammonia wnl. B1 pending. - Psychiatry consulted, suspecting insomnia may be contributing in addition to other factors. Recommended seroquel 76m qHS (can increase to 1019min 2 days if needed). Has not required additional haldol. Will prescribed seroquel at discharge and recommend psychiatry follow up.   Subacute right basal ganglia CVA:  - Start statin (LDL 170) - Echo without CES, normal biventricular systolic function.  - PT/OT signed off as pt is at her functional (dependent) baseline. - Antiplatelet Tx per neurology. ASA 8119mrdered.   Left breast mass: Almost certainly malignancy. Has been known for many months. - Certainly requires further work up which will be pursued on an outpatient basis. Discussed with oncology on-call, Dr. KalIrene Limboent information to have schedulers reach out to patient for expedited follow up for mammography and biopsy. The importance of this was discussed with the patient and family.  UTI: Has pyuria and dysuria.  - Given ceftriaxone, will empirically  continue keflex 584m po BID. Urine Cx pending (reincubated and pending at discharge)   History of DVT: No longer on anticoagulation, though with suspected malignancy, will give VTE ppx at the least while here. No symptoms of DVT and no PE on CTA.   Consultants: Psychiatry, Neurology, Oncology Procedures performed: None  Disposition: Home Diet recommendation:  Cardiac diet DISCHARGE MEDICATION: Allergies as of 07/03/2021       Reactions   Codeine Anxiety, Other (See Comments)   Lisinopril Anxiety, Other (See Comments)   HCayuco   Patient reports she does not eat pork         Medication List     STOP taking these medications    feeding supplement (PRO-STAT SUGAR FREE 64) Liqd   fluconazole 200 MG tablet Commonly known as: DIFLUCAN   Rivaroxaban Stater Pack (15 mg and 20 mg) Commonly known as: XARELTO STARTER PACK       TAKE these medications    acetaminophen 325 MG tablet Commonly known as: TYLENOL Take 2 tablets (650 mg total) by mouth every 6 (six) hours as needed for mild pain (or Fever >/= 101).   albuterol 108 (90 Base) MCG/ACT inhaler Commonly known as: VENTOLIN HFA Inhale 1-2 puffs into the lungs every 6 (six) hours as needed for wheezing or shortness of breath.   aspirin 81 MG chewable tablet Chew 1 tablet (81 mg total) by mouth daily.   atorvastatin 40 MG tablet Commonly known as: LIPITOR Take 1 tablet (40 mg total) by mouth daily.   Biotene Dry Mouth Lozg Use as directed 1 tablet in the mouth or throat daily.   cephALEXin 500 MG capsule Commonly known as: KEFLEX Take 1 capsule (500 mg total) by mouth 2 (two) times daily for 5 days.   diclofenac Sodium 1 % Gel Commonly known as: VOLTAREN 2 g 4 (four) times daily as needed (pain).   erythromycin ophthalmic ointment Place into both eyes 4 (four) times daily. Place a 1/2 inch ribbon of ointment into the lower eyelid. What changed:  how much to take when to take  this   Lubricant Eye Drops 0.4-0.3 % Soln Generic drug: Polyethyl Glycol-Propyl Glycol Place 1 drop into both eyes daily as needed (eye lubricant).   Melatonin 5 MG Chew Chew 10 mg by mouth at bedtime.   multivitamin with minerals Tabs tablet Take 1 tablet by mouth daily.   oxyCODONE-acetaminophen 5-325 MG tablet Commonly known as: PERCOCET/ROXICET Take 2 tablets by mouth every 6 (six) hours as needed for moderate pain.   QUEtiapine 50 MG tablet Commonly known as: SEROQUEL Take 1 tablet (50 mg total) by mouth at bedtime.        Follow-up Information     TAura Dials MD Follow up.   Specialty: Family Medicine Contact information: [phone]         KBrunetta Genera MD Follow up.   Specialties: Hematology, Oncology Why: Not necessarily this oncologist, but with this office. Call in 2 days if not contacted to schedule appointment for the mass in the left breast. Contact information: [address] [place] [phone]               Discharge Exam: BP (!) 156/87 (BP Location: Right Arm)   Pulse 93   Temp 98.7 F (37.1 C) (Oral)   Resp  16   Ht _0  (1.676 m)   SpO2 99%   BMI 22.60 kg/m   Alert, oriented pleasant female recognizes me from yesterday, states she had restful sleep which is confirmed by RN. She is in no distress Clear, nonlabored Large hard palpable, nontender left breast mass without overlying skin changes or nipple discharge. Euthymic, does allude to hallucinations but not having any since she's been here and got haldol and seroquel.  Condition at discharge: stable  The results of significant diagnostics from this hospitalization (including imaging, microbiology, ancillary and laboratory) are listed below for reference.   Imaging Studies: CT ANGIO HEAD NECK W WO CM  Result Date: 07/02/2021 CLINICAL DATA:  Stroke, determine embolic source EXAM: CT ANGIOGRAPHY HEAD  AND NECK TECHNIQUE: Multidetector CT imaging of the head and neck was performed using the standard protocol during bolus administration of intravenous contrast. Multiplanar CT image reconstructions and MIPs were obtained to evaluate the vascular anatomy. Carotid stenosis measurements (when applicable) are obtained utilizing NASCET criteria, using the distal internal carotid diameter as the denominator. RADIATION DOSE REDUCTION: This exam was performed according to the departmental dose-optimization program which includes automated exposure control, adjustment of the mA and/or kV according to patient size and/or use of iterative reconstruction technique. CONTRAST:  48m OMNIPAQUE IOHEXOL 350 MG/ML SOLN COMPARISON:  Brain MRI from yesterday FINDINGS: CT HEAD FINDINGS Brain: No evidence of acute infarction, hemorrhage, hydrocephalus, extra-axial collection or mass lesion/mass effect. Vascular: No hyperdense vessel or unexpected calcification. Skull: Extensive ground-glass opacification scratched extensive ground-glass bone lesion involving the calvarium, angioma Ling the majority of the bilateral calvarium, new from a comparison head CT in 2009. Sinuses: No acute finding Orbits: No acute finding Review of the MIP images confirms the above findings CTA NECK FINDINGS Aortic arch: No acute finding. Right carotid system: No stenosis, beading, or ulceration. Left carotid system: No stenosis, beading, or ulceration Vertebral arteries: No stenosis, beading, or ulceration Skeleton: Calvarial findings noted above. Advanced cervical spine degeneration with multilevel ankylosis. The open C1-2 facets and C2-3 facets show advanced degeneration. Atlantooccipital non segmentation or ankylosis. Other neck: No acute finding Upper chest: No acute finding Review of the MIP images confirms the above findings CTA HEAD FINDINGS Anterior circulation: Moderately extensive atheromatous irregularity of carotid branches. No flow limiting  stenosis or major branch occlusion. Negative for aneurysm Posterior circulation: The vertebral and basilar arteries are diffusely patent. Generalized atheromatous irregularity of PCA branches without proximal flow limiting stenosis or aneurysm. Venous sinuses: Diffusely patent Anatomic variants: Negative Review of the MIP images confirms the above findings IMPRESSION: 1. No emergent vascular finding. 2. Atherosclerosis without flow limiting stenosis of major vessels. 3. Diffuse ground-glass and sclerotic appearance of the calvarium which is new from 2009. Metastatic disease is of primary concern in the setting of breast mass. Fibrous dysplasia or Paget's disease are differential considerations. 4. 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 Electronically Signed   By: JJorje GuildM.D.   On: 07/02/2021 12:16   DG Chest 1 View  Result Date: 07/01/2021 CLINICAL DATA:  Altered mental status EXAM: CHEST  1 VIEW COMPARISON:  05/02/2021 FINDINGS: Low volume chest with interstitial crowding and bands of opacity. There is no edema, consolidation, effusion, or pneumothorax. Normal heart size and mediastinal contours. Marked osteopenia, especially for age. Severe bilateral glenohumeral osteoarthritis. IMPRESSION: Chronic low volume chest with atelectatic type opacity. No acute finding. Electronically Signed   By: JGilford SilviusD.  On: 07/01/2021 09:53   CT Head Wo Contrast  Result Date: 07/01/2021 CLINICAL DATA:  Mental status changes. EXAM: CT HEAD WITHOUT CONTRAST TECHNIQUE: Contiguous axial images were obtained from the base of the skull through the vertex without intravenous contrast. RADIATION DOSE REDUCTION: This exam was performed according to the departmental dose-optimization program which includes automated exposure control, adjustment of the mA and/or kV according to patient size and/or use of iterative reconstruction technique.  COMPARISON:  05/02/2021 FINDINGS: Brain: There is no evidence for acute hemorrhage, hydrocephalus, mass lesion, or abnormal extra-axial fluid collection. No definite CT evidence for acute infarction. Patchy low attenuation in the deep hemispheric and periventricular white matter is nonspecific, but likely reflects chronic microvascular ischemic demyelination. Vascular: The Skull: Markedly heterogeneous mineralization with areas of focal osteopenia. Lucent lesion in the right frontal parietal region measures up to 2.8 cm with left frontal lesion measuring 4.3 cm. Sinuses/Orbits: Right mastoid effusion is similar to prior. Chronic mucosal disease noted right maxillary sinus. Other: None. IMPRESSION: 1. No acute intracranial abnormality. 2. Similar markedly heterogeneous mineralization of the calvarium with areas of focal osteopenia. Lucent lesions in the right frontal parietal region and left frontal lobe are suspicious for metastatic disease. Consider MRI brain with and without contrast to further evaluate. 3. Right mastoid effusion again noted. 4. Chronic right maxillary sinusitis. Electronically Signed   By: Misty Stanley M.D.   On: 07/01/2021 08:54   CT Angio Chest PE W and/or Wo Contrast  Result Date: 07/01/2021 CLINICAL DATA:  Altered mental status. Clinical concern for pulmonary embolism. EXAM: CT ANGIOGRAPHY CHEST WITH CONTRAST TECHNIQUE: Multidetector CT imaging of the chest was performed using the standard protocol during bolus administration of intravenous contrast. Multiplanar CT image reconstructions and MIPs were obtained to evaluate the vascular anatomy. RADIATION DOSE REDUCTION: This exam was performed according to the departmental dose-optimization program which includes automated exposure control, adjustment of the mA and/or kV according to patient size and/or use of iterative reconstruction technique. CONTRAST:  48m OMNIPAQUE IOHEXOL 350 MG/ML SOLN COMPARISON:  Chest CTA dated 03/01/2017. Chest  radiograph obtained earlier today. FINDINGS: Cardiovascular: Normally opacified pulmonary arteries with no pulmonary arterial filling defects seen. Normal sized heart. Diffuse left ventricular wall thickening. Small pericardial effusion with a maximum thickness of 10 mm. Mediastinum/Nodes: No enlarged mediastinal, hilar, or axillary lymph nodes. Thyroid gland, trachea, and esophagus demonstrate no significant findings. Lungs/Pleura: Mild atelectasis on the right. Minimal atelectasis on the left. No lung nodules or pleural fluid seen. Upper Abdomen: Distended gallbladder. No gallstones, wall thickening or pericholecystic fluid seen. The remainder of the upper abdomen is unremarkable. Musculoskeletal: Interval very large left breast mass measuring 8.9 x 7.2 cm on image number 87/5 and 7.9 cm in length on image number 33/10. This is invading the underlying pectoralis major muscle with probable chest wall invasion. No bone destruction seen. Edema in the adjacent breast tissues. This extends to the skin anteriorly with associated skin thickening. Severe bilateral shoulder degenerative changes, left-greater-than-right. Mild thoracic scoliosis. Mild cervicothoracic degenerative changes. No evidence of bony metastatic disease. Review of the MIP images confirms the above findings. IMPRESSION: 1. Very large left breast mass measuring 8.9 x 7.9 x 7.2 cm with evidence of pectoralis muscle, chest wall and skin invasion. There is associated edema in the adjacent breast parenchyma. This is highly suspicious for a primary breast cancer. Further evaluation with an outpatient bilateral diagnostic mammogram and left breast ultrasound is recommended. 2. No evidence of pulmonary embolism. 3. Mild right and minimal  left lung atelectasis. 4. Severe bilateral shoulder degenerative changes. Electronically Signed   By: Claudie Revering M.D.   On: 07/01/2021 14:11   MR Brain W and Wo Contrast  Result Date: 07/01/2021 CLINICAL DATA:  Mental  status change, hallucinations, metastatic disease evaluation EXAM: MRI HEAD WITHOUT AND WITH CONTRAST TECHNIQUE: Multiplanar, multiecho pulse sequences of the brain and surrounding structures were obtained without and with intravenous contrast. CONTRAST:  6.61m GADAVIST GADOBUTROL 1 MMOL/ML IV SOLN COMPARISON:  No prior MRI, correlation is made with CT head 07/01/2021 FINDINGS: Evaluation is somewhat limited by motion artifact. Brain: Faint, punctate foci of mildly increased signal on diffusion-weighted imaging, which do not have definite ADC correlates, in the posterior limb of the left internal capsule and corona radiata (series 9, image 75, 87, and 88), possibly subacute infarcts, although some of these demonstrate slightly increased signal on the ADC and may represent T2 shine through. These are not associated with contrast enhancement No acute hemorrhage, mass, mass effect, or midline shift. No abnormal parenchymal enhancement. Scattered foci of hemosiderin deposition in the basal ganglia, thalami, cerebellum, and cerebral hemispheres, most likely sequela of prior hypertensive microhemorrhages. T2 hyperintense signal in the periventricular white matter and pons, likely the sequela of moderate chronic small vessel ischemic disease. Dural thickening and enhancement overlying the left frontal lobe (series 24, image 23 and series 23, image 20) and right frontoparietal region (series 25, image 7 and series 24, image 12), which are subjacent to calvarial lesions, described below. Additional dural thickening measuring up to 2.5 x 3.0 x 0.3 cm (series 25, image 7 and series 24, image 12 overlying the right frontoparietal lobes is seen subjacent to a right parietal calvarium T1 and T2 hypointense lesion that demonstrates areas of enhancement and measures up to 2.5 x 2.6 x 1.0 cm (AP x TR x CC) (series 25, image 6 and series 24, image 13). This also correlates with a lytic lesion on the prior CT Vascular: Normal flow  voids. Skull and upper cervical spine: Numerous calvarial lesions, the largest of which include a T1 and T2 hyperintense calvarial lesion with some contrast enhancement that measures 6.2 x 4.6 x 1.3 cm (series 25, image 16), and a T1 and T2 hypointense, faintly enhancing right parietal lesion that measures up to 2.5 x 2.6 x 1.0 cm (series 25, image 6), which correlate with lucent lesions on the same-day CT. Right frontal T1 and T2 hypointense lesion, with some faint enhancement, that measures 3.2 x 2.0 x 1.2 cm (series 25, image 9) and a right parietal T1 and T2 hypointense lesion without significant contrast enhancement that measures 4.5 x 3.8 x 0.8 cm (series 23, image 18). These correlate with sclerotic lesions on the same-day CT. Additional smaller lesions are also seen. Sinuses/Orbits: Mucous retention cyst in the right maxillary sinus. No acute finding in the orbits. Fluid in the right-greater-than-left mastoid air cells. Other: None. IMPRESSION: 1. 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 Some of these lesions demonstrate mild dural thickening subjacent to them, which is likely reactive. Recommend follow-up in 1 year to assess stability. 2. Faint, punctate, likely subacute infarcts in the left internal capsule and corona radiata. 3. No evidence of intraparenchymal metastatic disease. These results were called by telephone at the time of interpretation on 07/01/2021 at 8:27 pm to provider BUTLER, who verbally acknowledged these results. Electronically Signed  By: Merilyn Baba M.D.   On: 07/01/2021 20:27   EEG adult  Result Date: 07/02/2021 Derek Jack, MD     07/02/2021  3:48 PM Routine EEG Report ZAMIRA HICKAM is a 66 y.o. female with a history of seizures who is undergoing an EEG to evaluate for seizures. Report: This EEG was acquired with electrodes  placed according to the International 10-20 electrode system (including Fp1, Fp2, F3, F4, C3, C4, P3, P4, O1, O2, T3, T4, T5, T6, A1, A2, Fz, Cz, Pz). The following electrodes were missing or displaced: none. The occipital dominant rhythm was 8.5 Hz with overriding beta frequencies and intermittent mild diffuse slowing. This activity is reactive to stimulation. Drowsiness was manifested by background fragmentation; deeper stages of sleep were identified by K complexes and sleep spindles. There was no focal slowing. There were no interictal epileptiform discharges. There were no electrographic seizures identified. There was no abnormal response to photic stimulation. Hyperventilation was not performed. Impression and clinical correlation: This EEG was obtained while awake and asleep and is abnormal due to intermittent mild diffuse slowing indicative of global cerebral dysfunction. Epileptiform abnormalities were not seen during this recording. Su Monks, MD [facility] [phone] If 7pm- 7am, please page neurology on call as listed in [facility].   ECHOCARDIOGRAM COMPLETE  Result Date: 07/02/2021    ECHOCARDIOGRAM REPORT   Patient Name:   MIRISSA LOPRESTI Date of Exam: 07/02/2021 Medical Rec #:  [id number]       Height:       66.0 in Accession #:    [id number]      Weight:       140.0 lb Date of Birth:  06/28/1955      BSA:          1.719 m Patient Age:    50 years        BP:           152/93 mmHg Patient Gender: F               HR:           85 bpm. Exam Location:  Inpatient Procedure: 2D Echo, Cardiac Doppler and Color Doppler Indications:    CVA  History:        Patient has prior history of Echocardiogram examinations, most                 recent 03/04/2017.  Sonographer:    Luisa Hart RDCS Referring Phys: [place]  1. Left ventricular ejection fraction, by estimation, is 60 to 65%. The left ventricle has normal function. The left ventricle has no regional wall motion  abnormalities. There is mild left ventricular hypertrophy. Left ventricular diastolic parameters are consistent with Grade I diastolic dysfunction (impaired relaxation).  2. Right ventricular systolic function is normal. The right ventricular size is normal.  3. The pericardial effusion is posterior to the left ventricle.  4. The mitral valve is abnormal. No evidence of mitral valve regurgitation. No evidence of mitral stenosis.  5. The aortic valve is tricuspid. There is mild calcification of the aortic valve. There is mild thickening of the aortic valve. Aortic valve regurgitation is not visualized. Aortic valve sclerosis is present, with no evidence of aortic valve stenosis.  6. The inferior vena cava is normal in size with greater than 50% respiratory variability, suggesting right atrial pressure of 3 mmHg. FINDINGS  Left Ventricle: Left ventricular ejection fraction, by estimation, is 60 to 65%.  The left ventricle has normal function. The left ventricle has no regional wall motion abnormalities. The left ventricular internal cavity size was normal in size. There is  mild left ventricular hypertrophy. Left ventricular diastolic parameters are consistent with Grade I diastolic dysfunction (impaired relaxation). Right Ventricle: The right ventricular size is normal. No increase in right ventricular wall thickness. Right ventricular systolic function is normal. Left Atrium: Left atrial size was normal in size. Right Atrium: Right atrial size was normal in size. Pericardium: Trivial pericardial effusion is present. The pericardial effusion is posterior to the left ventricle. Mitral Valve: The mitral valve is abnormal. There is mild thickening of the mitral valve leaflet(s). There is mild calcification of the mitral valve leaflet(s). Mild mitral annular calcification. No evidence of mitral valve regurgitation. No evidence of mitral valve stenosis. MV peak gradient, 6.2 mmHg. The mean mitral valve gradient is 2.0  mmHg. Tricuspid Valve: The tricuspid valve is normal in structure. Tricuspid valve regurgitation is not demonstrated. No evidence of tricuspid stenosis. Aortic Valve: The aortic valve is tricuspid. There is mild calcification of the aortic valve. There is mild thickening of the aortic valve. Aortic valve regurgitation is not visualized. Aortic valve sclerosis is present, with no evidence of aortic valve stenosis. Aortic valve mean gradient measures 2.0 mmHg. Aortic valve peak gradient measures 4.3 mmHg. Aortic valve area, by VTI measures 2.10 cm. Pulmonic Valve: The pulmonic valve was normal in structure. Pulmonic valve regurgitation is not visualized. No evidence of pulmonic stenosis. Aorta: The aortic root is normal in size and structure. Venous: The inferior vena cava is normal in size with greater than 50% respiratory variability, suggesting right atrial pressure of 3 mmHg. IAS/Shunts: No atrial level shunt detected by color flow Doppler.  LEFT VENTRICLE PLAX 2D LVIDd:         4.00 cm     Diastology LVIDs:         3.60 cm     LV e' medial:    3.50 cm/s LV PW:         1.00 cm     LV E/e' medial:  19.2 LV IVS:        1.20 cm     LV e' lateral:   6.62 cm/s LVOT diam:     1.90 cm     LV E/e' lateral: 10.2 LV SV:         41 LV SV Index:   24 LVOT Area:     2.84 cm  LV Volumes (MOD) LV vol d, MOD A2C: 51.8 ml LV vol d, MOD A4C: 52.3 ml LV vol s, MOD A2C: 24.5 ml LV vol s, MOD A4C: 22.5 ml LV SV MOD A2C:     27.3 ml LV SV MOD A4C:     52.3 ml LV SV MOD BP:      29.8 ml RIGHT VENTRICLE RV Basal diam:  2.60 cm RV Mid diam:    1.50 cm RV S prime:     11.90 cm/s TAPSE (M-mode): 1.6 cm LEFT ATRIUM             Index LA diam:        2.10 cm 1.22 cm/m LA Vol (A2C):   18.8 ml 10.94 ml/m LA Vol (A4C):   14.7 ml 8.55 ml/m LA Biplane Vol: 17.3 ml 10.07 ml/m  AORTIC VALVE                    PULMONIC VALVE AV Area (Vmax):  2.18 cm     PV Vmax:       0.77 m/s AV Area (Vmean):   2.10 cm     PV Vmean:      52.900 cm/s AV Area  (VTI):     2.10 cm     PV VTI:        0.145 m AV Vmax:           104.00 cm/s  PV Peak grad:  2.4 mmHg AV Vmean:          71.300 cm/s  PV Mean grad:  1.0 mmHg AV VTI:            0.194 m AV Peak Grad:      4.3 mmHg AV Mean Grad:      2.0 mmHg LVOT Vmax:         80.00 cm/s LVOT Vmean:        52.900 cm/s LVOT VTI:          0.144 m LVOT/AV VTI ratio: 0.74  AORTA Ao Root diam: 3.10 cm Ao Asc diam:  3.20 cm MITRAL VALVE               TRICUSPID VALVE MV Area (PHT): 4.49 cm    TR Peak grad:   13.0 mmHg MV Area VTI:   1.77 cm    TR Vmax:        180.00 cm/s MV Peak grad:  6.2 mmHg MV Mean grad:  2.0 mmHg    SHUNTS MV Vmax:       1.25 m/s    Systemic VTI:  0.14 m MV Vmean:      67.4 cm/s   Systemic Diam: 1.90 cm MV Decel Time: 169 msec MV E velocity: 67.20 cm/s MV A velocity: 96.50 cm/s MV E/A ratio:  0.70 Jenkins Rouge MD Electronically signed by Jenkins Rouge MD Signature Date/Time: 07/02/2021/2:16:04 PM    Final     Microbiology: Results for orders placed or performed during the hospital encounter of 07/01/21  Blood Culture (routine x 2)     Status: None (Preliminary result)   Collection Time: 07/01/21  9:25 AM   Specimen: BLOOD RIGHT WRIST  Result Value Ref Range Status   Specimen Description BLOOD RIGHT WRIST  Final   Special Requests   Final    BOTTLES DRAWN AEROBIC AND ANAEROBIC Blood Culture adequate volume   Culture   Final    NO GROWTH 2 DAYS Performed at [facility], [address] [place], [place] [address]    Report Status PENDING  Incomplete  SARS Coronavirus 2 by RT PCR (hospital order, performed in [facility] hospital lab) *cepheid single result test* Anterior Nasal Swab     Status: None   Collection Time: 07/01/21 10:42 AM   Specimen: Anterior Nasal Swab  Result Value Ref Range Status   SARS Coronavirus 2 by RT PCR NEGATIVE NEGATIVE Final    Comment: (NOTE) SARS-CoV-2 target nucleic acids are NOT DETECTED.  The SARS-CoV-2 RNA is generally detectable in upper and  lower respiratory specimens during the acute phase of infection. The lowest concentration of SARS-CoV-2 viral copies this assay can detect is 250 copies / mL. A negative result does not preclude SARS-CoV-2 infection and should not be used as the sole basis for treatment or other patient management decisions.  A negative result may occur with improper specimen collection / handling, submission of specimen other than nasopharyngeal swab, presence of viral mutation(s) within the areas targeted  by this assay, and inadequate number of viral copies (<250 copies / mL). A negative result must be combined with clinical observations, patient history, and epidemiological information.  Fact Sheet for Patients:   https://www.patel.info/  Fact Sheet for Healthcare Providers: https://hall.com/  This test is not yet approved or  cleared by the [place] FDA and has been authorized for detection and/or diagnosis of SARS-CoV-2 by FDA under an Emergency Use Authorization (EUA).  This EUA will remain in effect (meaning this test can be used) for the duration of the COVID-19 declaration under Section 564(b)(1) of the Act, 21 U.S.C. section 360bbb-3(b)(1), unless the authorization is terminated or revoked sooner.  Performed at [facility], [place] [address], [place], [place] [address]   Blood Culture (routine x 2)     Status: None (Preliminary result)   Collection Time: 07/01/21 11:17 AM   Specimen: BLOOD LEFT HAND  Result Value Ref Range Status   Specimen Description BLOOD LEFT HAND  Final   Special Requests   Final    BOTTLES DRAWN AEROBIC AND ANAEROBIC Blood Culture results may not be optimal due to an inadequate volume of blood received in culture bottles   Culture   Final    NO GROWTH 2 DAYS Performed at [facility], [place] [address] [place], [place] [address]    Report Status PENDING  Incomplete  Urine Culture     Status: None  (Preliminary result)   Collection Time: 07/01/21 11:21 AM   Specimen: In/Out Cath Urine  Result Value Ref Range Status   Specimen Description IN/OUT CATH URINE  Final   Special Requests NONE  Final   Culture   Final    CULTURE REINCUBATED FOR BETTER GROWTH Performed at [facility], [place] [address] [place], [place] [address]    Report Status PENDING  Incomplete    Labs: CBC: Recent Labs  Lab 07/01/21 0925  WBC 5.6  NEUTROABS 4.2  HGB 14.1  HCT 43.1  MCV 84.5  PLT 657   Basic Metabolic Panel: Recent Labs  Lab 07/01/21 0925  NA 136  K 3.8  CL 105  CO2 22  GLUCOSE 114*  BUN 9  CREATININE 0.41*  CALCIUM 9.9  MG 1.8   Liver Function Tests: Recent Labs  Lab 07/01/21 0925  AST 33  ALT 11  ALKPHOS 241*  BILITOT 0.8  PROT 8.2*  ALBUMIN 3.3*   CBG: No results for input(s): GLUCAP in the last 168 hours.  Discharge time spent: greater than 30 minutes.  Signed: Patrecia Pour, MD [facility] 07/03/2021

## 2021-07-03 NOTE — Plan of Care (Signed)
  Problem: Education: Goal: Knowledge of General Education information will improve Description: Including pain rating scale, medication(s)/side effects and non-pharmacologic comfort measures Outcome: Adequate for Discharge   Problem: Health Behavior/Discharge Planning: Goal: Ability to manage health-related needs will improve 07/03/2021 1419 by Asencion Partridge, RN Outcome: Adequate for Discharge 07/03/2021 0934 by Asencion Partridge, RN Outcome: Progressing   Problem: Clinical Measurements: Goal: Ability to maintain clinical measurements within normal limits will improve 07/03/2021 1419 by Asencion Partridge, RN Outcome: Adequate for Discharge 07/03/2021 0934 by Asencion Partridge, RN Outcome: Progressing Goal: Will remain free from infection 07/03/2021 1419 by Asencion Partridge, RN Outcome: Adequate for Discharge 07/03/2021 0934 by Asencion Partridge, RN Outcome: Progressing Goal: Diagnostic test results will improve Outcome: Adequate for Discharge Goal: Respiratory complications will improve Outcome: Adequate for Discharge Goal: Cardiovascular complication will be avoided Outcome: Adequate for Discharge   Problem: Activity: Goal: Risk for activity intolerance will decrease 07/03/2021 1419 by Asencion Partridge, RN Outcome: Adequate for Discharge 07/03/2021 0934 by Asencion Partridge, RN Outcome: Progressing   Problem: Nutrition: Goal: Adequate nutrition will be maintained Outcome: Adequate for Discharge   Problem: Coping: Goal: Level of anxiety will decrease Outcome: Adequate for Discharge   Problem: Elimination: Goal: Will not experience complications related to bowel motility Outcome: Adequate for Discharge Goal: Will not experience complications related to urinary retention Outcome: Adequate for Discharge   Problem: Pain Managment: Goal: General experience of comfort will improve Outcome: Adequate for Discharge   Problem: Safety: Goal: Ability to remain free from injury will  improve Outcome: Adequate for Discharge   Problem: Skin Integrity: Goal: Risk for impaired skin integrity will decrease Outcome: Adequate for Discharge

## 2021-07-03 NOTE — Progress Notes (Signed)
Discharged to home after IV access removed and discharge instructions reviewed with pt and daughter.  Transported by ambulance accompanied by 2 transporters.

## 2021-07-04 ENCOUNTER — Other Ambulatory Visit: Payer: Self-pay | Admitting: *Deleted

## 2021-07-04 ENCOUNTER — Other Ambulatory Visit: Payer: Self-pay | Admitting: Oncology

## 2021-07-04 DIAGNOSIS — N6320 Unspecified lump in the left breast, unspecified quadrant: Secondary | ICD-10-CM

## 2021-07-04 LAB — URINE CULTURE

## 2021-07-05 ENCOUNTER — Other Ambulatory Visit: Payer: Self-pay | Admitting: *Deleted

## 2021-07-05 ENCOUNTER — Other Ambulatory Visit: Payer: Self-pay | Admitting: Hematology and Oncology

## 2021-07-05 DIAGNOSIS — N6320 Unspecified lump in the left breast, unspecified quadrant: Secondary | ICD-10-CM

## 2021-07-06 LAB — CULTURE, BLOOD (ROUTINE X 2)
Culture: NO GROWTH
Culture: NO GROWTH
Special Requests: ADEQUATE

## 2021-07-07 LAB — VITAMIN B1: Vitamin B1 (Thiamine): 61 nmol/L — ABNORMAL LOW (ref 66.5–200.0)

## 2021-07-11 ENCOUNTER — Other Ambulatory Visit: Payer: Medicare Other

## 2021-07-13 ENCOUNTER — Telehealth: Payer: Self-pay | Admitting: *Deleted

## 2021-07-13 NOTE — Telephone Encounter (Signed)
Noted pt cx MM/US. Request sent for [facility] scheduling to r/s her MM/US  Dr. Chryl Heck notified

## 2021-07-14 ENCOUNTER — Telehealth: Payer: Self-pay | Admitting: Neurology

## 2021-07-14 NOTE — Telephone Encounter (Signed)
Since discharge, the patient's thiamine level has resulted borderline low at 61 (normal limits 66-200).   I attempted to call the patient's cell phone but there was no answer. No PCP on file to forward the result to.   Administrator Ms. Lala Lund will assist with contacting the family on Monday to convey the following  1) if patient's symptoms (hallucinations and confusion) have been stable or improving, she should start taking a thiamine supplement 100 mg daily 2) if patient has had worsening symptoms, she should come to the hospital for IV thiamine repletion, 500 mg every 8 hours for 3 days  [place] [phone]

## 2021-09-15 ENCOUNTER — Other Ambulatory Visit: Payer: Self-pay | Admitting: *Deleted

## 2021-09-15 NOTE — Patient Outreach (Signed)
  Care Coordination   09/15/2021 Name: KEILANY BURNETTE MRN: [id number] DOB: 1955-03-05   Care Coordination Outreach Attempts:  An unsuccessful telephone outreach was attempted today to offer the patient information about available care coordination services as a benefit of their health plan.   Follow Up Plan:  Additional outreach attempts will be made to offer the patient care coordination information and services.   Encounter Outcome:  No Answer  Care Coordination Interventions Activated:  No   Care Coordination Interventions:  No, not indicated    Raina Mina, RN Care Management Coordinator [place] Office [phone]

## 2021-09-27 ENCOUNTER — Other Ambulatory Visit: Payer: Self-pay | Admitting: *Deleted

## 2021-09-27 NOTE — Patient Outreach (Signed)
  Care Coordination   09/27/2021 Name: April Graves MRN: [id number] DOB: [birth year]   Care Coordination Outreach Attempts:  A second unsuccessful outreach was attempted today to offer the patient with information about available care coordination services as a benefit of their health plan.     Follow Up Plan:  Additional outreach attempts will be made to offer the patient care coordination information and services.   Encounter Outcome:  No Answer  Care Coordination Interventions Activated:  No   Care Coordination Interventions:  No, not indicated   Raina Mina, RN Care Management Coordinator [place] Office [phone]

## 2021-10-02 ENCOUNTER — Other Ambulatory Visit: Payer: Self-pay | Admitting: *Deleted

## 2021-10-02 NOTE — Patient Outreach (Signed)
  Care Coordination   10/02/2021 Name: April Graves MRN: [id number] DOB: 08/23/1955   Care Coordination Outreach Attempts:  A third unsuccessful outreach was attempted today to offer the patient with information about available care coordination services as a benefit of their health plan.   Follow Up Plan:  No further outreach attempts will be made at this time. We have been unable to contact the patient to offer or enroll patient in care coordination services  Encounter Outcome:  No Answer  Care Coordination Interventions Activated:  No   Care Coordination Interventions:  No, not indicated    Raina Mina, RN Care Management Coordinator [place] Office [phone]

## 2021-11-07 ENCOUNTER — Ambulatory Visit
Admission: RE | Admit: 2021-11-07 | Discharge: 2021-11-07 | Disposition: A | Discharge status: Home / Self Care | Payer: Medicare Other | Source: Ambulatory Visit | Attending: Hematology and Oncology | Admitting: Hematology and Oncology

## 2021-11-07 ENCOUNTER — Other Ambulatory Visit: Payer: Self-pay | Admitting: Hematology and Oncology

## 2021-11-07 DIAGNOSIS — N6320 Unspecified lump in the left breast, unspecified quadrant: Secondary | ICD-10-CM

## 2021-11-07 DIAGNOSIS — N6489 Other specified disorders of breast: Secondary | ICD-10-CM | POA: Diagnosis not present

## 2021-11-17 ENCOUNTER — Ambulatory Visit
Admission: RE | Admit: 2021-11-17 | Discharge: 2021-11-17 | Disposition: A | Discharge status: Home / Self Care | Payer: Medicare Other | Source: Ambulatory Visit | Attending: Hematology and Oncology | Admitting: Hematology and Oncology

## 2021-11-17 ENCOUNTER — Other Ambulatory Visit: Payer: Self-pay | Admitting: Orthopaedic Surgery

## 2021-11-17 DIAGNOSIS — N6322 Unspecified lump in the left breast, upper inner quadrant: Secondary | ICD-10-CM | POA: Diagnosis not present

## 2021-11-17 DIAGNOSIS — C50212 Malignant neoplasm of upper-inner quadrant of left female breast: Secondary | ICD-10-CM | POA: Diagnosis not present

## 2021-11-17 DIAGNOSIS — N6320 Unspecified lump in the left breast, unspecified quadrant: Secondary | ICD-10-CM

## 2021-11-24 ENCOUNTER — Other Ambulatory Visit: Payer: Self-pay

## 2021-11-24 ENCOUNTER — Encounter: Payer: Self-pay | Admitting: Hematology and Oncology

## 2021-11-24 ENCOUNTER — Inpatient Hospital Stay: Payer: Medicare Other | Attending: Hematology and Oncology | Admitting: Hematology and Oncology

## 2021-11-24 DIAGNOSIS — C50919 Malignant neoplasm of unspecified site of unspecified female breast: Secondary | ICD-10-CM | POA: Insufficient documentation

## 2021-11-24 DIAGNOSIS — C50912 Malignant neoplasm of unspecified site of left female breast: Secondary | ICD-10-CM | POA: Insufficient documentation

## 2021-11-24 DIAGNOSIS — R532 Functional quadriplegia: Secondary | ICD-10-CM | POA: Diagnosis not present

## 2021-11-24 DIAGNOSIS — I10 Essential (primary) hypertension: Secondary | ICD-10-CM | POA: Diagnosis not present

## 2021-11-24 DIAGNOSIS — Z171 Estrogen receptor negative status [ER-]: Secondary | ICD-10-CM | POA: Insufficient documentation

## 2021-11-24 NOTE — Progress Notes (Signed)
[facility] CONSULT NOTE  Patient Care Team: Pcp, No as PCP - General  CHIEF COMPLAINTS/PURPOSE OF CONSULTATION:  Newly diagnosed breast cancer  HISTORY OF PRESENTING ILLNESS:  April Graves 66 y.o. female is here because of recent diagnosis of left breast cancer.  I reviewed her records extensively and collaborated the history with the patient.  SUMMARY OF ONCOLOGIC HISTORY: Oncology History   No history exists.   This is a 66 year old female patient with medical history significant for functional quadriplegia, immobility since 2013, osteoarthritis, chronic pain syndrome, history of pulmonary embolism with left breast mass discovered back in September 2022 on a CT scan at [facility] she was most recently seen in March for altered mental status and treated for UTI.  She was then again seen in May 2023 due to confusion hallucinations and insomnia worsening over the previous month.  She had a CTA performed for tachycardia which once again showed a left breast mass.  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    We recommended a mammogram and ultrasound and recommended her to be seen in June however all these appointments were canceled by the family and recently she came for an ultrasound of the left breast and axilla which showed a large suspicious left breast mass 1 abnormal left axillary lymph node.  The lymph node was not biopsied at this time.  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 Her son and daughter are with her. About 10 yrs ago, she says she had an accident tripped down flight of stairs and since then became quadriplegic.  She lives with her daughter, can feed herself with a spoon but requires assistance with other activities.   They have equipped the household for handicapped people which makes it easier for her.  Son tells me that the breast cancer has gone tremendously in the last couple months.  They initially were supposed to get a mammogram in June but the cancel appointments because they had other conflicting appointments.  She did try to participate in the conversation from time to time but mostly remains silent.  Her son was the main historian. She tells me she wants to take a pill for the cancer.  Rest of the pertinent 10 point ROS reviewed and negative  MEDICAL HISTORY:  Past Medical History:  Diagnosis Date   Anemia of chronic disease    Chronic pain syndrome    Functional quadriplegia ([place])    noted in d/c summary 03/2015   History of decubitus ulcer    infected, left glut/hip   History of malnutrition    Osteoarthritis of both shoulders    Poor dentition    Secondary osteoarthritis of left ankle    Post-traumatic; at the tibiotalar joint.  + severe disuse osteopenia.    SURGICAL HISTORY: Past Surgical History:  Procedure Laterality Date   KNEE CLOSED REDUCTION  10/16/2011   No fracture.  Procedure: CLOSED MANIPULATION KNEE;  Surgeon: Mauri Pole, MD;  Location: [facility];  Service: Orthopedics;;  Proxmial Fibula   TUBAL LIGATION      SOCIAL HISTORY: Social History   Socioeconomic History   Marital status: Single    Spouse name: Not on file   Number of children: Not on file   Years of education: Not on  file   Highest education level: Not on file  Occupational History   Not on file  Tobacco Use   Smoking status: Never   Smokeless tobacco: Never  Substance and Sexual Activity   Alcohol use: No   Drug use: No   Sexual activity: Never  Other Topics Concern   Not on file  Social History Narrative   Not on file   Social Determinants of Health   Financial Resource Strain: Not on file  Food Insecurity: Not on file  Transportation Needs: Not on file  Physical Activity: Not on file   Stress: Not on file  Social Connections: Not on file  Intimate Partner Violence: Not on file    FAMILY HISTORY: Family History  Problem Relation Age of Onset   Hypertension Other     ALLERGIES:  is allergic to codeine, lisinopril, and pork-derived products.  MEDICATIONS:  Current Outpatient Medications  Medication Sig Dispense Refill   acetaminophen (TYLENOL) 325 MG tablet Take 2 tablets (650 mg total) by mouth every 6 (six) hours as needed for mild pain (or Fever >/= 101). 30 tablet 0   albuterol (PROVENTIL HFA;VENTOLIN HFA) 108 (90 Base) MCG/ACT inhaler Inhale 1-2 puffs into the lungs every 6 (six) hours as needed for wheezing or shortness of breath.     Artificial Saliva (BIOTENE DRY MOUTH) LOZG Use as directed 1 tablet in the mouth or throat daily.     aspirin 81 MG chewable tablet Chew 1 tablet (81 mg total) by mouth daily. 30 tablet 0   atorvastatin (LIPITOR) 40 MG tablet Take 1 tablet (40 mg total) by mouth daily. 30 tablet 0   diclofenac Sodium (VOLTAREN) 1 % GEL 2 g 4 (four) times daily as needed (pain).     erythromycin ophthalmic ointment Place into both eyes 4 (four) times daily. Place a 1/2 inch ribbon of ointment into the lower eyelid. (Patient taking differently: Place 1 application. into both eyes 2 (two) times daily. Place a 1/2 inch ribbon of ointment into the lower eyelid.) 3.5 g 0   LUBRICANT EYE DROPS 0.4-0.3 % SOLN Place 1 drop into both eyes daily as needed (eye lubricant).     Melatonin 5 MG CHEW Chew 10 mg by mouth at bedtime.     Multiple Vitamin (MULTIVITAMIN WITH MINERALS) TABS tablet Take 1 tablet by mouth daily.     oxyCODONE-acetaminophen (PERCOCET/ROXICET) 5-325 MG tablet Take 2 tablets by mouth every 6 (six) hours as needed for moderate pain. 12 tablet 0   QUEtiapine (SEROQUEL) 50 MG tablet Take 1 tablet (50 mg total) by mouth at bedtime. 30 tablet 0   No current facility-administered medications for this visit.    REVIEW OF SYSTEMS:    Constitutional: Denies fevers, chills or abnormal night sweats Eyes: Denies blurriness of vision, double vision or watery eyes Ears, nose, mouth, throat, and face: Denies mucositis or sore throat Respiratory: Denies cough, dyspnea or wheezes Cardiovascular: Denies palpitation, chest discomfort or lower extremity swelling Gastrointestinal:  Denies nausea, heartburn or change in bowel habits Skin: Denies abnormal skin rashes Lymphatics: Denies new lymphadenopathy or easy bruising Neurological:Denies numbness, tingling or new weaknesses Behavioral/Psych: Mood is stable, no new changes  Breast: Denies any palpable lumps or discharge All other systems were reviewed with the patient and are negative.  PHYSICAL EXAMINATION: ECOG PERFORMANCE STATUS: 4 - Bedbound  Vitals:   11/24/21 0920  BP: (!) 134/98  Pulse: (!) 148  Resp: 16  Temp: (!) 97.5 F (36.4 C)  SpO2:  98%   Filed Weights    GENERAL:alert, not very conversant but intermittently participated in the conversation, in the wheelchair with contractures, she has been paraplegic for almost 10 years after bad fall. Breast: Left breast almost nearly replaced by the mass.  Patient tells me that she cannot lift her hands up for left axillary examination. Lower extremities: No lower extremity edema  LABORATORY DATA:  I have reviewed the data as listed Lab Results  Component Value Date   WBC 5.6 07/01/2021   HGB 14.1 07/01/2021   HCT 43.1 07/01/2021   MCV 84.5 07/01/2021   PLT 349 07/01/2021   Lab Results  Component Value Date   NA 136 07/01/2021   K 3.8 07/01/2021   CL 105 07/01/2021   CO2 22 07/01/2021    RADIOGRAPHIC STUDIES: I have personally reviewed the radiological reports and agreed with the findings in the report.  ASSESSMENT AND PLAN:  Malignant neoplasm of female breast [facility]) This is a 66 year old female patient with recent diagnosis of left breast invasive ductal carcinoma, triple negative with a large tumor  entirely replacing the left breast, left axillary lymphadenopathy and questionable calvarial lesions who presented to medical oncology for an initial visit with her son and her daughter.  She is a quadriplegic at baseline after a fall 2010 years ago when she fell down from flights of stairs.  She otherwise has some baseline hypertension.  Mobility is severely limited because of the quadriplegia.  She has some contractures in her limbs.  She lives with her daughter, her house is equipped for handicapped people.  Physical examination today, and very firm indurated mass nearly occupying the entire left breast.  Left axillary exam could not be done, patient tells me that she cannot lift her left axilla. Given her baseline performance status, I do not believe she will be a good candidate for any form of systemic chemotherapy.  Since she is triple negative, we discussed that there is no role for antiestrogen therapy.  She Tells me that she wants to take a pill for the breast cancer.  We have discussed that even if it is a pill it will be a chemotherapy in the form of a pill.  I do not believe she is a candidate for surgery as well.  At this time I have discussed about considering palliative care for goals of care discussion.  After I discussed with her about this, patient became mildly irritable and agitated and wanted to go home.  Tried to explain to the son that without any treatment, life expectancy is in the order of a few months.I will send a referral to palliative care, also will try to present in the breast tumor board for further discussion.    I also worry that she may have metastatic disease given the pace of the growth and since this mass has been likely present for almost a year.  There is some documentation in the last discharge summary that this mass was initially noted in fall 2022.  Time spent: 60 minutes including history, physical exam, review of records, counseling and coordination of care. All  questions were answered. The patient knows to call the clinic with any problems, questions or concerns.    Benay Pike, MD 11/24/21

## 2021-11-24 NOTE — Assessment & Plan Note (Addendum)
This is a 66 year old female patient with recent diagnosis of left breast invasive ductal carcinoma, triple negative with a large tumor entirely replacing the left breast, left axillary lymphadenopathy and questionable calvarial lesions who presented to medical oncology for an initial visit with her son and her daughter.  She is a quadriplegic at baseline after a fall 2010 years ago when she fell down from flights of stairs.  She otherwise has some baseline hypertension.  Mobility is severely limited because of the quadriplegia.  She has some contractures in her limbs.  She lives with her daughter, her house is equipped for handicapped people.  Physical examination today, and very firm indurated mass nearly occupying the entire left breast.  Left axillary exam could not be done, patient tells me that she cannot lift her left axilla. Given her baseline performance status, I do not believe she will be a good candidate for any form of systemic chemotherapy.  Since she is triple negative, we discussed that there is no role for antiestrogen therapy.  She Tells me that she wants to take a pill for the breast cancer.  We have discussed that even if it is a pill it will be a chemotherapy in the form of a pill.  I do not believe she is a candidate for surgery as well.  At this time I have discussed about considering palliative care for goals of care discussion.  After I discussed with her about this, patient became mildly irritable and agitated and wanted to go home.  Tried to explain to the son that without any treatment, life expectancy is in the order of a few months.I will send a referral to palliative care, also will try to present in the breast tumor board for further discussion.    I also worry that she may have metastatic disease given the pace of the growth and since this mass has been likely present for almost a year.  There is some documentation in the last discharge summary that this mass was initially noted  in fall 2022.

## 2021-11-28 ENCOUNTER — Telehealth: Payer: Self-pay | Admitting: *Deleted

## 2021-11-28 NOTE — Telephone Encounter (Signed)
This RN spoke with the patient's son, Charlann Lange, per his VM asking about a follow up call regarding visit on 10/20.  He stated concern for "not even trying a treatment " - patient and family are motivated to try therapy even though the patient has debilitating physical issues"  This RN stated concern was noted for treatments that could cause greater decrease in the patient's quality of life including diarrhea causing skin breakdown, nausea and vomiting.  Charlann Lange states they are willing to manage the above to allow "my mom to have a chance of improvement because what if in two years she is still alive and we didn't do anything when we could ?"  This RN validated  his concerns and care for his mother.  Informed him above would be given to MD for review for further work up and possible therapies of benefit.  This RN asked about probable need for scans with [facility] verbalizing understanding and willingness to assist with care.  This RN reviewed with MD above concerns- with noted plan post visit to present pt in [facility] tomorrow and then call [facility] with possible treatments.  Dr Chryl Heck did state per above to proceed with requesting a PET scan for appropriate staging and treatment options.

## 2021-11-29 ENCOUNTER — Other Ambulatory Visit: Payer: Self-pay | Admitting: Hematology and Oncology

## 2021-11-29 ENCOUNTER — Other Ambulatory Visit: Payer: Self-pay | Admitting: *Deleted

## 2021-11-29 ENCOUNTER — Encounter: Payer: Self-pay | Admitting: *Deleted

## 2021-11-29 DIAGNOSIS — C50912 Malignant neoplasm of unspecified site of left female breast: Secondary | ICD-10-CM

## 2021-11-29 DIAGNOSIS — N6320 Unspecified lump in the left breast, unspecified quadrant: Secondary | ICD-10-CM

## 2021-12-04 ENCOUNTER — Telehealth: Payer: Self-pay | Admitting: *Deleted

## 2021-12-04 NOTE — Telephone Encounter (Signed)
This RN called to [employer] per need for PDL-1 to be done on recent pathology - was directed to contact reading pathologist at [facility].  This RN called to the [facility] department and spoke with Maureen Ralphs took request and will obtain slides from [place] for appropriate testing.

## 2021-12-14 ENCOUNTER — Telehealth: Payer: Self-pay | Admitting: Nurse Practitioner

## 2021-12-14 NOTE — Telephone Encounter (Signed)
Scheduled per 11/9 in basket, message has been left

## 2021-12-21 ENCOUNTER — Inpatient Hospital Stay: Payer: Medicare Other

## 2021-12-22 ENCOUNTER — Ambulatory Visit (HOSPITAL_COMMUNITY): Admission: RE | Admit: 2021-12-22 | Payer: Medicare Other | Source: Ambulatory Visit

## 2021-12-25 ENCOUNTER — Inpatient Hospital Stay: Payer: Medicare Other

## 2022-01-08 ENCOUNTER — Telehealth: Payer: Self-pay

## 2022-01-08 NOTE — Telephone Encounter (Signed)
Attempted to call pt to r/s missed palliative appt, no answer unable to LVM

## 2022-05-07 ENCOUNTER — Inpatient Hospital Stay (HOSPITAL_COMMUNITY)
Admission: EM | Admit: 2022-05-07 | Discharge: 2022-06-06 | DRG: 871 | Disposition: E | Payer: 59 | Attending: Internal Medicine | Admitting: Internal Medicine

## 2022-05-07 ENCOUNTER — Emergency Department (HOSPITAL_COMMUNITY): Payer: 59

## 2022-05-07 ENCOUNTER — Encounter (HOSPITAL_COMMUNITY): Payer: Self-pay

## 2022-05-07 DIAGNOSIS — L89100 Pressure ulcer of unspecified part of back, unstageable: Secondary | ICD-10-CM | POA: Diagnosis present

## 2022-05-07 DIAGNOSIS — A419 Sepsis, unspecified organism: Secondary | ICD-10-CM | POA: Diagnosis not present

## 2022-05-07 DIAGNOSIS — E875 Hyperkalemia: Secondary | ICD-10-CM | POA: Diagnosis present

## 2022-05-07 DIAGNOSIS — R4182 Altered mental status, unspecified: Secondary | ICD-10-CM | POA: Diagnosis not present

## 2022-05-07 DIAGNOSIS — G894 Chronic pain syndrome: Secondary | ICD-10-CM | POA: Diagnosis present

## 2022-05-07 DIAGNOSIS — R6520 Severe sepsis without septic shock: Secondary | ICD-10-CM | POA: Diagnosis not present

## 2022-05-07 DIAGNOSIS — D735 Infarction of spleen: Secondary | ICD-10-CM | POA: Diagnosis present

## 2022-05-07 DIAGNOSIS — R400 Somnolence: Secondary | ICD-10-CM | POA: Diagnosis present

## 2022-05-07 DIAGNOSIS — E872 Acidosis, unspecified: Secondary | ICD-10-CM | POA: Diagnosis present

## 2022-05-07 DIAGNOSIS — Z7189 Other specified counseling: Secondary | ICD-10-CM

## 2022-05-07 DIAGNOSIS — R6521 Severe sepsis with septic shock: Secondary | ICD-10-CM | POA: Diagnosis not present

## 2022-05-07 DIAGNOSIS — R54 Age-related physical debility: Secondary | ICD-10-CM | POA: Diagnosis present

## 2022-05-07 DIAGNOSIS — L89154 Pressure ulcer of sacral region, stage 4: Secondary | ICD-10-CM | POA: Diagnosis not present

## 2022-05-07 DIAGNOSIS — N280 Ischemia and infarction of kidney: Secondary | ICD-10-CM | POA: Diagnosis present

## 2022-05-07 DIAGNOSIS — Z1152 Encounter for screening for COVID-19: Secondary | ICD-10-CM | POA: Diagnosis not present

## 2022-05-07 DIAGNOSIS — L89112 Pressure ulcer of right upper back, stage 2: Secondary | ICD-10-CM | POA: Diagnosis present

## 2022-05-07 DIAGNOSIS — Z515 Encounter for palliative care: Secondary | ICD-10-CM | POA: Diagnosis not present

## 2022-05-07 DIAGNOSIS — D72829 Elevated white blood cell count, unspecified: Secondary | ICD-10-CM | POA: Diagnosis present

## 2022-05-07 DIAGNOSIS — B964 Proteus (mirabilis) (morganii) as the cause of diseases classified elsewhere: Secondary | ICD-10-CM | POA: Diagnosis present

## 2022-05-07 DIAGNOSIS — R64 Cachexia: Secondary | ICD-10-CM | POA: Diagnosis not present

## 2022-05-07 DIAGNOSIS — G936 Cerebral edema: Secondary | ICD-10-CM | POA: Diagnosis present

## 2022-05-07 DIAGNOSIS — Z66 Do not resuscitate: Secondary | ICD-10-CM | POA: Diagnosis not present

## 2022-05-07 DIAGNOSIS — R1310 Dysphagia, unspecified: Secondary | ICD-10-CM | POA: Diagnosis present

## 2022-05-07 DIAGNOSIS — L89122 Pressure ulcer of left upper back, stage 2: Secondary | ICD-10-CM | POA: Diagnosis present

## 2022-05-07 DIAGNOSIS — N6320 Unspecified lump in the left breast, unspecified quadrant: Secondary | ICD-10-CM | POA: Diagnosis present

## 2022-05-07 DIAGNOSIS — D696 Thrombocytopenia, unspecified: Secondary | ICD-10-CM | POA: Diagnosis present

## 2022-05-07 DIAGNOSIS — E871 Hypo-osmolality and hyponatremia: Secondary | ICD-10-CM | POA: Diagnosis present

## 2022-05-07 DIAGNOSIS — L89136 Pressure-induced deep tissue damage of right lower back: Secondary | ICD-10-CM | POA: Diagnosis present

## 2022-05-07 DIAGNOSIS — D630 Anemia in neoplastic disease: Secondary | ICD-10-CM | POA: Diagnosis present

## 2022-05-07 DIAGNOSIS — L89222 Pressure ulcer of left hip, stage 2: Secondary | ICD-10-CM | POA: Diagnosis present

## 2022-05-07 DIAGNOSIS — N281 Cyst of kidney, acquired: Secondary | ICD-10-CM | POA: Diagnosis not present

## 2022-05-07 DIAGNOSIS — L89213 Pressure ulcer of right hip, stage 3: Secondary | ICD-10-CM | POA: Diagnosis present

## 2022-05-07 DIAGNOSIS — L89894 Pressure ulcer of other site, stage 4: Secondary | ICD-10-CM | POA: Diagnosis not present

## 2022-05-07 DIAGNOSIS — L89892 Pressure ulcer of other site, stage 2: Secondary | ICD-10-CM | POA: Diagnosis present

## 2022-05-07 DIAGNOSIS — I96 Gangrene, not elsewhere classified: Secondary | ICD-10-CM | POA: Diagnosis not present

## 2022-05-07 DIAGNOSIS — R000 Tachycardia, unspecified: Secondary | ICD-10-CM | POA: Diagnosis not present

## 2022-05-07 DIAGNOSIS — R532 Functional quadriplegia: Secondary | ICD-10-CM | POA: Diagnosis not present

## 2022-05-07 DIAGNOSIS — E43 Unspecified severe protein-calorie malnutrition: Secondary | ICD-10-CM | POA: Diagnosis present

## 2022-05-07 DIAGNOSIS — R918 Other nonspecific abnormal finding of lung field: Secondary | ICD-10-CM | POA: Diagnosis not present

## 2022-05-07 DIAGNOSIS — C50912 Malignant neoplasm of unspecified site of left female breast: Secondary | ICD-10-CM | POA: Diagnosis not present

## 2022-05-07 DIAGNOSIS — L89896 Pressure-induced deep tissue damage of other site: Secondary | ICD-10-CM | POA: Diagnosis not present

## 2022-05-07 DIAGNOSIS — D638 Anemia in other chronic diseases classified elsewhere: Secondary | ICD-10-CM | POA: Diagnosis present

## 2022-05-07 DIAGNOSIS — A4159 Other Gram-negative sepsis: Secondary | ICD-10-CM | POA: Diagnosis not present

## 2022-05-07 DIAGNOSIS — K3189 Other diseases of stomach and duodenum: Secondary | ICD-10-CM | POA: Diagnosis not present

## 2022-05-07 DIAGNOSIS — L89102 Pressure ulcer of unspecified part of back, stage 2: Secondary | ICD-10-CM | POA: Diagnosis present

## 2022-05-07 DIAGNOSIS — L89204 Pressure ulcer of unspecified hip, stage 4: Secondary | ICD-10-CM | POA: Diagnosis present

## 2022-05-07 DIAGNOSIS — C7931 Secondary malignant neoplasm of brain: Secondary | ICD-10-CM | POA: Diagnosis present

## 2022-05-07 DIAGNOSIS — Z9181 History of falling: Secondary | ICD-10-CM

## 2022-05-07 DIAGNOSIS — Z8249 Family history of ischemic heart disease and other diseases of the circulatory system: Secondary | ICD-10-CM

## 2022-05-07 DIAGNOSIS — Z681 Body mass index (BMI) 19 or less, adult: Secondary | ICD-10-CM | POA: Diagnosis not present

## 2022-05-07 DIAGNOSIS — Z86711 Personal history of pulmonary embolism: Secondary | ICD-10-CM

## 2022-05-07 DIAGNOSIS — Z885 Allergy status to narcotic agent status: Secondary | ICD-10-CM

## 2022-05-07 DIAGNOSIS — S21002A Unspecified open wound of left breast, initial encounter: Secondary | ICD-10-CM | POA: Diagnosis present

## 2022-05-07 DIAGNOSIS — Z7982 Long term (current) use of aspirin: Secondary | ICD-10-CM

## 2022-05-07 DIAGNOSIS — Z888 Allergy status to other drugs, medicaments and biological substances status: Secondary | ICD-10-CM

## 2022-05-07 DIAGNOSIS — H5710 Ocular pain, unspecified eye: Secondary | ICD-10-CM | POA: Diagnosis not present

## 2022-05-07 DIAGNOSIS — L089 Local infection of the skin and subcutaneous tissue, unspecified: Secondary | ICD-10-CM | POA: Diagnosis present

## 2022-05-07 DIAGNOSIS — Z79899 Other long term (current) drug therapy: Secondary | ICD-10-CM

## 2022-05-07 DIAGNOSIS — G9340 Encephalopathy, unspecified: Secondary | ICD-10-CM | POA: Diagnosis not present

## 2022-05-07 DIAGNOSIS — W19XXXS Unspecified fall, sequela: Secondary | ICD-10-CM | POA: Diagnosis present

## 2022-05-07 LAB — CBC WITH DIFFERENTIAL/PLATELET
Abs Immature Granulocytes: 1.11 10*3/uL — ABNORMAL HIGH (ref 0.00–0.07)
Basophils Absolute: 0.2 10*3/uL — ABNORMAL HIGH (ref 0.0–0.1)
Basophils Relative: 1 %
Eosinophils Absolute: 0 10*3/uL (ref 0.0–0.5)
Eosinophils Relative: 0 %
HCT: 39.3 % (ref 36.0–46.0)
Hemoglobin: 12.1 g/dL (ref 12.0–15.0)
Immature Granulocytes: 4 %
Lymphocytes Relative: 6 %
Lymphs Abs: 1.8 10*3/uL (ref 0.7–4.0)
MCH: 24.8 pg — ABNORMAL LOW (ref 26.0–34.0)
MCHC: 30.8 g/dL (ref 30.0–36.0)
MCV: 80.5 fL (ref 80.0–100.0)
Monocytes Absolute: 0.6 10*3/uL (ref 0.1–1.0)
Monocytes Relative: 2 %
Neutro Abs: 28.4 10*3/uL — ABNORMAL HIGH (ref 1.7–7.7)
Neutrophils Relative %: 87 %
Platelets: 99 10*3/uL — ABNORMAL LOW (ref 150–400)
RBC: 4.88 MIL/uL (ref 3.87–5.11)
RDW: 18.5 % — ABNORMAL HIGH (ref 11.5–15.5)
WBC: 32.1 10*3/uL — ABNORMAL HIGH (ref 4.0–10.5)
nRBC: 0.1 % (ref 0.0–0.2)

## 2022-05-07 LAB — APTT: aPTT: 27 seconds (ref 24–36)

## 2022-05-07 LAB — COMPREHENSIVE METABOLIC PANEL
ALT: 13 U/L (ref 0–44)
AST: 78 U/L — ABNORMAL HIGH (ref 15–41)
Albumin: 2.4 g/dL — ABNORMAL LOW (ref 3.5–5.0)
Alkaline Phosphatase: 216 U/L — ABNORMAL HIGH (ref 38–126)
Anion gap: 14 (ref 5–15)
BUN: 73 mg/dL — ABNORMAL HIGH (ref 8–23)
CO2: 17 mmol/L — ABNORMAL LOW (ref 22–32)
Calcium: 9.4 mg/dL (ref 8.9–10.3)
Chloride: 97 mmol/L — ABNORMAL LOW (ref 98–111)
Creatinine, Ser: 1.08 mg/dL — ABNORMAL HIGH (ref 0.44–1.00)
GFR, Estimated: 57 mL/min — ABNORMAL LOW (ref >60)
Glucose, Bld: 135 mg/dL — ABNORMAL HIGH (ref 70–99)
Potassium: 5.5 mmol/L — ABNORMAL HIGH (ref 3.5–5.1)
Sodium: 128 mmol/L — ABNORMAL LOW (ref 135–145)
Total Bilirubin: 1.3 mg/dL — ABNORMAL HIGH (ref 0.3–1.2)
Total Protein: 7.2 g/dL (ref 6.5–8.1)

## 2022-05-07 LAB — PROTIME-INR
INR: 1.4 — ABNORMAL HIGH (ref 0.8–1.2)
Prothrombin Time: 17.3 seconds — ABNORMAL HIGH (ref 11.4–15.2)

## 2022-05-07 LAB — LACTIC ACID, PLASMA: Lactic Acid, Venous: 3.2 mmol/L (ref 0.5–1.9)

## 2022-05-07 MED ORDER — LACTATED RINGERS IV BOLUS (SEPSIS)
250.0000 mL | Freq: Once | INTRAVENOUS | Status: AC
  Administered 2022-05-08: 250 mL via INTRAVENOUS

## 2022-05-07 MED ORDER — LACTATED RINGERS IV BOLUS (SEPSIS)
500.0000 mL | Freq: Once | INTRAVENOUS | Status: AC
  Administered 2022-05-08: 500 mL via INTRAVENOUS

## 2022-05-07 MED ORDER — LACTATED RINGERS IV SOLN: INTRAVENOUS | Status: AC

## 2022-05-07 MED ORDER — SODIUM CHLORIDE (PF) 0.9 % IJ SOLN
INTRAMUSCULAR | Status: AC
  Filled 2022-05-07: qty 50

## 2022-05-07 MED ORDER — LACTATED RINGERS IV BOLUS (SEPSIS)
1000.0000 mL | Freq: Once | INTRAVENOUS | Status: AC
  Administered 2022-05-07: 1000 mL via INTRAVENOUS

## 2022-05-07 MED ORDER — VANCOMYCIN HCL IN DEXTROSE 1-5 GM/200ML-% IV SOLN
1000.0000 mg | Freq: Once | INTRAVENOUS | Status: AC
  Administered 2022-05-07: 1000 mg via INTRAVENOUS
  Filled 2022-05-07: qty 200

## 2022-05-07 MED ORDER — SODIUM CHLORIDE 0.9 % IV SOLN
2.0000 g | Freq: Once | INTRAVENOUS | Status: AC
  Administered 2022-05-07: 2 g via INTRAVENOUS
  Filled 2022-05-07: qty 12.5

## 2022-05-07 NOTE — ED Provider Notes (Incomplete)
[place] EMERGENCY DEPARTMENT AT [facility] Provider Note   CSN: [id number] Arrival date & time: 05/07/22  2143     History {Add pertinent medical, surgical, social history, OB history to HPI:1} Chief Complaint  Patient presents with  . Altered Mental Status    April Graves is a 67 y.o. female.   Altered Mental Status      Home Medications Prior to Admission medications   Medication Sig Start Date End Date Taking? Authorizing Provider  acetaminophen (TYLENOL) 325 MG tablet Take 2 tablets (650 mg total) by mouth every 6 (six) hours as needed for mild pain (or Fever >/= 101). 03/13/15   Robbie Lis, MD  albuterol (PROVENTIL HFA;VENTOLIN HFA) 108 (90 Base) MCG/ACT inhaler Inhale 1-2 puffs into the lungs every 6 (six) hours as needed for wheezing or shortness of breath.    [provider]  Artificial Saliva (BIOTENE DRY MOUTH) LOZG Use as directed 1 tablet in the mouth or throat daily.    [provider]  aspirin 81 MG chewable tablet Chew 1 tablet (81 mg total) by mouth daily. 07/03/21   Patrecia Pour, MD  atorvastatin (LIPITOR) 40 MG tablet Take 1 tablet (40 mg total) by mouth daily. 07/03/21   Patrecia Pour, MD  diclofenac Sodium (VOLTAREN) 1 % GEL 2 g 4 (four) times daily as needed (pain). 07/13/19   [provider]  erythromycin ophthalmic ointment Place into both eyes 4 (four) times daily. Place a 1/2 inch ribbon of ointment into the lower eyelid. Patient taking differently: Place 1 application. into both eyes 2 (two) times daily. Place a 1/2 inch ribbon of ointment into the lower eyelid. 05/02/21   Truddie Hidden, MD  LUBRICANT EYE DROPS 0.4-0.3 % SOLN Place 1 drop into both eyes daily as needed (eye lubricant). 02/21/17   [provider]  Melatonin 5 MG CHEW Chew 10 mg by mouth at bedtime.    [provider]  Multiple Vitamin (MULTIVITAMIN WITH MINERALS) TABS tablet Take 1 tablet by mouth daily.    [provider]  oxyCODONE-acetaminophen (PERCOCET/ROXICET) 5-325 MG tablet Take 2 tablets by mouth every 6 (six) hours as needed for moderate pain. 03/03/17   Doreatha Lew, MD  QUEtiapine (SEROQUEL) 50 MG tablet Take 1 tablet (50 mg total) by mouth at bedtime. 07/03/21   Patrecia Pour, MD      Allergies    Codeine, Lisinopril, and Pork-derived products    Review of Systems   Review of Systems  Physical Exam Updated Vital Signs BP 93/72   Pulse (!) 117   Temp (!) 96.8 F (36 C) (Axillary)   Resp (!) 25   Ht 5\' 6"  (1.676 m)   SpO2 96%   BMI 22.60 kg/m  Physical Exam  ED Results / Procedures / Treatments   Labs (all labs ordered are listed, but only abnormal results are displayed) Labs Reviewed  LACTIC ACID, PLASMA - Abnormal; Notable for the following components:      Result Value   Lactic Acid, Venous 3.2 (*)    All other components within normal limits  COMPREHENSIVE METABOLIC PANEL - Abnormal; Notable for the following components:   Sodium 128 (*)    Potassium 5.5 (*)    Chloride 97 (*)    CO2 17 (*)    Glucose, Bld 135 (*)    BUN 73 (*)    Creatinine, Ser 1.08 (*)    Albumin 2.4 (*)  AST 78 (*)    Alkaline Phosphatase 216 (*)    Total Bilirubin 1.3 (*)    GFR, Estimated 57 (*)    All other components within normal limits  PROTIME-INR - Abnormal; Notable for the following components:   Prothrombin Time 17.3 (*)    INR 1.4 (*)    All other components within normal limits  RESP PANEL BY RT-PCR (RSV, FLU A&B, COVID)  RVPGX2  CULTURE, BLOOD (ROUTINE X 2)  CULTURE, BLOOD (ROUTINE X 2)  APTT  LACTIC ACID, PLASMA  CBC WITH DIFFERENTIAL/PLATELET  BLOOD GAS, VENOUS  URINALYSIS, W/ REFLEX TO CULTURE (INFECTION SUSPECTED)  I-STAT CHEM 8, ED    EKG None  Radiology No results found.  Procedures Procedures  {Document cardiac monitor, telemetry assessment procedure when appropriate:1}  Medications Ordered in ED Medications  lactated ringers infusion  (has no administration in time range)  lactated ringers bolus 1,000 mL (1,000 mLs Intravenous New Bag/Given 05/07/22 2320)    And  lactated ringers bolus 500 mL (has no administration in time range)    And  lactated ringers bolus 250 mL (has no administration in time range)  vancomycin (VANCOCIN) IVPB 1000 mg/200 mL premix (1,000 mg Intravenous New Bag/Given 05/07/22 2319)  ceFEPIme (MAXIPIME) 2 g in sodium chloride 0.9 % 100 mL IVPB (2 g Intravenous New Bag/Given 05/07/22 2258)    ED Course/ Medical Decision Making/ A&P   {   Click here for ABCD2, HEART and other calculatorsREFRESH Note before signing :1}                          Medical Decision Making Amount and/or Complexity of Data Reviewed Labs: ordered. Radiology: ordered. ECG/medicine tests: ordered.  Risk Prescription drug management.   ***  {Document critical care time when appropriate:1} {Document review of labs and clinical decision tools ie heart score, Chads2Vasc2 etc:1}  {Document your independent review of radiology images, and any outside records:1} {Document your discussion with family members, caretakers, and with consultants:1} {Document social determinants of health affecting pt's care:1} {Document your decision making why or why not admission, treatments were needed:1} Final Clinical Impression(s) / ED Diagnoses Final diagnoses:  None    Rx / DC Orders ED Discharge Orders     None

## 2022-05-07 NOTE — ED Provider Notes (Signed)
[place] EMERGENCY DEPARTMENT AT [facility] Provider Note   CSN: [id number] Arrival date & time: 05/07/22  2143     History  Chief Complaint  Patient presents with   Altered Mental Status    April Graves is a 67 y.o. female.  Patient is a 67 year old female with a past medical history of breast cancer not on chemotherapy presenting to the emergency department from home with altered mental status.  Per EMS, they were initially called out for difficulty and pain with swallowing however on their arrival, the patient was altered and unable to provide any additional history.  The family reported that the patient was moving around and talking with them yesterday.  EMS noted a foul-smelling necrotic wound to her left breast with maggots as well as sacral decubitus wound.  Daughter at bedside reports that she helps her mom with dressing changes and states that she has not noticed significant change in the appearance of her wound.  She states that she has been eating and drinking the last couple of days and was complaining of throat pain and occasionally sounding like she is choking when she is drinking.  Her daughter called her son on the phone and they state that she would like to be full code and are okay with getting pressors if needed.  The history is provided by the EMS personnel and a relative. The history is limited by the condition of the patient.  Altered Mental Status      Home Medications Prior to Admission medications   Medication Sig Start Date End Date Taking? Authorizing Provider  acetaminophen (TYLENOL) 325 MG tablet Take 2 tablets (650 mg total) by mouth every 6 (six) hours as needed for mild pain (or Fever >/= 101). 03/13/15   Robbie Lis, MD  albuterol (PROVENTIL HFA;VENTOLIN HFA) 108 (90 Base) MCG/ACT inhaler Inhale 1-2 puffs into the lungs every 6 (six) hours as needed for wheezing or shortness of breath.    [provider]  Artificial Saliva  (BIOTENE DRY MOUTH) LOZG Use as directed 1 tablet in the mouth or throat daily.    [provider]  aspirin 81 MG chewable tablet Chew 1 tablet (81 mg total) by mouth daily. 07/03/21   Patrecia Pour, MD  atorvastatin (LIPITOR) 40 MG tablet Take 1 tablet (40 mg total) by mouth daily. 07/03/21   Patrecia Pour, MD  diclofenac Sodium (VOLTAREN) 1 % GEL 2 g 4 (four) times daily as needed (pain). 07/13/19   [provider]  erythromycin ophthalmic ointment Place into both eyes 4 (four) times daily. Place a 1/2 inch ribbon of ointment into the lower eyelid. Patient taking differently: Place 1 application. into both eyes 2 (two) times daily. Place a 1/2 inch ribbon of ointment into the lower eyelid. 05/02/21   Truddie Hidden, MD  LUBRICANT EYE DROPS 0.4-0.3 % SOLN Place 1 drop into both eyes daily as needed (eye lubricant). 02/21/17   [provider]  Melatonin 5 MG CHEW Chew 10 mg by mouth at bedtime.    [provider]  Multiple Vitamin (MULTIVITAMIN WITH MINERALS) TABS tablet Take 1 tablet by mouth daily.    [provider]  oxyCODONE-acetaminophen (PERCOCET/ROXICET) 5-325 MG tablet Take 2 tablets by mouth every 6 (six) hours as needed for moderate pain. 03/03/17   Doreatha Lew, MD  QUEtiapine (SEROQUEL) 50 MG tablet Take 1 tablet (50 mg total) by mouth at bedtime. 07/03/21   Patrecia Pour,  MD      Allergies    Codeine, Lisinopril, and Pork-derived products    Review of Systems   Review of Systems  Physical Exam Updated Vital Signs BP 90/73   Pulse (!) 120   Temp 98.1 F (36.7 C) (Oral)   Resp (!) 22   Ht 5\' 6"  (1.676 m)   SpO2 96%   BMI 22.60 kg/m  Physical Exam Vitals and nursing note reviewed.  Constitutional:      Appearance: She is ill-appearing.     Comments: Eyes closed,  intermittently opens spontaneously, not answering questions Frail, cachectic appearing  HENT:     Head: Normocephalic and atraumatic.     Nose: Nose normal.      Mouth/Throat:     Mouth: Mucous membranes are dry.     Pharynx: Oropharynx is clear.  Eyes:     Extraocular Movements: Extraocular movements intact.     Conjunctiva/sclera: Conjunctivae normal.     Pupils: Pupils are equal, round, and reactive to light.  Cardiovascular:     Rate and Rhythm: Normal rate and regular rhythm.     Pulses: Normal pulses.     Heart sounds: Normal heart sounds.  Pulmonary:     Effort: Pulmonary effort is normal.     Breath sounds: Normal breath sounds.  Abdominal:     General: Abdomen is flat.     Palpations: Abdomen is soft.     Tenderness: There is no abdominal tenderness.  Musculoskeletal:     Cervical back: Normal range of motion and neck supple.     Comments: Contracted appearing bilateral LE  Skin:    General: Skin is warm and dry.     Comments: Large necrotic L breast mass with foul-smelling purulent drainage and maggots within the wound Two Stage 3 ~3 cm diameter sacral decubitus ulcers with no significant drainage or surrounding erythema or warmth  Neurological:     Comments: Unable to answer orientation questions, not following commands, withdrawing in all 4 extremities        ED Results / Procedures / Treatments   Labs (all labs ordered are listed, but only abnormal results are displayed) Labs Reviewed  LACTIC ACID, PLASMA - Abnormal; Notable for the following components:      Result Value   Lactic Acid, Venous 3.2 (*)    All other components within normal limits  COMPREHENSIVE METABOLIC PANEL - Abnormal; Notable for the following components:   Sodium 128 (*)    Potassium 5.5 (*)    Chloride 97 (*)    CO2 17 (*)    Glucose, Bld 135 (*)    BUN 73 (*)    Creatinine, Ser 1.08 (*)    Albumin 2.4 (*)    AST 78 (*)    Alkaline Phosphatase 216 (*)    Total Bilirubin 1.3 (*)    GFR, Estimated 57 (*)    All other components within normal limits  CBC WITH DIFFERENTIAL/PLATELET - Abnormal; Notable for the following components:   WBC  32.1 (*)    MCH 24.8 (*)    RDW 18.5 (*)    Platelets 99 (*)    Neutro Abs 28.4 (*)    Basophils Absolute 0.2 (*)    Abs Immature Granulocytes 1.11 (*)    All other components within normal limits  PROTIME-INR - Abnormal; Notable for the following components:   Prothrombin Time 17.3 (*)    INR 1.4 (*)    All other components within normal limits  RESP PANEL BY RT-PCR (RSV, FLU A&B, COVID)  RVPGX2  CULTURE, BLOOD (ROUTINE X 2)  CULTURE, BLOOD (ROUTINE X 2)  APTT  LACTIC ACID, PLASMA  BLOOD GAS, VENOUS  URINALYSIS, W/ REFLEX TO CULTURE (INFECTION SUSPECTED)  I-STAT CHEM 8, ED    EKG None  Radiology DG Chest Port 1 View  Result Date: 05/07/2022 CLINICAL DATA:  Altered EXAM: PORTABLE CHEST 1 VIEW COMPARISON:  07/01/2021 FINDINGS: Asymmetric soft tissue density over left chest likely corresponding to history of breast mass. New heterogeneous bilateral airspace opacities. No pleural effusion or pneumothorax. Normal cardiac size. Faint bilateral nodular lung opacities are concerning for metastatic disease. IMPRESSION: 1. Faint bilateral nodular lung opacities are concerning for metastatic disease. Diffuse heterogeneous interstitial and ground-glass opacities either due to pneumonia or metastatic disease. 2. Asymmetric soft tissue density over the left chest likely corresponding to history of breast mass. Electronically Signed   By: Donavan Foil M.D.   On: 05/07/2022 23:32    Procedures .Critical Care  Performed by: Kemper Durie, DO Authorized by: Kemper Durie, DO   .Critical Care  Performed by: Kemper Durie, DO Authorized by: Kemper Durie, DO   Critical care provider statement:    Critical care time (minutes):  40   Critical care time was exclusive of:  Separately billable procedures and treating other patients   Critical care was necessary to treat or prevent imminent or life-threatening deterioration of the following conditions:  Sepsis   Critical  care was time spent personally by me on the following activities:  Development of treatment plan with patient or surrogate, evaluation of patient's response to treatment, examination of patient, obtaining history from patient or surrogate, ordering and performing treatments and interventions, ordering and review of laboratory studies, pulse oximetry, ordering and review of radiographic studies, re-evaluation of patient's condition and review of old charts   I assumed direction of critical care for this patient from another provider in my specialty: no       Medications Ordered in ED Medications  lactated ringers infusion (has no administration in time range)  lactated ringers bolus 1,000 mL (1,000 mLs Intravenous New Bag/Given 05/07/22 2320)    And  lactated ringers bolus 500 mL (has no administration in time range)    And  lactated ringers bolus 250 mL (has no administration in time range)  sodium chloride (PF) 0.9 % injection (has no administration in time range)  vancomycin (VANCOCIN) IVPB 1000 mg/200 mL premix (1,000 mg Intravenous New Bag/Given 05/07/22 2319)  ceFEPIme (MAXIPIME) 2 g in sodium chloride 0.9 % 100 mL IVPB (0 g Intravenous Stopped 05/07/22 2328)  iohexol (OMNIPAQUE) 300 MG/ML solution 75 mL (75 mLs Intravenous Contrast Given 05/08/22 0015)    ED Course/ Medical Decision Making/ A&P                             Medical Decision Making This patient presents to the ED with chief complaint(s) of AMS with pertinent past medical history of breast cancer not on chemotherapy which further complicates the presenting complaint. The complaint involves an extensive differential diagnosis and also carries with it a high risk of complications and morbidity.    The differential diagnosis includes sepsis, dehydration, electrolyte abnormality, purulent cellulitis versus abscess of the breast, possible sacral decubitus ulcers as a source, ACS, arrhythmia, anemia  Additional history  obtained: Additional history obtained from EMS  Records reviewed outpatient oncology records  ED  Course and Reassessment: Patient's arrival to the emergency department she was afebrile tachycardic with soft blood pressures in the 0000000 systolic.  She is frail and cachectic appearing and had a purulent left breast mass with maggots.  Her wounds were cleaned and Wagner's were removed and a clean dressing were placed.  Concern for her breast masses possible source of infection and she was started on sepsis workup with blood cultures, lactic and started on IV fluids and broad-spectrum antibiotics.  Independent labs interpretation:  The following labs were independently interpreted: mild hyponatremia, elevated lactic acid, leukocytosis  Independent visualization of imaging: - I independently visualized the following imaging with scope of interpretation limited to determining acute life threatening conditions related to emergency care: CXR, which revealed pneumonia vs metastatic disease in the lungs     Amount and/or Complexity of Data Reviewed Labs: ordered. Radiology: ordered. ECG/medicine tests: ordered.  Risk Prescription drug management.          Final Clinical Impression(s) / ED Diagnoses Final diagnoses:  None    Rx / DC Orders ED Discharge Orders     None         Kemper Durie, DO 05/08/22 [id number]

## 2022-05-07 NOTE — ED Triage Notes (Signed)
BIBA from home family reports recent dx of breast cancer, Pt does not want txt, family states she was walking with one assist, and perform own ADLs as of yesterday and today she is altered, has Rt mastectomy and lt breast has wound with maggots    BP 140/98 then went to 105/73 Hr 120-130 End tidal 17 Resp 30 Spo2 94 RA

## 2022-05-07 NOTE — Sepsis Progress Note (Signed)
[facility] following for sepsis protocol. 

## 2022-05-08 ENCOUNTER — Emergency Department (HOSPITAL_COMMUNITY): Payer: 59

## 2022-05-08 DIAGNOSIS — R64 Cachexia: Secondary | ICD-10-CM | POA: Diagnosis present

## 2022-05-08 DIAGNOSIS — R6520 Severe sepsis without septic shock: Secondary | ICD-10-CM

## 2022-05-08 DIAGNOSIS — Z515 Encounter for palliative care: Secondary | ICD-10-CM | POA: Diagnosis not present

## 2022-05-08 DIAGNOSIS — N281 Cyst of kidney, acquired: Secondary | ICD-10-CM | POA: Diagnosis not present

## 2022-05-08 DIAGNOSIS — Z7189 Other specified counseling: Secondary | ICD-10-CM

## 2022-05-08 DIAGNOSIS — Z66 Do not resuscitate: Secondary | ICD-10-CM | POA: Diagnosis not present

## 2022-05-08 DIAGNOSIS — L89894 Pressure ulcer of other site, stage 4: Secondary | ICD-10-CM | POA: Diagnosis present

## 2022-05-08 DIAGNOSIS — R6521 Severe sepsis with septic shock: Secondary | ICD-10-CM | POA: Diagnosis present

## 2022-05-08 DIAGNOSIS — A419 Sepsis, unspecified organism: Secondary | ICD-10-CM

## 2022-05-08 DIAGNOSIS — D696 Thrombocytopenia, unspecified: Secondary | ICD-10-CM | POA: Diagnosis present

## 2022-05-08 DIAGNOSIS — E871 Hypo-osmolality and hyponatremia: Secondary | ICD-10-CM | POA: Diagnosis present

## 2022-05-08 DIAGNOSIS — K3189 Other diseases of stomach and duodenum: Secondary | ICD-10-CM | POA: Diagnosis not present

## 2022-05-08 DIAGNOSIS — D72829 Elevated white blood cell count, unspecified: Secondary | ICD-10-CM | POA: Diagnosis present

## 2022-05-08 DIAGNOSIS — E875 Hyperkalemia: Secondary | ICD-10-CM | POA: Diagnosis present

## 2022-05-08 DIAGNOSIS — E872 Acidosis, unspecified: Secondary | ICD-10-CM | POA: Diagnosis present

## 2022-05-08 DIAGNOSIS — R532 Functional quadriplegia: Secondary | ICD-10-CM | POA: Diagnosis present

## 2022-05-08 DIAGNOSIS — W19XXXS Unspecified fall, sequela: Secondary | ICD-10-CM | POA: Diagnosis present

## 2022-05-08 DIAGNOSIS — G936 Cerebral edema: Secondary | ICD-10-CM | POA: Diagnosis present

## 2022-05-08 DIAGNOSIS — E43 Unspecified severe protein-calorie malnutrition: Secondary | ICD-10-CM | POA: Diagnosis present

## 2022-05-08 DIAGNOSIS — L89213 Pressure ulcer of right hip, stage 3: Secondary | ICD-10-CM | POA: Diagnosis present

## 2022-05-08 DIAGNOSIS — C50912 Malignant neoplasm of unspecified site of left female breast: Secondary | ICD-10-CM | POA: Diagnosis present

## 2022-05-08 DIAGNOSIS — L89154 Pressure ulcer of sacral region, stage 4: Secondary | ICD-10-CM | POA: Diagnosis present

## 2022-05-08 DIAGNOSIS — C7931 Secondary malignant neoplasm of brain: Secondary | ICD-10-CM | POA: Diagnosis present

## 2022-05-08 DIAGNOSIS — R918 Other nonspecific abnormal finding of lung field: Secondary | ICD-10-CM | POA: Diagnosis not present

## 2022-05-08 DIAGNOSIS — N280 Ischemia and infarction of kidney: Secondary | ICD-10-CM | POA: Diagnosis present

## 2022-05-08 DIAGNOSIS — A4159 Other Gram-negative sepsis: Secondary | ICD-10-CM | POA: Diagnosis present

## 2022-05-08 DIAGNOSIS — R4182 Altered mental status, unspecified: Secondary | ICD-10-CM | POA: Diagnosis present

## 2022-05-08 DIAGNOSIS — L89100 Pressure ulcer of unspecified part of back, unstageable: Secondary | ICD-10-CM | POA: Diagnosis present

## 2022-05-08 DIAGNOSIS — D630 Anemia in neoplastic disease: Secondary | ICD-10-CM | POA: Diagnosis present

## 2022-05-08 DIAGNOSIS — L89122 Pressure ulcer of left upper back, stage 2: Secondary | ICD-10-CM | POA: Diagnosis present

## 2022-05-08 DIAGNOSIS — Z681 Body mass index (BMI) 19 or less, adult: Secondary | ICD-10-CM | POA: Diagnosis not present

## 2022-05-08 DIAGNOSIS — I96 Gangrene, not elsewhere classified: Secondary | ICD-10-CM | POA: Diagnosis present

## 2022-05-08 DIAGNOSIS — L89896 Pressure-induced deep tissue damage of other site: Secondary | ICD-10-CM | POA: Diagnosis present

## 2022-05-08 DIAGNOSIS — Z1152 Encounter for screening for COVID-19: Secondary | ICD-10-CM | POA: Diagnosis not present

## 2022-05-08 LAB — BASIC METABOLIC PANEL
Anion gap: 10 (ref 5–15)
BUN: 65 mg/dL — ABNORMAL HIGH (ref 8–23)
CO2: 15 mmol/L — ABNORMAL LOW (ref 22–32)
Calcium: 8.4 mg/dL — ABNORMAL LOW (ref 8.9–10.3)
Chloride: 104 mmol/L (ref 98–111)
Creatinine, Ser: 0.78 mg/dL (ref 0.44–1.00)
GFR, Estimated: >60 mL/min (ref >60)
Glucose, Bld: 103 mg/dL — ABNORMAL HIGH (ref 70–99)
Potassium: 5.1 mmol/L (ref 3.5–5.1)
Sodium: 129 mmol/L — ABNORMAL LOW (ref 135–145)

## 2022-05-08 LAB — URINALYSIS, W/ REFLEX TO CULTURE (INFECTION SUSPECTED)
Bilirubin Urine: NEGATIVE
Glucose, UA: NEGATIVE mg/dL
Ketones, ur: NEGATIVE mg/dL
Nitrite: NEGATIVE
Protein, ur: 30 mg/dL — AB
Specific Gravity, Urine: 1.04 — ABNORMAL HIGH (ref 1.005–1.030)
WBC, UA: >50 WBC/hpf (ref 0–5)
pH: 5 (ref 5.0–8.0)

## 2022-05-08 LAB — BLOOD GAS, VENOUS
Acid-base deficit: 8.8 mmol/L — ABNORMAL HIGH (ref 0.0–2.0)
Bicarbonate: 16.7 mmol/L — ABNORMAL LOW (ref 20.0–28.0)
O2 Saturation: 84.2 %
Patient temperature: 37
pCO2, Ven: 34 mmHg — ABNORMAL LOW (ref 44–60)
pH, Ven: 7.3 (ref 7.25–7.43)
pO2, Ven: 53 mmHg — ABNORMAL HIGH (ref 32–45)

## 2022-05-08 LAB — BLOOD CULTURE ID PANEL (REFLEXED) - BCID2

## 2022-05-08 LAB — CBC
HCT: 31.8 % — ABNORMAL LOW (ref 36.0–46.0)
Hemoglobin: 9.5 g/dL — ABNORMAL LOW (ref 12.0–15.0)
MCH: 24.7 pg — ABNORMAL LOW (ref 26.0–34.0)
MCHC: 29.9 g/dL — ABNORMAL LOW (ref 30.0–36.0)
MCV: 82.8 fL (ref 80.0–100.0)
Platelets: 54 10*3/uL — ABNORMAL LOW (ref 150–400)
RBC: 3.84 MIL/uL — ABNORMAL LOW (ref 3.87–5.11)
RDW: 18.4 % — ABNORMAL HIGH (ref 11.5–15.5)
WBC: 34.1 10*3/uL — ABNORMAL HIGH (ref 4.0–10.5)
nRBC: 0.1 % (ref 0.0–0.2)

## 2022-05-08 LAB — RESP PANEL BY RT-PCR (RSV, FLU A&B, COVID)  RVPGX2
Influenza A by PCR: NEGATIVE
Influenza B by PCR: NEGATIVE
Resp Syncytial Virus by PCR: NEGATIVE
SARS Coronavirus 2 by RT PCR: NEGATIVE

## 2022-05-08 LAB — LACTIC ACID, PLASMA
Lactic Acid, Venous: 3.5 mmol/L (ref 0.5–1.9)
Lactic Acid, Venous: 2.3 mmol/L (ref 0.5–1.9)
Lactic Acid, Venous: 2.6 mmol/L (ref 0.5–1.9)

## 2022-05-08 LAB — MRSA NEXT GEN BY PCR, NASAL: MRSA by PCR Next Gen: DETECTED — AB

## 2022-05-08 MED ORDER — POLYETHYLENE GLYCOL 3350 17 G PO PACK: 17.0000 g | PACK | Freq: Every day | ORAL | Status: DC | PRN

## 2022-05-08 MED ORDER — ENOXAPARIN SODIUM 40 MG/0.4ML IJ SOSY
40.0000 mg | PREFILLED_SYRINGE | Freq: Every day | INTRAMUSCULAR | Status: DC
  Administered 2022-05-08 – 2022-05-09 (×2): 40 mg via SUBCUTANEOUS
  Filled 2022-05-08 (×2): qty 0.4

## 2022-05-08 MED ORDER — DOCUSATE SODIUM 100 MG PO CAPS: 100.0000 mg | ORAL_CAPSULE | Freq: Two times a day (BID) | ORAL | Status: DC

## 2022-05-08 MED ORDER — ZINC SULFATE 220 (50 ZN) MG PO CAPS: 220.0000 mg | ORAL_CAPSULE | Freq: Every day | ORAL | Status: DC

## 2022-05-08 MED ORDER — ACETAMINOPHEN 325 MG PO TABS: 650.0000 mg | ORAL_TABLET | Freq: Four times a day (QID) | ORAL | Status: DC | PRN

## 2022-05-08 MED ORDER — SODIUM CHLORIDE 0.9 % IV BOLUS
1000.0000 mL | Freq: Once | INTRAVENOUS | Status: AC
  Administered 2022-05-08: 1000 mL via INTRAVENOUS

## 2022-05-08 MED ORDER — MELATONIN 5 MG PO TABS: 10.0000 mg | ORAL_TABLET | Freq: Every day | ORAL | Status: DC

## 2022-05-08 MED ORDER — VITAMIN C 500 MG PO TABS: 500.0000 mg | ORAL_TABLET | Freq: Every day | ORAL | Status: DC

## 2022-05-08 MED ORDER — ASPIRIN 81 MG PO CHEW: 81.0000 mg | CHEWABLE_TABLET | Freq: Every day | ORAL | Status: DC

## 2022-05-08 MED ORDER — POLYVINYL ALCOHOL 1.4 % OP SOLN: 1.0000 [drp] | Freq: Every day | OPHTHALMIC | Status: DC | PRN

## 2022-05-08 MED ORDER — IOHEXOL 300 MG/ML  SOLN
75.0000 mL | Freq: Once | INTRAMUSCULAR | Status: AC | PRN
  Administered 2022-05-08: 75 mL via INTRAVENOUS

## 2022-05-08 MED ORDER — OXYCODONE-ACETAMINOPHEN 5-325 MG PO TABS: 2.0000 | ORAL_TABLET | Freq: Four times a day (QID) | ORAL | Status: DC | PRN

## 2022-05-08 MED ORDER — CHLORHEXIDINE GLUCONATE CLOTH 2 % EX PADS
6.0000 | MEDICATED_PAD | Freq: Every day | CUTANEOUS | Status: DC
  Administered 2022-05-08: 6 via TOPICAL

## 2022-05-08 MED ORDER — QUETIAPINE FUMARATE 50 MG PO TABS: 50.0000 mg | ORAL_TABLET | Freq: Every day | ORAL | Status: DC

## 2022-05-08 MED ORDER — SODIUM CHLORIDE 0.9 % IV SOLN
2.0000 g | Freq: Two times a day (BID) | INTRAVENOUS | Status: DC
  Administered 2022-05-08 (×2): 2 g via INTRAVENOUS
  Filled 2022-05-08 (×2): qty 12.5

## 2022-05-08 MED ORDER — MORPHINE SULFATE (PF) 2 MG/ML IV SOLN
2.0000 mg | INTRAVENOUS | Status: DC | PRN
  Administered 2022-05-10 – 2022-05-11 (×2): 2 mg via INTRAVENOUS
  Filled 2022-05-08 (×2): qty 1

## 2022-05-08 MED ORDER — ONDANSETRON HCL 4 MG PO TABS: 4.0000 mg | ORAL_TABLET | Freq: Four times a day (QID) | ORAL | Status: DC | PRN

## 2022-05-08 MED ORDER — VANCOMYCIN HCL IN DEXTROSE 1-5 GM/200ML-% IV SOLN: 1000.0000 mg | INTRAVENOUS | Status: DC

## 2022-05-08 MED ORDER — ONDANSETRON HCL 4 MG/2ML IJ SOLN: 4.0000 mg | Freq: Four times a day (QID) | INTRAMUSCULAR | Status: DC | PRN

## 2022-05-08 MED ORDER — ALBUTEROL SULFATE (2.5 MG/3ML) 0.083% IN NEBU: 2.5000 mg | INHALATION_SOLUTION | RESPIRATORY_TRACT | Status: DC | PRN

## 2022-05-08 MED ORDER — DAKINS (1/4 STRENGTH) 0.125 % EX SOLN
Freq: Two times a day (BID) | CUTANEOUS | Status: DC
  Filled 2022-05-08: qty 473

## 2022-05-08 MED ORDER — MEDIHONEY WOUND/BURN DRESSING EX PSTE
1.0000 | PASTE | Freq: Every day | CUTANEOUS | Status: DC
  Administered 2022-05-08: 1 via TOPICAL
  Filled 2022-05-08: qty 44

## 2022-05-08 MED ORDER — ADULT MULTIVITAMIN W/MINERALS CH: 1.0000 | ORAL_TABLET | Freq: Every day | ORAL | Status: DC

## 2022-05-08 NOTE — Consult Note (Signed)
[facility] Nurse Consult Note: Reason for Consult: multiple pressure injures and large fungating breast cancer Wound type: Stage 4 Pressure injury: sacrum Unstageable Pressure injury: lumbar spine Unstageable Pressure injury: left iliac crest posterior Stage 3 Pressure injury: right ilia crest posterior Deep Tissue Pressure injury: between the lumbar spine and the right iliac crest  Fungating liquefied necrosis of the chest wall (left) 1 large area, distal opening, nodules over the chest wall Pressure Injury POA: Yes Measurement: see nursing flow sheets Wound bed: see above  Drainage (amount, consistency, odor) heavy, moderate, purulent chest wall; scant per nursing for the sacrum/coccyx/iliac crest wounds  Periwound: intact  Dressing procedure/placement/frequency: Sacral/coccyx/iliac crest: Medihoney and saline moist dressing to promote moist wound healing and clear non viable tissue. Change daily Low air loss mattress in place for moisture management and pressure redistribution while in the ICU, to be ordered if patient transfers 1/4% sodium hypochlorite to the breast tumor; cover with ABD pads; conservative/palliative treatment. Do not feel she will be surgical candidate based on widespread metastatic disease. Both treatments can be used with hospice care at home. Would suggest air mattress at home if possible and DC with hospice care.   Discussed POC with patient and bedside nurse. Re consult if needed, will not follow at this time. Thanks  Jaclyne Haverstick [employer], RN,CWOCN, CNS, [place] [phone])

## 2022-05-08 NOTE — Progress Notes (Signed)
During dressing change, 2 maggots were seen and removed from patient's left breast wound.

## 2022-05-08 NOTE — ED Notes (Signed)
Pt's bandages removed by provider and Maggotts were observed in the wound, wound cleaned with sterile water and Maggotts suctioned into container, ABD pads placed on wounds.

## 2022-05-08 NOTE — Progress Notes (Signed)
Pharmacy Antibiotic Note  April Graves is a 67 y.o. female admitted on 05/07/2022 with a past medical history of breast cancer not on chemotherapy presenting to the emergency department from home with altered mental status. EMS noted a foul-smelling necrotic wound to her left breast with maggots as well as sacral decubitus wound. .  Pharmacy has been consulted for vancomycin and cefepime dosing.  Plan: Vancomycin 1gm IV x 1 then 1gm q24h (AUC 488.8, Scr 1.08) Cefepime 2gm IV q12h Follow renal function, cultures and clinical course  Height: 5\' 6"  (167.6 cm) IBW/kg (Calculated) : 59.3  Temp (24hrs), Avg:97.5 F (36.4 C), Min:96.8 F (36 C), Max:98.1 F (36.7 C)  Recent Labs  Lab 05/07/22 2241  WBC 32.1*  CREATININE 1.08*  LATICACIDVEN 3.2*    CrCl cannot be calculated (Unknown ideal weight.).    Allergies  Allergen Reactions   Codeine Anxiety and Other (See Comments)   Lisinopril Anxiety and Other (See Comments)    Heart Races    Pork-Derived Products     Patient reports she does not eat pork     Antimicrobials this admission: 4/1 vanc >> 4/1 cefepime >>  Dose adjustments this admission:   Microbiology results: 4/1 BCx:   Thank you for allowing pharmacy to be a part of this patient's care.  Dolly Rias RPh 05/08/2022, 12:52 AM
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
## 2022-05-08 NOTE — ED Notes (Signed)
Assumed care of pt. Pt laying in bed resting with family by bedside. Equal chest rise and fall. No obvious distress noted.

## 2022-05-08 NOTE — H&P (Signed)
History and Physical  April Graves [id number] DOB: 01-12-1956 DOA: 05/07/2022  PCP: Pcp, No   Chief Complaint: Trouble swallowing  HPI: April Graves is a 67 y.o. female with medical history significant for functional quadriplegia since a fall in 2010, widely metastatic breast cancer not currently on chemotherapy presenting to the emergency department with complaints of altered mental status and difficulty swallowing.  Family called EMS with concerns for the patient having pain and difficulty with swallowing, but on their arrival the patient was altered, unable to provide any history, severely emaciated.  EMS noted foul-smelling necrotic wound to the left breast with maggots as well as a deep sacral decubitus wound.  Family states patient has been able to ambulate until very recently.  She says she has been eating and drinking the last couple of days but complaining of throat pain and occasionally sounding like she is choking when drinking.  On arrival in the ED, ER staff spoke with the patient's son over the phone, he is healthcare power of attorney and stated patient is full code.  ED Course: Patient was evaluated in the ER, several maggots were suctioned from her anterior large chest wound.  Pictures are available in the ER provider note.  Family states they have been changing the dressings on their own.  Patient has remained very somnolent, not able to provide any history.  Initial vital signs temperature 95, heart rate 108, blood pressure stable 101/71, saturating 92% on room air.  Lab work was performed, shows white blood cell count 32,000, hemoglobin 12, platelets 99, sodium 125, potassium 5.5, creatinine 1.08, INR 1.4, initial lactate 3.5, now improved to 2.6.  She was given empiric IV cefepime and vancomycin, as well as IV fluids.  Hospitalist was contacted for admission.  Review of Systems: Please see HPI for pertinent positives and negatives. A complete 10 system review of systems are  otherwise negative.  Past Medical History:  Diagnosis Date   Anemia of chronic disease    Chronic pain syndrome    Functional quadriplegia    noted in d/c summary 03/2015   History of decubitus ulcer    infected, left glut/hip   History of malnutrition    Osteoarthritis of both shoulders    Poor dentition    Secondary osteoarthritis of left ankle    Post-traumatic; at the tibiotalar joint.  + severe disuse osteopenia.   Past Surgical History:  Procedure Laterality Date   KNEE CLOSED REDUCTION  10/16/2011   No fracture.  Procedure: CLOSED MANIPULATION KNEE;  Surgeon: Mauri Pole, MD;  Location: [facility];  Service: Orthopedics;;  Proxmial Fibula   TUBAL LIGATION      Social History:  reports that she has never smoked. She has never used smokeless tobacco. She reports that she does not drink alcohol and does not use drugs.   Allergies  Allergen Reactions   Codeine Anxiety and Other (See Comments)   Lisinopril Anxiety and Other (See Comments)    Heart Races    Pork-Derived Products     Patient reports she does not eat pork     Family History  Problem Relation Age of Onset   Hypertension Other      Prior to Admission medications   Medication Sig Start Date End Date Taking? Authorizing Provider  acetaminophen (TYLENOL) 325 MG tablet Take 2 tablets (650 mg total) by mouth every 6 (six) hours as needed for mild pain (or Fever >/= 101). 03/13/15   Robbie Lis, MD  albuterol (PROVENTIL HFA;VENTOLIN HFA) 108 (90 Base) MCG/ACT inhaler Inhale 1-2 puffs into the lungs every 6 (six) hours as needed for wheezing or shortness of breath.    [provider]  Artificial Saliva (BIOTENE DRY MOUTH) LOZG Use as directed 1 tablet in the mouth or throat daily.    [provider]  aspirin 81 MG chewable tablet Chew 1 tablet (81 mg total) by mouth daily. 07/03/21   Patrecia Pour, MD  atorvastatin (LIPITOR) 40 MG tablet Take 1 tablet (40 mg total) by mouth daily. 07/03/21   Patrecia Pour, MD  diclofenac Sodium (VOLTAREN) 1 % GEL 2 g 4 (four) times daily as needed (pain). 07/13/19   [provider]  erythromycin ophthalmic ointment Place into both eyes 4 (four) times daily. Place a 1/2 inch ribbon of ointment into the lower eyelid. Patient taking differently: Place 1 application. into both eyes 2 (two) times daily. Place a 1/2 inch ribbon of ointment into the lower eyelid. 05/02/21   Truddie Hidden, MD  LUBRICANT EYE DROPS 0.4-0.3 % SOLN Place 1 drop into both eyes daily as needed (eye lubricant). 02/21/17   [provider]  Melatonin 5 MG CHEW Chew 10 mg by mouth at bedtime.    [provider]  Multiple Vitamin (MULTIVITAMIN WITH MINERALS) TABS tablet Take 1 tablet by mouth daily.    [provider]  oxyCODONE-acetaminophen (PERCOCET/ROXICET) 5-325 MG tablet Take 2 tablets by mouth every 6 (six) hours as needed for moderate pain. 03/03/17   Doreatha Lew, MD  QUEtiapine (SEROQUEL) 50 MG tablet Take 1 tablet (50 mg total) by mouth at bedtime. 07/03/21   Patrecia Pour, MD    Physical Exam: BP 99/71   Pulse (!) 127   Temp 98.8 F (37.1 C) (Rectal)   Resp 20   Ht 5\' 6"  (1.676 m)   SpO2 92%   BMI 22.60 kg/m   General: Severely cachectic, obtunded Eyes: EOMI, clear conjuctivae, white sclerea Cardiovascular: RRR, no murmurs or rubs, no peripheral edema  Respiratory: clear to auscultation bilaterally, no wheezes, no crackles  Abdomen: soft, nontender, nondistended, normal bowel tones heard  Skin: Large necrotic wound on the anterior chest, pictures reviewed, dressing not removed Musculoskeletal: no joint effusions, normal range of motion  Neurologic: extraocular muscles intact, clear speech, moving all extremities with intact sensorium          Labs on Admission:  Basic Metabolic Panel: Recent Labs  Lab 05/07/22 2241  NA 128*  K 5.5*  CL 97*  CO2 17*  GLUCOSE 135*  BUN 73*  CREATININE 1.08*  CALCIUM 9.4   Liver  Function Tests: Recent Labs  Lab 05/07/22 2241  AST 78*  ALT 13  ALKPHOS 216*  BILITOT 1.3*  PROT 7.2  ALBUMIN 2.4*   No results for input(s): "LIPASE", "AMYLASE" in the last 168 hours. No results for input(s): "AMMONIA" in the last 168 hours. CBC: Recent Labs  Lab 05/07/22 2241  WBC 32.1*  NEUTROABS 28.4*  HGB 12.1  HCT 39.3  MCV 80.5  PLT 99*   Cardiac Enzymes: No results for input(s): "CKTOTAL", "CKMB", "CKMBINDEX", "TROPONINI" in the last 168 hours.  BNP (last 3 results) No results for input(s): "BNP" in the last 8760 hours.  ProBNP (last 3 results) No results for input(s): "PROBNP" in the last 8760 hours.  CBG: No results for input(s): "GLUCAP" in the last 168 hours.  Radiological Exams on Admission: CT Head Wo Contrast  Result Date:  05/08/2022 CLINICAL DATA:  Altered mental status EXAM: CT HEAD WITHOUT CONTRAST TECHNIQUE: Contiguous axial images were obtained from the base of the skull through the vertex without intravenous contrast. RADIATION DOSE REDUCTION: This exam was performed according to the departmental dose-optimization program which includes automated exposure control, adjustment of the mA and/or kV according to patient size and/or use of iterative reconstruction technique. COMPARISON:  07/02/2021 CTA head FINDINGS: Brain: Hyperdense lesion in the right frontal lobe, which is not well defined but measures approximately 2.1 x 2.2 x 1.4 cm (AP x TR x CC) (series 5, image 21 and series 4, image 19), which is new from the prior exam. Suspect a second hyperdense lesion in the right temporal lobe, measuring approximately 1.2 x 1.6 x 1.2 cm (series 5, image 11 and series 4, image 31), which is also associated with edema. No acute infarct, hemorrhage, or midline shift. No hydrocephalus or extra-axial collection Vascular: No hyperdense vessel. Atherosclerotic calcifications in the intracranial carotid and vertebral arteries. Skull: Diffusely abnormal calvarium, with a  large lytic area with associated soft tissue mass at the parietal vertex, which measures approximately 4.0 x 3.8 x 1.3 cm (series 5, image 26 and series 4, image 44), previously 2.6 x 2.3 x 0.8 cm. The lytic lesion now extends through the full thickness of the calvarium Sinuses/Orbits: Clear paranasal sinuses. Other: No acute finding in the orbits. IMPRESSION: 1. New hyperdense lesions in the right frontal and temporal lobes, concerning for metastatic disease. MRI with and without contrast is recommended for further evaluation. 2. Interval increase in size of a large lytic lesion with associated soft tissue mass at the parietal vertex, which now extends through the full thickness of the calvarium. 3. No acute infarct or hemorrhage. No midline shift. These results were called by telephone at the time of interpretation on 05/08/2022 at 3:15 am to provider [facility] , who verbally acknowledged these results. Electronically Signed   By: Merilyn Baba M.D.   On: 05/08/2022 03:16   CT CHEST ABDOMEN PELVIS W CONTRAST  Result Date: 05/08/2022 CLINICAL DATA:  Sepsis, left breast wound with maggots, history of left breast cancer, leukocytosis EXAM: CT CHEST, ABDOMEN, AND PELVIS WITH CONTRAST TECHNIQUE: Multidetector CT imaging of the chest, abdomen and pelvis was performed following the standard protocol during bolus administration of intravenous contrast. RADIATION DOSE REDUCTION: This exam was performed according to the departmental dose-optimization program which includes automated exposure control, adjustment of the mA and/or kV according to patient size and/or use of iterative reconstruction technique. CONTRAST:  10mL OMNIPAQUE IOHEXOL 300 MG/ML  SOLN COMPARISON:  CT chest 07/01/2021 FINDINGS: CT CHEST FINDINGS Cardiovascular: Mild coronary artery calcification. Global cardiac size within normal limits. No pericardial effusion. Central pulmonary arteries are of normal caliber. Thoracic aorta is unremarkable.  Mediastinum/Nodes: Visualized thyroid is unremarkable. There has developed extensive pathologic mediastinal, bilateral hilar, and left axillary and subpectoral adenopathy in keeping with widespread thoracic nodal metastases. Pathologic adenopathy within the left hilum results in significant mass effect upon the mid esophagus. The esophagus is otherwise unremarkable. Lungs/Pleura: There are innumerable bilateral pulmonary nodules in keeping with widespread pulmonary metastases with irregular septal thickening noted within the lung apices suspicious for superimposed lymphangitic spread of malignancy. Multiple pleural soft tissue nodules are seen bilaterally in keeping with pleural metastatic disease. No pneumothorax or pleural effusion. There is superimposed nodular peribronchial airspace infiltrate within the right upper lobe which is associated with asymmetric bronchial wall thickening in may represent a superimposed right upper lobe bronchopneumonia.  No central obstructing lesion. Musculoskeletal: There are sclerotic lesions involving the mid and distal right clavicle, left sixth rib, and the T3 and T8 vertebral bodies in keeping with osseous metastatic disease. Additionally, there is a destructive soft tissue mass which erodes the body of the sternum in keeping with a lytic osseous metastasis. This appears confluent with the dominant mass within the left breast and extends into the anterior mediastinum to abut the anterior pericardium. The previously noted left breast mass has markedly enlarged, now essentially replacing the left breast tissue, abutting the left chest wall and extending into the subpleural soft tissues and eroding through the dermis with extensive superficial ulceration. Heterogeneous enhancement of the mass suggests widespread areas of mucinous or necrotic degeneration. CT ABDOMEN PELVIS FINDINGS Hepatobiliary: No focal liver abnormality is seen. No gallstones, gallbladder wall thickening, or  biliary dilatation. Pancreas: Unremarkable Spleen: Evaluation of the spleen is limited by motion artifact, however, a wedge-shaped defect within the upper pole the spleen is in keeping with a acute to subacute splenic infarct. Adrenals/Urinary Tract: The adrenal glands are unremarkable. The kidneys are normal in size and position. Multiple wedge-shaped cortical defects are seen within the kidneys bilaterally, best appreciated within the upper pole of the right kidney and posterior interpolar region of the left kidney in keeping with multiple renal infarcts. 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 Multiple nonobstructing calculi are seen within the right kidney measuring up to 10 mm in greatest dimension. No additional renal or ureteral calculi. No hydronephrosis. The bladder is unremarkable. Stomach/Bowel: Large stool ball within the rectal vault suggests fecal impaction. Moderate stool burden. The stomach, small bowel, and large bowel are otherwise unremarkable. No free intraperitoneal gas or fluid. Vascular/Lymphatic: Aortic atherosclerosis. No enlarged abdominal or pelvic lymph nodes. Reproductive: Multiple calcified involuted uterine fibroids are seen within the uterus. No adnexal masses. Other: No abdominal wall hernia. Musculoskeletal: Large sacral decubitus ulcer is present with subcutaneous foci of gas both within the a soft tissues dorsal to the sacrum as well as within the presacral space. There is marked attenuation of the overlying soft tissue and probable exposure of the sacrum with extensive lytic destruction of the mid and distal sacrum. 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 Advanced degenerative arthritis is noted of the hips bilaterally with  marked remodeling of the humeral heads. Sclerotic metastases are seen within the L1, L2 and possibly the L4 vertebral bodies., IMPRESSION: 1. Marked interval enlargement of the previously noted left breast mass, now essentially replacing the left breast tissue, abutting the left chest wall and extending into the subpleural soft tissues and eroding through the dermis with extensive superficial ulceration. Heterogeneous enhancement of the mass suggests widespread areas of mucinous or necrotic degeneration. 2. Interval development of extensive pathologic mediastinal, bilateral hilar, and left axillary and subpectoral adenopathy in keeping with widespread thoracic nodal metastases. Pathologic adenopathy within the left hilum results in significant mass effect upon the mid esophagus. 3. Innumerable bilateral pulmonary nodules in keeping with widespread pulmonary metastases. Irregular septal thickening within the lung apices suspicious for superimposed lymphangitic spread of malignancy. 4. Superimposed nodular peribronchial airspace infiltrate within the right upper lobe which is associated with asymmetric bronchial wall thickening in may represent a superimposed right upper lobe bronchopneumonia. 5. Multiple wedge-shaped cortical defects within the  kidneys bilaterally in keeping with multiple renal infarcts. 6. Acute to subacute splenic infarct. 7. Large sacral decubitus ulcer with marked attenuation of the overlying soft tissue and probable exposure of the sacrum with extensive lytic destruction of the mid and distal sacrum. 8. 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 Aortic Atherosclerosis (ICD10-I70.0). Electronically Signed   By: Fidela Salisbury M.D.   On: 05/08/2022 00:54   DG Chest Port 1 View  Result Date: 05/07/2022 CLINICAL DATA:  Altered  EXAM: PORTABLE CHEST 1 VIEW COMPARISON:  07/01/2021 FINDINGS: Asymmetric soft tissue density over left chest likely corresponding to history of breast mass. New heterogeneous bilateral airspace opacities. No pleural effusion or pneumothorax. Normal cardiac size. Faint bilateral nodular lung opacities are concerning for metastatic disease. IMPRESSION: 1. Faint bilateral nodular lung opacities are concerning for metastatic disease. Diffuse heterogeneous interstitial and ground-glass opacities either due to pneumonia or metastatic disease. 2. Asymmetric soft tissue density over the left chest likely corresponding to history of breast mass. Electronically Signed   By: Donavan Foil M.D.   On: 05/07/2022 23:32    Assessment/Plan Principal Problem:   Severe sepsis Widely metastatic end-stage breast cancer  This is an unfortunate 67 year old female with a prior history of functional quadriplegia after fall in 2010, PE as well as diagnosis in November of high-grade invasive ductal carcinoma.  This was first discovered in September 2022, but for some reason was not seen by medical oncology until October 2023.  In October 2023, she already was found to have possible metastatic disease on brain MRI.  During that visit in October, it was felt that patient was not a candidate for chemotherapy due to her very poor baseline functional status, there was some discussion about being able to take an oral palliative treatment, but according to the patient's son she declined further workup with PET scans, etc. that would be required to start the oral chemotherapy.  She chose to forego treatment, and has been living with her son and daughter since that time.  I had a very frank discussion with the patient's son, says that he is her healthcare power of attorney.  I explained to him that she has widely metastatic disease, and unfortunately she will die as a result of her widely metastatic and aggressive breast cancer.  I think he  understands this.  I strongly recommended that the patient's CODE STATUS be changed to DNR, and that we focus on comfort care.  He states that his mother has previously requested that when the time comes, she be kept comfortable and be allowed to die at home, she does not want to go to an inpatient hospice facility.  I explained that she may be too unstable to make this happen.  At the conclusion of our conversation, patient's son request some time to discuss with family and to contemplate our discussion.  He will get back to me soon about her CODE STATUS and goals of care.  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 Inpatient status is judged to be reasonable and necessary in order to provide the required intensity of service to ensure the patient's safety. The patient's presenting symptoms, physical exam findings, and initial radiographic and laboratory data in the context of their chronic comorbidities is felt to place them at high risk for further clinical deterioration. Furthermore, it is not anticipated that the patient will be medically stable for discharge from the hospital within 2 midnights of admission.     I certify that at the point of admission it is my clinical judgment that the patient will require inpatient hospital care spanning beyond 2 midnights from the point of admission due to high intensity of service, high risk for further deterioration and high frequency of surveillance required Minutes  Kylyn Mcdade Neva Seat MD [facility] Pager [phone]  If 7PM-7AM, please contact night-coverage [email or web address] Password [facility]  05/08/2022, 10:13 AM

## 2022-05-08 NOTE — Progress Notes (Signed)
PHARMACY - PHYSICIAN COMMUNICATION CRITICAL VALUE ALERT - BLOOD CULTURE IDENTIFICATION (BCID)  April Graves is an 67 y.o. female who presented to [facility] on 05/07/2022 with a chief complaint of AMS  Assessment:  2/4 gram negative rods  Name of physician (or Provider) Contacted: Clarene Essex  Current antibiotics: vanc and cefepime  Changes to prescribed antibiotics recommended:  Patient is on recommended antibiotics - No changes needed  Results for orders placed or performed during the hospital encounter of 05/07/22  Blood Culture ID Panel (Reflexed) (Collected: 05/07/2022 10:41 PM)  Result Value Ref Range   Enterococcus faecalis NOT DETECTED NOT DETECTED   Enterococcus Faecium NOT DETECTED NOT DETECTED   Listeria monocytogenes NOT DETECTED NOT DETECTED   Staphylococcus species NOT DETECTED NOT DETECTED   Staphylococcus aureus (BCID) NOT DETECTED NOT DETECTED   Staphylococcus epidermidis NOT DETECTED NOT DETECTED   Staphylococcus lugdunensis NOT DETECTED NOT DETECTED   Streptococcus species NOT DETECTED NOT DETECTED   Streptococcus agalactiae NOT DETECTED NOT DETECTED   Streptococcus pneumoniae NOT DETECTED NOT DETECTED   Streptococcus pyogenes NOT DETECTED NOT DETECTED   A.calcoaceticus-baumannii NOT DETECTED NOT DETECTED   Bacteroides fragilis NOT DETECTED NOT DETECTED   Enterobacterales DETECTED (A) NOT DETECTED   Enterobacter cloacae complex NOT DETECTED NOT DETECTED   Escherichia coli NOT DETECTED NOT DETECTED   Klebsiella aerogenes NOT DETECTED NOT DETECTED   Klebsiella oxytoca NOT DETECTED NOT DETECTED   Klebsiella pneumoniae NOT DETECTED NOT DETECTED   Proteus species DETECTED (A) NOT DETECTED   Salmonella species NOT DETECTED NOT DETECTED   Serratia marcescens NOT DETECTED NOT DETECTED   Haemophilus influenzae NOT DETECTED NOT DETECTED   Neisseria meningitidis NOT DETECTED NOT DETECTED   Pseudomonas aeruginosa NOT DETECTED NOT DETECTED   Stenotrophomonas maltophilia  NOT DETECTED NOT DETECTED   Candida albicans NOT DETECTED NOT DETECTED   Candida auris NOT DETECTED NOT DETECTED   Candida glabrata NOT DETECTED NOT DETECTED   Candida krusei NOT DETECTED NOT DETECTED   Candida parapsilosis NOT DETECTED NOT DETECTED   Candida tropicalis NOT DETECTED NOT DETECTED   Cryptococcus neoformans/gattii NOT DETECTED NOT DETECTED   CTX-M ESBL NOT DETECTED NOT DETECTED   Carbapenem resistance IMP NOT DETECTED NOT DETECTED   Carbapenem resistance KPC NOT DETECTED NOT DETECTED   Carbapenem resistance NDM NOT DETECTED NOT DETECTED   Carbapenem resist OXA 48 LIKE NOT DETECTED NOT DETECTED   Carbapenem resistance VIM NOT DETECTED NOT DETECTED    Dolly Rias RPh 05/08/2022, 10:07 PM

## 2022-05-08 NOTE — Progress Notes (Signed)
Initial Nutrition Assessment  DOCUMENTATION CODES:   Severe malnutrition in context of chronic illness  INTERVENTION:  - Diet as medically appropriate per MD.   - Boost Plus TID with meals once able to take PO, each supplement provides 250 kcal and 9 grams of protein.  - Diet education with handout provided to daughter for wound healing nutrition therapy.  - Multivitamin with minerals daily, 500mg  vitamin C x30 days, and 220mg  zinc x14 days to promote wound healing.   - Monitor weight trends closely.   NUTRITION DIAGNOSIS:   Severe Malnutrition related to chronic illness (metastatic breast cancer) as evidenced by severe fat depletion, severe muscle depletion.  GOAL:   Patient will meet greater than or equal to 90% of their needs  MONITOR:   PO intake, Supplement acceptance, Weight trends  REASON FOR ASSESSMENT:   Consult Wound healing  ASSESSMENT:   67 y.o. female with PMH functional quadriplegia since a fall in 2010, widely metastatic breast cancer not currently on chemotherapy who presented with complaints of AMS and difficulty swallowing.  Patient sleeping at time of visit, daughter at bedside and son on the phone.  They report patient's UBW to be 127-134#. Felt she may have lost a little weight in the past as a result of cancer but not recently.  There is no weight history available in EMR since 2020.  Per RN, patient weighed at 44.1kg (97#) this admission, weight has just not been entered yet. This could indicate significant weight loss however until to verify time frame at this time.   Children report their mom usually eats well at home.  Breakfast: Premier Protein, egg whites, yogurt, applesauce Lunch: Premier Protein, chicken noodle soup Dinner: Engineer, civil (consulting), chicken noodle soup  They report patient typically has a good appetite. Had started complaining of her throat hurting a couple weeks ago but was normal until Sunday evening into Monday morning  (3/31-4/1) when she started to refuse to eat and be confused.   Discussed that no Premier Protein in facility, daughter feels patient would be agreeable to Boost.  Per RN, patient has not been alert enough to have bedside swallow eval done yet. Will wait to order supplements for now.   Provided wound healing education and handout to patient's daughter. Discussed importance of protein rich food sources, adequate fluid intake, and fruits and vegetables for added vitamins and minerals once able to eat. Daughter appreciative of information.     Medications reviewed and include: Colace  Labs reviewed:  Na 129   NUTRITION - FOCUSED PHYSICAL EXAM:  Flowsheet Row Most Recent Value  Orbital Region Severe depletion  Upper Arm Region Severe depletion  Thoracic and Lumbar Region Unable to assess  Buccal Region Severe depletion  Temple Region Severe depletion  Clavicle Bone Region Severe depletion  Clavicle and Acromion Bone Region Unable to assess  Scapular Bone Region Unable to assess  Dorsal Hand Unable to assess  Patellar Region Moderate depletion  Anterior Thigh Region Moderate depletion  Posterior Calf Region Moderate depletion  Edema (RD Assessment) None  Hair Reviewed  Eyes Reviewed  Mouth Reviewed  Skin Reviewed  Nails Reviewed       Diet Order:   Diet Order             Diet regular Room service appropriate? Yes; Fluid consistency: Thin  Diet effective now                   EDUCATION NEEDS:  Education needs have been  addressed  Skin:  Skin Assessment: Skin Integrity Issues: Skin Integrity Issues:: Other (Comment), Stage II, Stage III, DTI, Unstageable DTI: L knee; lateral back Stage II: R coccyx; Mid back; R knee; Mid/Upper Vertebral column; L hip Stage III: lower medial coccyx; buttocks Unstageable: R back Other: L breast  Last BM:  unknown  Height:  Ht Readings from Last 1 Encounters:  05/07/22 5\' 6"  (1.676 m)   Weight:  Wt Readings from Last 1  Encounters:  02/17/18 63.5 kg  *Per RN, weight this admission at 44.1kg   BMI:  Body mass index is 15.70 kg/m. *using weight as reported by RN   Estimated Nutritional Needs:  Kcal:  1750-2000 kcals Protein:  80-90 grams Fluid:  >/= 1.7L    April Graves RD, LDN For contact information, refer to [facility].

## 2022-05-08 NOTE — Progress Notes (Signed)
[name] is an 67 y.o. female who presented to [facility] on 05/07/2022 with sepsis.  Patient with a sacral decubitus as well as fungating wound on left breast.  Assessment: BCID + Proteus (no ESBL) in 1 out of 4 bottles; urine culture pending; MRSA PCR +  Name of physician (or Provider) Contacted: Dr Hollice Gong  Current antibiotics: Vancomycin and Cefepime  Changes to prescribed antibiotics recommended:  Continue current antibiotics at this time  Results for orders placed or performed during the hospital encounter of 05/07/22  Blood Culture ID Panel (Reflexed) (Collected: 05/07/2022 10:41 PM)  Result Value Ref Range   Enterococcus faecalis NOT DETECTED NOT DETECTED   Enterococcus Faecium NOT DETECTED NOT DETECTED   Listeria monocytogenes NOT DETECTED NOT DETECTED   Staphylococcus species NOT DETECTED NOT DETECTED   Staphylococcus aureus (BCID) NOT DETECTED NOT DETECTED   Staphylococcus epidermidis NOT DETECTED NOT DETECTED   Staphylococcus lugdunensis NOT DETECTED NOT DETECTED   Streptococcus species NOT DETECTED NOT DETECTED   Streptococcus agalactiae NOT DETECTED NOT DETECTED   Streptococcus pneumoniae NOT DETECTED NOT DETECTED   Streptococcus pyogenes NOT DETECTED NOT DETECTED   A.calcoaceticus-baumannii NOT DETECTED NOT DETECTED   Bacteroides fragilis NOT DETECTED NOT DETECTED   Enterobacterales DETECTED (A) NOT DETECTED   Enterobacter cloacae complex NOT DETECTED NOT DETECTED   Escherichia coli NOT DETECTED NOT DETECTED   Klebsiella aerogenes NOT DETECTED NOT DETECTED   Klebsiella oxytoca NOT DETECTED NOT DETECTED   Klebsiella pneumoniae NOT DETECTED NOT DETECTED   Proteus species DETECTED (A) NOT DETECTED   Salmonella species NOT DETECTED NOT DETECTED   Serratia marcescens NOT DETECTED NOT DETECTED   Haemophilus influenzae NOT DETECTED NOT DETECTED   Neisseria meningitidis  NOT DETECTED NOT DETECTED   Pseudomonas aeruginosa NOT DETECTED NOT DETECTED   Stenotrophomonas maltophilia NOT DETECTED NOT DETECTED   Candida albicans NOT DETECTED NOT DETECTED   Candida auris NOT DETECTED NOT DETECTED   Candida glabrata NOT DETECTED NOT DETECTED   Candida krusei NOT DETECTED NOT DETECTED   Candida parapsilosis NOT DETECTED NOT DETECTED   Candida tropicalis NOT DETECTED NOT DETECTED   Cryptococcus neoformans/gattii NOT DETECTED NOT DETECTED   CTX-M ESBL NOT DETECTED NOT DETECTED   Carbapenem resistance IMP NOT DETECTED NOT DETECTED   Carbapenem resistance KPC NOT DETECTED NOT DETECTED   Carbapenem resistance NDM NOT DETECTED NOT DETECTED   Carbapenem resist OXA 48 LIKE NOT DETECTED NOT DETECTED   Carbapenem resistance VIM NOT DETECTED NOT DETECTED    Everette Rank, PharmD 05/08/2022  6:26 PM

## 2022-05-08 NOTE — Progress Notes (Signed)
Pharmacy Antibiotic Note  April Graves is a 67 y.o. female admitted on 05/07/2022 with sepsis.  Pt has sacral decub as well as fungating wound on left breast. Pharmacy has been consulted for vancomycin and cefepime dosing.  Today, 05/08/22 TBW reported as 44 kg. Use TBW for dosing as it is less than IBW SCr 1.08, CrCl ~36 mL/min  Plan: Cefepime 2 g IV q12h Vancomycin 1000 mg IV q48h for estimated AUC 460. Goal AUC 400-550.  Monitor culture data, renal function. Check vancomycin levels at steady state as needed.  Height: 5\' 6"  (167.6 cm) IBW/kg (Calculated) : 59.3  Temp (24hrs), Avg:96.8 F (36 C), Min:94.9 F (34.9 C), Max:98.8 F (37.1 C)  Recent Labs  Lab 05/07/22 2241 05/08/22 0050 05/08/22 0515 05/08/22 0748  WBC 32.1*  --   --   --   CREATININE 1.08*  --   --   --   LATICACIDVEN 3.2* 3.5* 2.3* 2.6*    CrCl cannot be calculated (Unknown ideal weight.).    Allergies  Allergen Reactions   Codeine Anxiety and Other (See Comments)   Lisinopril Anxiety and Other (See Comments)    Heart Races    Pork-Derived Products     Patient reports she does not eat pork     Antimicrobials this admission: cefepime 4/1 >>  vancomycin 4/1 >>   Dose adjustments this admission:  Microbiology results: 4/1 BCx: ngtd  Lenis Noon, PharmD 05/08/2022 11:17 AM

## 2022-05-08 NOTE — ED Notes (Signed)
ED TO INPATIENT HANDOFF REPORT  Name/Age/Gender April Graves 67 y.o. 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 [phone]  Admitting Physician: Lucillie Garfinkel [id number]  Attending Physician: Hollice Gong, Curlene Labrum [id number]  Certification:: I certify this patient will need inpatient services for at least 2 midnights  Estimated Length of Stay: 2          Medical History Past Medical History:  Diagnosis Date   Anemia of chronic disease    Chronic pain syndrome    Functional quadriplegia    noted in d/c summary 03/2015   History of decubitus ulcer    infected, left glut/hip   History of malnutrition    Osteoarthritis of both shoulders    Poor  dentition    Secondary osteoarthritis of left ankle    Post-traumatic; at the tibiotalar joint.  + severe disuse osteopenia.    Allergies Allergies  Allergen Reactions   Codeine Anxiety and Other (See Comments)   Lisinopril Anxiety and Other (See Comments)    Heart Races    Pork-Derived Products     Patient reports she does not eat pork     IV Location/Drains/Wounds Patient Lines/Drains/Airways Status     Active Line/Drains/Airways     Name Placement date Placement time Site Days   Peripheral IV 05/07/22 20 G 1.88" Anterior;Right;Upper Arm 05/07/22  2241  Arm  1   Urethral Catheter carter, shantela Latex 14 Fr. 05/08/22  0300  Latex  less than 1   Pressure Injury 05/07/22 Hip Left Stage 2 -  Partial thickness loss of dermis presenting as a shallow open injury with a red, pink wound bed without slough. stage IV with tunnelling left hip 05/07/22  2230  -- 1   Pressure Ulcer 03/07/15 Stage III -  Full thickness tissue loss. Subcutaneous fat may be visible but bone, tendon or muscle are NOT exposed. 03/07/15  1845  -- 2619   Pressure Injury 05/07/22 Coccyx Right Stage 2 -  Partial thickness loss of dermis presenting as a shallow open injury with a red, pink wound bed without slough. 05/07/22  2230  -- 1   Pressure Injury 05/07/22 Coccyx Lower;Medial;Mid Stage 3 -  Full thickness tissue loss.  Subcutaneous fat may be visible but bone, tendon or muscle are NOT exposed. 05/07/22  2230  -- 1   Pressure Injury 05/07/22 Back Mid Stage 2 -  Partial thickness loss of dermis presenting as a shallow open injury with a red, pink wound bed without slough. 05/07/22  2230  -- 1   Wound / Incision (Open or Dehisced) 03/08/15 Other (Comment) Hip Left 03/08/15  1520  Hip  2618   Wound / Incision (Open or Dehisced) 05/07/22 Non-pressure wound Breast Left 05/07/22  2230  Breast  1            Labs/Imaging Results for orders placed or performed during the hospital encounter of 05/07/22 (from the past 48  hour(s))  Resp panel by RT-PCR (RSV, Flu A&B, Covid) Anterior Nasal Swab     Status: None   Collection Time: 05/07/22 12:50 AM   Specimen: Anterior Nasal Swab  Result Value Ref Range   SARS Coronavirus 2 by RT PCR NEGATIVE NEGATIVE    Comment: (NOTE) SARS-CoV-2 target nucleic acids are NOT DETECTED.  The SARS-CoV-2 RNA is generally detectable in upper respiratory specimens during the acute phase of infection. The lowest concentration of SARS-CoV-2 viral copies this assay can detect is 138 copies/mL. A negative result does not preclude SARS-Cov-2 infection and should not be used as the sole basis for treatment or other patient management decisions. A negative result may occur with  improper specimen collection/handling, submission of specimen other than nasopharyngeal swab, presence of viral mutation(s) within the areas targeted by this assay, and inadequate number of viral copies(<138 copies/mL). A negative result must be combined with clinical observations, patient history, and epidemiological information. The expected result is Negative.  Fact Sheet for Patients:  EntrepreneurPulse.com.au  Fact Sheet for Healthcare Providers:  IncredibleEmployment.be  This test is no t yet approved or cleared by the [place] FDA and  has been authorized for detection and/or diagnosis of SARS-CoV-2 by FDA under an Emergency Use Authorization (EUA). This EUA will remain  in effect (meaning this test can be used) for the duration of the COVID-19 declaration under Section 564(b)(1) of the Act, 21 U.S.C.section 360bbb-3(b)(1), unless the authorization is terminated  or revoked sooner.       Influenza A by PCR NEGATIVE NEGATIVE   Influenza B by PCR NEGATIVE NEGATIVE    Comment: (NOTE) The Xpert Xpress SARS-CoV-2/FLU/RSV plus assay is intended as an aid in the diagnosis of influenza from Nasopharyngeal swab specimens and should not be used as a sole basis  for treatment. Nasal washings and aspirates are unacceptable for Xpert Xpress SARS-CoV-2/FLU/RSV testing.  Fact Sheet for Patients: EntrepreneurPulse.com.au  Fact Sheet for Healthcare Providers: IncredibleEmployment.be  This test is not yet approved or cleared by the [place] FDA and has been authorized for detection and/or diagnosis of SARS-CoV-2 by FDA under an Emergency Use Authorization (EUA). This EUA will remain in effect (meaning this test can be used) for the duration of the COVID-19 declaration under Section 564(b)(1) of the Act, 21 U.S.C. section 360bbb-3(b)(1), unless the authorization is terminated or revoked.     Resp Syncytial Virus by PCR NEGATIVE NEGATIVE    Comment: (NOTE) Fact Sheet for Patients: EntrepreneurPulse.com.au  Fact Sheet for Healthcare Providers: IncredibleEmployment.be  This test is not yet approved or cleared by the [place] FDA and has been authorized for detection and/or diagnosis of SARS-CoV-2 by FDA under an Emergency Use Authorization (EUA). This EUA will remain in effect (meaning this test can be used)  for the duration of the COVID-19 declaration under Section 564(b)(1) of the Act, 21 U.S.C. section 360bbb-3(b)(1), unless the authorization is terminated or revoked.  Performed at [facility], [place] [address] [place], [place] [address]   Lactic acid, plasma     Status: Abnormal   Collection Time: 05/07/22 10:41 PM  Result Value Ref Range   Lactic Acid, Venous 3.2 (HH) 0.5 - 1.9 mmol/L    Comment: CRITICAL RESULT CALLED TO, READ BACK BY AND VERIFIED WITH Harvel Quale, RN 05/07/22 2329 J. COLE Performed at [facility], [place] [address], [place], [place] [address]   Comprehensive metabolic panel     Status: Abnormal   Collection Time: 05/07/22 10:41 PM  Result Value Ref Range   Sodium 128 (L) 135 - 145 mmol/L   Potassium 5.5  (H) 3.5 - 5.1 mmol/L   Chloride 97 (L) 98 - 111 mmol/L   CO2 17 (L) 22 - 32 mmol/L   Glucose, Bld 135 (H) 70 - 99 mg/dL    Comment: Glucose reference range applies only to samples taken after fasting for at least 8 hours.   BUN 73 (H) 8 - 23 mg/dL   Creatinine, Ser 1.08 (H) 0.44 - 1.00 mg/dL   Calcium 9.4 8.9 - 10.3 mg/dL   Total Protein 7.2 6.5 - 8.1 g/dL   Albumin 2.4 (L) 3.5 - 5.0 g/dL   AST 78 (H) 15 - 41 U/L   ALT 13 0 - 44 U/L   Alkaline Phosphatase 216 (H) 38 - 126 U/L   Total Bilirubin 1.3 (H) 0.3 - 1.2 mg/dL   GFR, Estimated 57 (L) >60 mL/min    Comment: (NOTE) Calculated using the CKD-EPI Creatinine Equation (2021)    Anion gap 14 5 - 15    Comment: Performed at [facility], [place] [address] [place], [place] [address]  CBC with Differential     Status: Abnormal   Collection Time: 05/07/22 10:41 PM  Result Value Ref Range   WBC 32.1 (H) 4.0 - 10.5 K/uL    Comment: WHITE COUNT CONFIRMED ON SMEAR   RBC 4.88 3.87 - 5.11 MIL/uL   Hemoglobin 12.1 12.0 - 15.0 g/dL   HCT 39.3 36.0 - 46.0 %   MCV 80.5 80.0 - 100.0 fL   MCH 24.8 (L) 26.0 - 34.0 pg   MCHC 30.8 30.0 - 36.0 g/dL   RDW 18.5 (H) 11.5 - 15.5 %   Platelets 99 (L) 150 - 400 K/uL    Comment: SPECIMEN CHECKED FOR CLOTS Immature Platelet Fraction may be clinically indicated, consider ordering this additional test [id number] REPEATED TO VERIFY PLATELET COUNT CONFIRMED BY SMEAR    nRBC 0.1 0.0 - 0.2 %   Neutrophils Relative % 87 %   Neutro Abs 28.4 (H) 1.7 - 7.7 K/uL   Lymphocytes Relative 6 %   Lymphs Abs 1.8 0.7 - 4.0 K/uL   Monocytes Relative 2 %   Monocytes Absolute 0.6 0.1 - 1.0 K/uL   Eosinophils Relative 0 %   Eosinophils Absolute 0.0 0.0 - 0.5 K/uL   Basophils Relative 1 %   Basophils Absolute 0.2 (H) 0.0 - 0.1 K/uL   WBC Morphology MILD LEFT SHIFT (1-5% METAS, OCC MYELO, OCC BANDS)     Comment: TOXIC GRANULATION VACUOLATED NEUTROPHILS    Immature Granulocytes 4 %   Abs Immature  Granulocytes 1.11 (H) 0.00 - 0.07 K/uL   Polychromasia PRESENT     Comment: Performed at [facility], [place] [address] [place], [place] [address]  Protime-INR     Status: Abnormal   Collection Time: 05/07/22 10:41 PM  Result Value Ref Range   Prothrombin Time 17.3 (H) 11.4 - 15.2 seconds   INR 1.4 (H) 0.8 - 1.2    Comment: (NOTE) INR goal varies based on device and disease states. Performed at [facility], [place] [address] [place], [place] [address]   APTT     Status: None   Collection Time: 05/07/22 10:41 PM  Result Value Ref Range   aPTT 27 24 - 36 seconds    Comment: Performed at [facility], [place] [address], [place], [place] [address]  Lactic acid, plasma     Status: Abnormal   Collection Time: 05/08/22 12:50 AM  Result Value Ref Range   Lactic Acid, Venous 3.5 (HH) 0.5 - 1.9 mmol/L    Comment: CRITICAL RESULT CALLED TO, READ BACK BY AND VERIFIED WITH Inge Rise, RN 05/08/22 0126 BY K. DAVIS Performed at [place] [address] [place], [place] [address]   Blood gas, venous ([facility], [facility], [facility])     Status: Abnormal   Collection Time: 05/08/22 12:50 AM  Result Value Ref Range   pH, Ven 7.3 7.25 - 7.43   pCO2, Ven 34 (L) 44 - 60 mmHg   pO2, Ven 53 (H) 32 - 45 mmHg   Bicarbonate 16.7 (L) 20.0 - 28.0 mmol/L   Acid-base deficit 8.8 (H) 0.0 - 2.0 mmol/L   O2 Saturation 84.2 %   Patient temperature 37.0     Comment: Performed at [facility], [place] [address], [place], [place] [address]  Lactic acid, plasma     Status: Abnormal   Collection Time: 05/08/22  5:15 AM  Result Value Ref Range   Lactic Acid, Venous 2.3 (HH) 0.5 - 1.9 mmol/L    Comment: CRITICAL VALUE NOTED. VALUE IS CONSISTENT WITH PREVIOUSLY REPORTED/CALLED VALUE Performed at [place] [address], [place], [place] [address]   Urinalysis, w/ Reflex to Culture (Infection Suspected) -Urine, Clean  Catch     Status: Abnormal   Collection Time: 05/08/22  5:24 AM  Result Value Ref Range   Specimen Source URINE, CLEAN CATCH    Color, Urine AMBER (A) YELLOW    Comment: BIOCHEMICALS MAY BE AFFECTED BY COLOR   APPearance CLOUDY (A) CLEAR   Specific Gravity, Urine 1.040 (H) 1.005 - 1.030   pH 5.0 5.0 - 8.0   Glucose, UA NEGATIVE NEGATIVE mg/dL   Hgb urine dipstick MODERATE (A) NEGATIVE   Bilirubin Urine NEGATIVE NEGATIVE   Ketones, ur NEGATIVE NEGATIVE mg/dL   Protein, ur 30 (A) NEGATIVE mg/dL   Nitrite NEGATIVE NEGATIVE   Leukocytes,Ua MODERATE (A) NEGATIVE   RBC / HPF 21-50 0 - 5 RBC/hpf   WBC, UA >50 0 - 5 WBC/hpf    Comment:        Reflex urine culture not performed if WBC <=10, OR if Squamous epithelial cells >5. If Squamous epithelial cells >5 suggest recollection.    Bacteria, UA RARE (A) NONE SEEN   Squamous Epithelial / HPF 0-5 0 - 5 /HPF   WBC Clumps PRESENT    Mucus PRESENT    Hyaline Casts, UA PRESENT    Ca Oxalate Crys, UA PRESENT     Comment: Performed at [facility], [place] [address] [place], [place] [address]  Lactic acid, plasma  Status: Abnormal   Collection Time: 05/08/22  7:48 AM  Result Value Ref Range   Lactic Acid, Venous 2.6 (HH) 0.5 - 1.9 mmol/L    Comment: CRITICAL VALUE NOTED. VALUE IS CONSISTENT WITH PREVIOUSLY REPORTED/CALLED VALUE Performed at [place] [address] [place], [place] [address]    CT Head Wo Contrast  Result Date: 05/08/2022 CLINICAL DATA:  Altered mental status EXAM: CT HEAD WITHOUT CONTRAST TECHNIQUE: Contiguous axial images were obtained from the base of the skull through the vertex without intravenous contrast. RADIATION DOSE REDUCTION: This exam was performed according to the departmental dose-optimization program which includes automated exposure control, adjustment of the mA and/or kV according to patient size and/or use of iterative reconstruction technique. COMPARISON:   07/02/2021 CTA head FINDINGS: Brain: Hyperdense lesion in the right frontal lobe, which is not well defined but measures approximately 2.1 x 2.2 x 1.4 cm (AP x TR x CC) (series 5, image 21 and series 4, image 19), which is new from the prior exam. Suspect a second hyperdense lesion in the right temporal lobe, measuring approximately 1.2 x 1.6 x 1.2 cm (series 5, image 11 and series 4, image 31), which is also associated with edema. No acute infarct, hemorrhage, or midline shift. No hydrocephalus or extra-axial collection Vascular: No hyperdense vessel. Atherosclerotic calcifications in the intracranial carotid and vertebral arteries. Skull: Diffusely abnormal calvarium, with a large lytic area with associated soft tissue mass at the parietal vertex, which measures approximately 4.0 x 3.8 x 1.3 cm (series 5, image 26 and series 4, image 44), previously 2.6 x 2.3 x 0.8 cm. The lytic lesion now extends through the full thickness of the calvarium Sinuses/Orbits: Clear paranasal sinuses. Other: No acute finding in the orbits. IMPRESSION: 1. New hyperdense lesions in the right frontal and temporal lobes, concerning for metastatic disease. MRI with and without contrast is recommended for further evaluation. 2. Interval increase in size of a large lytic lesion with associated soft tissue mass at the parietal vertex, which now extends through the full thickness of the calvarium. 3. No acute infarct or hemorrhage. No midline shift. These results were called by telephone at the time of interpretation on 05/08/2022 at 3:15 am to provider [facility] , who verbally acknowledged these results. Electronically Signed   By: Merilyn Baba M.D.   On: 05/08/2022 03:16   CT CHEST ABDOMEN PELVIS W CONTRAST  Result Date: 05/08/2022 CLINICAL DATA:  Sepsis, left breast wound with maggots, history of left breast cancer, leukocytosis EXAM: CT CHEST, ABDOMEN, AND PELVIS WITH CONTRAST TECHNIQUE: Multidetector CT imaging of the chest, abdomen  and pelvis was performed following the standard protocol during bolus administration of intravenous contrast. RADIATION DOSE REDUCTION: This exam was performed according to the departmental dose-optimization program which includes automated exposure control, adjustment of the mA and/or kV according to patient size and/or use of iterative reconstruction technique. CONTRAST:  72mL OMNIPAQUE IOHEXOL 300 MG/ML  SOLN COMPARISON:  CT chest 07/01/2021 FINDINGS: CT CHEST FINDINGS Cardiovascular: Mild coronary artery calcification. Global cardiac size within normal limits. No pericardial effusion. Central pulmonary arteries are of normal caliber. Thoracic aorta is unremarkable. Mediastinum/Nodes: Visualized thyroid is unremarkable. There has developed extensive pathologic mediastinal, bilateral hilar, and left axillary and subpectoral adenopathy in keeping with widespread thoracic nodal metastases. Pathologic adenopathy within the left hilum results in significant mass effect upon the mid esophagus. The esophagus is otherwise unremarkable. Lungs/Pleura: There are innumerable bilateral pulmonary nodules in keeping with widespread pulmonary metastases with  irregular septal thickening noted within the lung apices suspicious for superimposed lymphangitic spread of malignancy. Multiple pleural soft tissue nodules are seen bilaterally in keeping with pleural metastatic disease. No pneumothorax or pleural effusion. There is superimposed nodular peribronchial airspace infiltrate within the right upper lobe which is associated with asymmetric bronchial wall thickening in may represent a superimposed right upper lobe bronchopneumonia. No central obstructing lesion. Musculoskeletal: There are sclerotic lesions involving the mid and distal right clavicle, left sixth rib, and the T3 and T8 vertebral bodies in keeping with osseous metastatic disease. Additionally, there is a destructive soft tissue mass which erodes the body of the  sternum in keeping with a lytic osseous metastasis. This appears confluent with the dominant mass within the left breast and extends into the anterior mediastinum to abut the anterior pericardium. The previously noted left breast mass has markedly enlarged, now essentially replacing the left breast tissue, abutting the left chest wall and extending into the subpleural soft tissues and eroding through the dermis with extensive superficial ulceration. Heterogeneous enhancement of the mass suggests widespread areas of mucinous or necrotic degeneration. CT ABDOMEN PELVIS FINDINGS Hepatobiliary: No focal liver abnormality is seen. No gallstones, gallbladder wall thickening, or biliary dilatation. Pancreas: Unremarkable Spleen: Evaluation of the spleen is limited by motion artifact, however, a wedge-shaped defect within the upper pole the spleen is in keeping with a acute to subacute splenic infarct. Adrenals/Urinary Tract: The adrenal glands are unremarkable. The kidneys are normal in size and position. Multiple wedge-shaped cortical defects are seen within the kidneys bilaterally, best appreciated within the upper pole of the right kidney and posterior interpolar region of the left kidney in keeping with multiple renal infarcts. 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 Multiple nonobstructing calculi are seen within the right kidney measuring up to 10 mm in greatest dimension. No additional renal or ureteral calculi. No hydronephrosis. The bladder is unremarkable. Stomach/Bowel: Large stool ball within the rectal vault suggests fecal impaction. Moderate stool burden. The stomach, small bowel, and large bowel are otherwise unremarkable. No free intraperitoneal gas or fluid. Vascular/Lymphatic: Aortic atherosclerosis. No enlarged abdominal or pelvic lymph nodes. Reproductive: Multiple calcified involuted uterine fibroids are seen within the  uterus. No adnexal masses. Other: No abdominal wall hernia. Musculoskeletal: Large sacral decubitus ulcer is present with subcutaneous foci of gas both within the a soft tissues dorsal to the sacrum as well as within the presacral space. There is marked attenuation of the overlying soft tissue and probable exposure of the sacrum with extensive lytic destruction of the mid and distal sacrum. 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 Advanced degenerative arthritis is noted of the hips bilaterally with marked remodeling of the humeral heads. Sclerotic metastases are seen within the L1, L2 and possibly the L4 vertebral bodies., IMPRESSION: 1. Marked interval enlargement of the previously noted left breast mass, now essentially replacing the left breast tissue, abutting the left chest wall and extending into the subpleural soft tissues and eroding through the dermis with extensive superficial ulceration. Heterogeneous enhancement of the mass suggests widespread areas of mucinous or necrotic degeneration. 2. Interval development of extensive pathologic mediastinal, bilateral hilar, and left axillary and subpectoral adenopathy in keeping with widespread thoracic nodal metastases. Pathologic adenopathy within the left hilum results in significant mass effect upon  the mid esophagus. 3. Innumerable bilateral pulmonary nodules in keeping with widespread pulmonary metastases. Irregular septal thickening within the lung apices suspicious for superimposed lymphangitic spread of malignancy. 4. Superimposed nodular peribronchial airspace infiltrate within the right upper lobe which is associated with asymmetric bronchial wall thickening in may represent a superimposed right upper lobe bronchopneumonia. 5. Multiple wedge-shaped cortical defects within the  kidneys bilaterally in keeping with multiple renal infarcts. 6. Acute to subacute splenic infarct. 7. Large sacral decubitus ulcer with marked attenuation of the overlying soft tissue and probable exposure of the sacrum with extensive lytic destruction of the mid and distal sacrum. 8. 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 Aortic Atherosclerosis (ICD10-I70.0). Electronically Signed   By: Fidela Salisbury M.D.   On: 05/08/2022 00:54   DG Chest Port 1 View  Result Date: 05/07/2022 CLINICAL DATA:  Altered EXAM: PORTABLE CHEST 1 VIEW COMPARISON:  07/01/2021 FINDINGS: Asymmetric soft tissue density over left chest likely corresponding to history of breast mass. New heterogeneous bilateral airspace opacities. No pleural effusion or pneumothorax. Normal cardiac size. Faint bilateral nodular lung opacities are concerning for metastatic disease. IMPRESSION: 1. Faint bilateral nodular lung opacities are concerning for metastatic disease. Diffuse heterogeneous interstitial and ground-glass opacities either due to pneumonia or metastatic disease. 2. Asymmetric soft tissue density over the left chest likely corresponding to history of breast mass. Electronically Signed   By: Donavan Foil M.D.   On: 05/07/2022 23:32    Pending Labs Unresulted Labs (From admission, onward)     Start     Ordered   05/09/22 0500  CBC  Daily,   R      05/08/22 0752   05/09/22 [id number]  Basic metabolic panel  Daily,   R      05/08/22 0752   05/08/22 0753  CBC  Once,   STAT        05/08/22 0752   05/08/22 0000000  Basic metabolic panel  Once,   STAT        05/08/22 0752   05/08/22 0524  Urine Culture  Once,   R        05/08/22 0524   05/07/22 2234  Blood Culture (routine x 2)  (Septic presentation on arrival (screening labs, nursing and treatment orders for obvious  sepsis))  BLOOD CULTURE X 2,   STAT      05/07/22 2237            Vitals/Pain Today's Vitals   05/08/22 0615 05/08/22 0645 05/08/22 0745 05/08/22 0800  BP: (!) 119/90 93/73 100/71 99/71  Pulse: (!) 123 (!) 128 (!) 131 (!) 127  Resp:  19  20  Temp:  98.8 F (37.1 C)    TempSrc:  Rectal    SpO2: 90% 91% 93% 92%  Height:        Isolation Precautions No active isolations  Medications Medications  lactated ringers infusion ( Intravenous New Bag/Given 05/08/22 0125)  lactated ringers bolus 1,000 mL (0 mLs Intravenous Stopped 05/07/22 2350)    And  lactated ringers bolus 500 mL (0 mLs Intravenous Stopped 05/08/22 0243)    And  lactated ringers bolus 250 mL (0 mLs Intravenous Stopped 05/08/22 0642)  vancomycin (VANCOCIN) IVPB 1000 mg/200 mL premix (0 mg Intravenous Stopped 05/08/22 0019)  ceFEPIme (MAXIPIME) 2 g in sodium chloride 0.9 % 100 mL IVPB (0 g  Intravenous Stopped 05/07/22 2328)  sodium chloride (PF) 0.9 % injection (  Given by Other 05/08/22 0341)  iohexol (OMNIPAQUE) 300 MG/ML solution 75 mL (75 mLs Intravenous Contrast Given 05/08/22 0015)  sodium chloride 0.9 % bolus 1,000 mL (0 mLs Intravenous Stopped 05/08/22 0642)    Mobility non-ambulatory

## 2022-05-09 DIAGNOSIS — R6520 Severe sepsis without septic shock: Secondary | ICD-10-CM | POA: Diagnosis not present

## 2022-05-09 DIAGNOSIS — E43 Unspecified severe protein-calorie malnutrition: Secondary | ICD-10-CM | POA: Insufficient documentation

## 2022-05-09 DIAGNOSIS — A419 Sepsis, unspecified organism: Secondary | ICD-10-CM | POA: Diagnosis not present

## 2022-05-09 LAB — BASIC METABOLIC PANEL
Anion gap: 9 (ref 5–15)
BUN: 62 mg/dL — ABNORMAL HIGH (ref 8–23)
CO2: 17 mmol/L — ABNORMAL LOW (ref 22–32)
Calcium: 8.8 mg/dL — ABNORMAL LOW (ref 8.9–10.3)
Chloride: 107 mmol/L (ref 98–111)
Creatinine, Ser: 0.68 mg/dL (ref 0.44–1.00)
GFR, Estimated: >60 mL/min (ref >60)
Glucose, Bld: 113 mg/dL — ABNORMAL HIGH (ref 70–99)
Potassium: 4.9 mmol/L (ref 3.5–5.1)
Sodium: 133 mmol/L — ABNORMAL LOW (ref 135–145)

## 2022-05-09 LAB — CBC
HCT: 28.1 % — ABNORMAL LOW (ref 36.0–46.0)
Hemoglobin: 8.7 g/dL — ABNORMAL LOW (ref 12.0–15.0)
MCH: 25.7 pg — ABNORMAL LOW (ref 26.0–34.0)
MCHC: 31 g/dL (ref 30.0–36.0)
MCV: 82.9 fL (ref 80.0–100.0)
Platelets: 47 10*3/uL — ABNORMAL LOW (ref 150–400)
RBC: 3.39 MIL/uL — ABNORMAL LOW (ref 3.87–5.11)
RDW: 18.5 % — ABNORMAL HIGH (ref 11.5–15.5)
WBC: 41.6 10*3/uL — ABNORMAL HIGH (ref 4.0–10.5)
nRBC: 0 % (ref 0.0–0.2)

## 2022-05-09 LAB — URINE CULTURE: Culture: NO GROWTH

## 2022-05-09 MED ORDER — SODIUM CHLORIDE 0.9 % IV BOLUS: 1000.0000 mL | Freq: Once | INTRAVENOUS | Status: DC

## 2022-05-09 MED ORDER — VANCOMYCIN HCL IN DEXTROSE 1-5 GM/200ML-% IV SOLN: 1000.0000 mg | INTRAVENOUS | Status: DC

## 2022-05-09 MED ORDER — LORAZEPAM 2 MG/ML IJ SOLN: 1.0000 mg | INTRAMUSCULAR | Status: DC | PRN

## 2022-05-09 MED ORDER — LACTATED RINGERS IV BOLUS
500.0000 mL | Freq: Once | INTRAVENOUS | Status: AC
  Administered 2022-05-09: 500 mL via INTRAVENOUS

## 2022-05-09 MED ORDER — LACTATED RINGERS IV BOLUS: 500.0000 mL | Freq: Once | INTRAVENOUS | Status: DC

## 2022-05-09 MED ORDER — SODIUM CHLORIDE 0.9 % IV SOLN
3.0000 g | Freq: Four times a day (QID) | INTRAVENOUS | Status: DC
  Administered 2022-05-09: 3 g via INTRAVENOUS
  Filled 2022-05-09: qty 8

## 2022-05-09 MED ORDER — ORAL CARE MOUTH RINSE: 15.0000 mL | OROMUCOSAL | Status: DC | PRN

## 2022-05-09 MED ORDER — LORAZEPAM 1 MG PO TABS: 1.0000 mg | ORAL_TABLET | ORAL | Status: DC | PRN

## 2022-05-09 MED ORDER — LORAZEPAM 2 MG/ML PO CONC: 1.0000 mg | ORAL | Status: DC | PRN

## 2022-05-09 NOTE — TOC Initial Note (Signed)
Transition of Care [facility]) - Initial/Assessment Note    Patient Details  Name: April Graves MRN: [id number] Date of Birth: 29-Jun-1955  Transition of [facility]) CM/SW Contact:    Roseanne Kaufman, RN Phone Number: 05/09/2022, 5:31 PM  Clinical Narrative:                 Jackquline Berlin consult for ethical/ neglect concerns based on EMS report when at patient's home. This RNCM spoke with patient's family at bedside (son, daughter and niece) who report in 2013 the patient had a fall which she fractured her hip and leg, resulting in patient's decline in ambulation/ disability. Patient's son reports after patient's fall in 2013 she moved downstairs in her 2 story home, family made the home handicap accessible for patient to reside. Patient's daughter lives with the patient and son lives a couple of blocks away. Patient's son and daughter report they both take turns taking care of their mother.  Patient's son reports patient has a PCP that she hasn't seen in over 1 year (Dr. Aura Dials in [place], [place]) and had care at [facility]. Patient would go to urgent care if she was not feeling well. Patient's family provided transportation to doctor's appointment or patient would use transportation via her health insurance. Patient's son reports the patient has declined all chemo treatment related to breast cancer. Patient's son reports in 2023 patient started having intermittent decline in cognition and was told by doctors that it was signs of dementia. At baseline patient enjoyed crossword puzzles and Jeopardy. Patient's family became concerned when her cognition started to change again resulting in them calling EMS. Patient's son states he was told his mother has an aggressive cancer which has progressed very quickly. Patient's son reports he has honored his mother's wishes to include no treatment for breast cancer.  TOC will continue to follow for discharge needs.        Barriers to Discharge: Continued  Medical Work up   Patient Goals and CMS Choice Patient states their goals for this hospitalization and ongoing recovery are:: return home CMS Medicare.gov Compare Post Acute Care list provided to:: Patient Represenative (must comment) Charlann Lange (son) and Thomasena Edis (daughter)) Choice offered to / list presented to : Adult [place] ownership interest in [facility].provided to:: Adult Children    Expected Discharge Plan and Services In-house Referral: Chaplain Discharge Planning Services: CM Consult   Living arrangements for the past 2 months: Single Family Home                 DME Arranged: N/A         HH Arranged: NA          Prior Living Arrangements/Services Living arrangements for the past 2 months: Single Family Home Lives with:: Adult Children Patient language and need for interpreter reviewed:: Yes Do you feel safe going back to the place where you live?: Yes      Need for Family Participation in Patient Care: Yes (Comment) Care giver support system in place?: Yes (comment) Current home services: DME (electric wheelchair, hoyer lift, handicap accessible bathroom, hospital bed) Criminal Activity/Legal Involvement Pertinent to Current Situation/Hospitalization: No - Comment as needed  Activities of Daily Living      Permission Sought/Granted Permission sought to share information with : Case Manager Permission granted to share information with : Yes, Verbal Permission Granted  Share Information with NAME: Case Manager  Emotional Assessment Appearance:: Appears older than stated age Attitude/Demeanor/Rapport: Unable to Assess Affect (typically observed): Unable to Assess Orientation: : Oriented to Self Alcohol / Substance Use: Not Applicable Psych Involvement: No (comment)  Admission diagnosis:  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 [C50.912, C79.31] Sepsis with encephalopathy without septic shock, due to unspecified organism  [A41.9, R65.20, G93.41] Patient Active Problem List   Diagnosis Date Noted   Protein-calorie malnutrition, severe 05/09/2022   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 05/08/2022   Severe sepsis 05/08/2022   Lactic acidosis 05/08/2022   Goals of care, counseling/discussion 05/08/2022   Leukocytosis 05/08/2022   Thrombocytopenia 05/08/2022   Hyperkalemia 05/08/2022   Malignant neoplasm of female breast 11/24/2021   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 07/02/2021   AMS (altered mental status) 07/01/2021   SIRS (systemic inflammatory response syndrome) 07/01/2021   Left breast mass 07/01/2021   History of pulmonary embolism 07/01/2021   Pulmonary embolism 03/01/2017   Sinus tachycardia 03/01/2017   Hyponatremia 03/01/2017   Decubitus ulcer of thigh, stage 4 03/08/2015   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/07/2015   Anemia of chronic disease 03/07/2015   Hypokalemia 10/15/2011   PCP:  Pcp, No Pharmacy:   [facility] [address], [place] - [address] [address] Phone: [phone] Fax: [phone]  [id number] - [place] [id number] [place] [id number] Phone: [phone] Fax: [phone]  [facility] [place], [place] [address] [address] Phone: [phone] Fax: [phone]     Social Determinants of Health (SDOH) Social History: SDOH Screenings   Depression (PHQ2-9): Low Risk  (03/06/2018)  Tobacco Use: Low Risk  (05/07/2022)   SDOH Interventions:     Readmission Risk Interventions    05/09/2022    4:56 PM  Readmission Risk Prevention Plan  Transportation Screening Complete  HRI or Home Care Consult Complete  Social Work Consult for Severance Planning/Counseling Complete  Medication Review Press photographer) Complete

## 2022-05-09 NOTE — Progress Notes (Signed)
Chaplain engaged in an initial visit with Leydy, her son, daughter, and daughter-in-law. Chaplain provided space for narrative life review. Wenonah was described as being a "straight-shooter" who held a lot of clarity around her needs, wants, and desires. Helene Kelp, who worked for [employer], was also a caregiver to many of her older siblings. She was the 2nd youngest of 9 children.   Son also shared about Marionette's healthcare journey. Son shared that Marylen was adamant about not receiving any chemo when she was diagnosed with cancer. Though he desired for there to be other interventions, he had to honor his mom's wishes. Chaplain uplifted and affirmed the ways he was able to get her to go to the doctor and obtain a diagnosis.   Chaplain assessed that son and daughter-in-law are in a healthy space of sharing and holding their grief, as well as preparing Ravin's grandchildren.   Chaplain offered support, listening, presence, and space for future care.   Chaplain Sholanda Croson, MDiv  05/09/22 1100  Spiritual Encounters  Type of Visit Initial  Care provided to: Pt and family  Reason for visit End-of-life  Spiritual Framework  Presenting Themes Impactful experiences and emotions;Community and relationships;Goals in life/care  Community/Connection Faith community  Interventions  Spiritual Care Interventions Made Narrative/life review;Normalization of emotions;Reflective listening;Compassionate presence;Established relationship of care and support  Intervention Outcomes  Outcomes Awareness of support;Connection to spiritual care

## 2022-05-09 NOTE — Progress Notes (Signed)
PROGRESS NOTE    ALDA GAUTREAUX  [id number] DOB: [birth year] DOA: 05/07/2022 PCP: Merryl Hacker, No    Brief Narrative:  April Graves is a 67 y.o. female with medical history significant for functional quadriplegia since a fall in 2010, widely metastatic breast cancer not currently on chemotherapy presenting to the emergency department with complaints of altered mental status and difficulty swallowing.  Family called EMS with concerns for the patient having pain and difficulty with swallowing, but on their arrival the patient was altered, unable to provide any history, severely emaciated.  EMS noted foul-smelling necrotic wound to the left breast with maggots as well as a deep sacral decubitus wound.  Family states patient has been able to ambulate until very recently.  She says she has been eating and drinking the last couple of days but complaining of throat pain and occasionally sounding like she is choking when drinking.    Assessment and Plan: Severe sepsis Widely metastatic end-stage breast cancer  -unfortunate 67 year old female with a prior history of functional quadriplegia after fall in 2010, PE as well as diagnosis in November of high-grade invasive ductal carcinoma.  This was first discovered in September 2022, but for some reason was not seen by medical oncology until October 2023.  In October 2023, she already was found to have possible metastatic disease on brain MRI.  During that visit in October, it was felt that patient was not a candidate for chemotherapy due to her very poor baseline functional status, there was some discussion about being able to take an oral palliative treatment, but according to the patient's son she declined further workup with PET scans, etc. that would be required to start the oral chemotherapy.  She chose to forego treatment, and has been living with her son and daughter since that time. Again had a very frank discussion with the patient's son and daughter -- I  strongly recommended that the patient's CODE STATUS be changed to DNR, and that we focus on comfort care. 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   Lactic acidosis    Leukocytosis-likely due to inflammatory state from her metastatic cancer   Thrombocytopenia   Hyperkalemia  -comfort focused care   DVT prophylaxis: SCDs Start: 05/08/22 1012    Code Status: DNR Family Communication: son and daughter at bedside  Disposition Plan:  Level of care: Stepdown Status is: Inpatient     Consultants:  none   Subjective: BP low despite 1L bolus of IVF and abx Spoke with family and plan is to transition to comfort care   Objective: Vitals:   05/09/22 0810 05/09/22 0830 05/09/22 0900 05/09/22 1000  BP: (!) 75/45 (!) 75/51 (!) 76/49 (!) 78/51  Pulse: (!) 122 (!) 123 (!) 122 (!) 123  Resp: (!) 28 (!) 28 (!) 28 (!) 30  Temp:      TempSrc:      SpO2: 98% 97% 97% 98%  Weight:      Height:        Intake/Output Summary (Last 24 hours) at 05/09/2022 1058 Last data filed at 05/09/2022 0800 Gross per 24 hour  Intake 2127 ml  Output 450 ml  Net 1677 ml  Filed Weights   05/08/22 1400  Weight: 41.1 kg    Examination:   General: Appearance:    Thin female unresponsive in bed             Data Reviewed: I have personally reviewed following labs and imaging studies  CBC: Recent Labs  Lab 05/07/22 2241 05/08/22 1211 05/09/22 0321  WBC 32.1* 34.1* 41.6*  NEUTROABS 28.4*  --   --   HGB 12.1 9.5* 8.7*  HCT 39.3 31.8* 28.1*  MCV 80.5 82.8 82.9  PLT 99* 54* 47*   Basic Metabolic Panel: Recent Labs  Lab 05/07/22 2241 05/08/22 1211 05/09/22 0321  NA 128* 129* 133*  K 5.5* 5.1 4.9  CL 97* 104  107  CO2 17* 15* 17*  GLUCOSE 135* 103* 113*  BUN 73* 65* 62*  CREATININE 1.08* 0.78 0.68  CALCIUM 9.4 8.4* 8.8*   GFR: Estimated Creatinine Clearance: 45.5 mL/min (by C-G formula based on SCr of 0.68 mg/dL). Liver Function Tests: Recent Labs  Lab 05/07/22 2241  AST 78*  ALT 13  ALKPHOS 216*  BILITOT 1.3*  PROT 7.2  ALBUMIN 2.4*   No results for input(s): "LIPASE", "AMYLASE" in the last 168 hours. No results for input(s): "AMMONIA" in the last 168 hours. Coagulation Profile: Recent Labs  Lab 05/07/22 2241  INR 1.4*   Cardiac Enzymes: No results for input(s): "CKTOTAL", "CKMB", "CKMBINDEX", "TROPONINI" in the last 168 hours. BNP (last 3 results) No results for input(s): "PROBNP" in the last 8760 hours. HbA1C: No results for input(s): "HGBA1C" in the last 72 hours. CBG: No results for input(s): "GLUCAP" in the last 168 hours. Lipid Profile: No results for input(s): "CHOL", "HDL", "LDLCALC", "TRIG", "CHOLHDL", "LDLDIRECT" in the last 72 hours. Thyroid Function Tests: No results for input(s): "TSH", "T4TOTAL", "FREET4", "T3FREE", "THYROIDAB" in the last 72 hours. Anemia Panel: No results for input(s): "VITAMINB12", "FOLATE", "FERRITIN", "TIBC", "IRON", "RETICCTPCT" in the last 72 hours. Sepsis Labs: Recent Labs  Lab 05/07/22 2241 05/08/22 0050 05/08/22 0515 05/08/22 0748  LATICACIDVEN 3.2* 3.5* 2.3* 2.6*    Recent Results (from the past 240 hour(s))  Resp panel by RT-PCR (RSV, Flu A&B, Covid) Anterior Nasal Swab     Status: None   Collection Time: 05/07/22 12:50 AM   Specimen: Anterior Nasal Swab  Result Value Ref Range Status   SARS Coronavirus 2 by RT PCR NEGATIVE NEGATIVE Final    Comment: (NOTE) SARS-CoV-2 target nucleic acids are NOT DETECTED.  The SARS-CoV-2 RNA is generally detectable in upper respiratory specimens during the acute phase of infection. The lowest concentration of SARS-CoV-2 viral copies this assay can detect is 138 copies/mL. A  negative result does not preclude SARS-Cov-2 infection and should not be used as the sole basis for treatment or other patient management decisions. A negative result may occur with  improper specimen collection/handling, submission of specimen other than nasopharyngeal swab, presence of viral mutation(s) within the areas targeted by this assay, and inadequate number of viral copies(<138 copies/mL). A negative result must be combined with clinical observations, patient history, and epidemiological information. The expected result is Negative.  Fact Sheet for Patients:  EntrepreneurPulse.com.au  Fact Sheet for Healthcare Providers:  IncredibleEmployment.be  This test is no t yet approved or cleared by the [place] FDA and  has been authorized for detection and/or diagnosis of SARS-CoV-2 by FDA under an Emergency Use Authorization (EUA). This EUA will remain  in effect (meaning this test can be used) for the duration of the  COVID-19 declaration under Section 564(b)(1) of the Act, 21 U.S.C.section 360bbb-3(b)(1), unless the authorization is terminated  or revoked sooner.       Influenza A by PCR NEGATIVE NEGATIVE Final   Influenza B by PCR NEGATIVE NEGATIVE Final    Comment: (NOTE) The Xpert Xpress SARS-CoV-2/FLU/RSV plus assay is intended as an aid in the diagnosis of influenza from Nasopharyngeal swab specimens and should not be used as a sole basis for treatment. Nasal washings and aspirates are unacceptable for Xpert Xpress SARS-CoV-2/FLU/RSV testing.  Fact Sheet for Patients: EntrepreneurPulse.com.au  Fact Sheet for Healthcare Providers: IncredibleEmployment.be  This test is not yet approved or cleared by the [place] FDA and has been authorized for detection and/or diagnosis of SARS-CoV-2 by FDA under an Emergency Use Authorization (EUA). This EUA will remain in effect (meaning this test can  be used) for the duration of the COVID-19 declaration under Section 564(b)(1) of the Act, 21 U.S.C. section 360bbb-3(b)(1), unless the authorization is terminated or revoked.     Resp Syncytial Virus by PCR NEGATIVE NEGATIVE Final    Comment: (NOTE) Fact Sheet for Patients: EntrepreneurPulse.com.au  Fact Sheet for Healthcare Providers: IncredibleEmployment.be  This test is not yet approved or cleared by the [place] FDA and has been authorized for detection and/or diagnosis of SARS-CoV-2 by FDA under an Emergency Use Authorization (EUA). This EUA will remain in effect (meaning this test can be used) for the duration of the COVID-19 declaration under Section 564(b)(1) of the Act, 21 U.S.C. section 360bbb-3(b)(1), unless the authorization is terminated or revoked.  Performed at [facility], [place] [address] [place], [place] [address]   Blood Culture (routine x 2)     Status: None (Preliminary result)   Collection Time: 05/07/22 10:41 PM   Specimen: BLOOD  Result Value Ref Range Status   Specimen Description   Final    BLOOD BLOOD RIGHT ARM Performed at [facility] [address] [place], [place] [address]    Special Requests   Final    BOTTLES DRAWN AEROBIC AND ANAEROBIC Blood Culture results may not be optimal due to an inadequate volume of blood received in culture bottles Performed at [facility] [address], [place], [place] [address]    Culture  Setup Time   Final    GRAM NEGATIVE RODS IN BOTH AEROBIC AND ANAEROBIC BOTTLES CRITICAL RESULT CALLED TO, READ BACK BY AND VERIFIED WITH: PHARMD [place] ON 05/08/22 @ 2154 Performed at [facility], [place] [address], [place], [place] [address]    Culture GRAM NEGATIVE RODS  Final   Report Status PENDING  Incomplete  Blood Culture (routine x 2)     Status: None (Preliminary result)   Collection Time: 05/07/22 10:41  PM   Specimen: BLOOD  Result Value Ref Range Status   Specimen Description   Final    BLOOD BLOOD RIGHT ARM Performed at [facility] [address], [place], [place] [address]    Special Requests   Final    BOTTLES DRAWN AEROBIC AND ANAEROBIC Blood Culture adequate volume Performed at [facility] [address] [place], [place] [address]    Culture  Setup Time   Final    GRAM NEGATIVE RODS ANAEROBIC BOTTLE ONLY CRITICAL RESULT CALLED TO, READ BACK BY AND VERIFIED WITH: PHARMD LEANN POINDEXTER ON 05/08/22 @ 1741 BY DRT    Culture   Final    GRAM NEGATIVE RODS IDENTIFICATION  AND SUSCEPTIBILITIES TO FOLLOW Performed at [facility], [place] [address], [place], [place] [address]    Report Status PENDING  Incomplete  Blood Culture ID Panel (Reflexed)     Status: Abnormal   Collection Time: 05/07/22 10:41 PM  Result Value Ref Range Status   Enterococcus faecalis NOT DETECTED NOT DETECTED Final   Enterococcus Faecium NOT DETECTED NOT DETECTED Final   Listeria monocytogenes NOT DETECTED NOT DETECTED Final   Staphylococcus species NOT DETECTED NOT DETECTED Final   Staphylococcus aureus (BCID) NOT DETECTED NOT DETECTED Final   Staphylococcus epidermidis NOT DETECTED NOT DETECTED Final   Staphylococcus lugdunensis NOT DETECTED NOT DETECTED Final   Streptococcus species NOT DETECTED NOT DETECTED Final   Streptococcus agalactiae NOT DETECTED NOT DETECTED Final   Streptococcus pneumoniae NOT DETECTED NOT DETECTED Final   Streptococcus pyogenes NOT DETECTED NOT DETECTED Final   A.calcoaceticus-baumannii NOT DETECTED NOT DETECTED Final   Bacteroides fragilis NOT DETECTED NOT DETECTED Final   Enterobacterales DETECTED (A) NOT DETECTED Final    Comment: Enterobacterales represent a large order of gram negative bacteria, not a single organism. CRITICAL RESULT CALLED TO, READ BACK BY AND VERIFIED WITH: PHARMD LEANN POINDEXTER ON 05/08/22 @ 1741 BY DRT     Enterobacter cloacae complex NOT DETECTED NOT DETECTED Final   Escherichia coli NOT DETECTED NOT DETECTED Final   Klebsiella aerogenes NOT DETECTED NOT DETECTED Final   Klebsiella oxytoca NOT DETECTED NOT DETECTED Final   Klebsiella pneumoniae NOT DETECTED NOT DETECTED Final   Proteus species DETECTED (A) NOT DETECTED Final    Comment: CRITICAL RESULT CALLED TO, READ BACK BY AND VERIFIED WITH: PHARMD LEANN POINDEXTER ON 05/08/22 @ 1741 BY DRT    Salmonella species NOT DETECTED NOT DETECTED Final   Serratia marcescens NOT DETECTED NOT DETECTED Final   Haemophilus influenzae NOT DETECTED NOT DETECTED Final   Neisseria meningitidis NOT DETECTED NOT DETECTED Final   Pseudomonas aeruginosa NOT DETECTED NOT DETECTED Final   Stenotrophomonas maltophilia NOT DETECTED NOT DETECTED Final   Candida albicans NOT DETECTED NOT DETECTED Final   Candida auris NOT DETECTED NOT DETECTED Final   Candida glabrata NOT DETECTED NOT DETECTED Final   Candida krusei NOT DETECTED NOT DETECTED Final   Candida parapsilosis NOT DETECTED NOT DETECTED Final   Candida tropicalis NOT DETECTED NOT DETECTED Final   Cryptococcus neoformans/gattii NOT DETECTED NOT DETECTED Final   CTX-M ESBL NOT DETECTED NOT DETECTED Final   Carbapenem resistance IMP NOT DETECTED NOT DETECTED Final   Carbapenem resistance KPC NOT DETECTED NOT DETECTED Final   Carbapenem resistance NDM NOT DETECTED NOT DETECTED Final   Carbapenem resist OXA 48 LIKE NOT DETECTED NOT DETECTED Final   Carbapenem resistance VIM NOT DETECTED NOT DETECTED Final    Comment: Performed at [facility], [place] [address] [place], [place] [address]  Urine Culture     Status: None   Collection Time: 05/08/22  5:24 AM   Specimen: Urine, Random  Result Value Ref Range Status   Specimen Description   Final    URINE, RANDOM Performed at [facility] [address], [place], [place] [address]    Special Requests   Final    NONE Reflexed from  [id number] Performed at [facility], [place] [address], [place], [place] [address]    Culture   Final    NO GROWTH Performed at [facility], [place] [address], [place], [place] [address]    Report Status 05/09/2022  FINAL  Final  MRSA Next Gen by PCR, Nasal     Status: Abnormal   Collection Time: 05/08/22 10:45 AM   Specimen: Nasal Mucosa; Nasal Swab  Result Value Ref Range Status   MRSA by PCR Next Gen DETECTED (A) NOT DETECTED Final    Comment: (NOTE) The GeneXpert MRSA Assay (FDA approved for NASAL specimens only), is one component of a comprehensive MRSA colonization surveillance program. It is not intended to diagnose MRSA infection nor to guide or monitor treatment for MRSA infections. Test performance is not FDA approved in patients less than 50 years old. Performed at [facility], [place] [address] [place], [place] [address]          Radiology Studies: CT Head Wo Contrast  Result Date: 05/08/2022 CLINICAL DATA:  Altered mental status EXAM: CT HEAD WITHOUT CONTRAST TECHNIQUE: Contiguous axial images were obtained from the base of the skull through the vertex without intravenous contrast. RADIATION DOSE REDUCTION: This exam was performed according to the departmental dose-optimization program which includes automated exposure control, adjustment of the mA and/or kV according to patient size and/or use of iterative reconstruction technique. COMPARISON:  07/02/2021 CTA head FINDINGS: Brain: Hyperdense lesion in the right frontal lobe, which is not well defined but measures approximately 2.1 x 2.2 x 1.4 cm (AP x TR x CC) (series 5, image 21 and series 4, image 19), which is new from the prior exam. Suspect a second hyperdense lesion in the right temporal lobe, measuring approximately 1.2 x 1.6 x 1.2 cm (series 5, image 11 and series 4, image 31), which is also associated with edema. No acute infarct, hemorrhage, or midline shift. No hydrocephalus or  extra-axial collection Vascular: No hyperdense vessel. Atherosclerotic calcifications in the intracranial carotid and vertebral arteries. Skull: Diffusely abnormal calvarium, with a large lytic area with associated soft tissue mass at the parietal vertex, which measures approximately 4.0 x 3.8 x 1.3 cm (series 5, image 26 and series 4, image 44), previously 2.6 x 2.3 x 0.8 cm. The lytic lesion now extends through the full thickness of the calvarium Sinuses/Orbits: Clear paranasal sinuses. Other: No acute finding in the orbits. IMPRESSION: 1. New hyperdense lesions in the right frontal and temporal lobes, concerning for metastatic disease. MRI with and without contrast is recommended for further evaluation. 2. Interval increase in size of a large lytic lesion with associated soft tissue mass at the parietal vertex, which now extends through the full thickness of the calvarium. 3. No acute infarct or hemorrhage. No midline shift. These results were called by telephone at the time of interpretation on 05/08/2022 at 3:15 am to provider [facility] , who verbally acknowledged these results. Electronically Signed   By: Merilyn Baba M.D.   On: 05/08/2022 03:16   CT CHEST ABDOMEN PELVIS W CONTRAST  Result Date: 05/08/2022 CLINICAL DATA:  Sepsis, left breast wound with maggots, history of left breast cancer, leukocytosis EXAM: CT CHEST, ABDOMEN, AND PELVIS WITH CONTRAST TECHNIQUE: Multidetector CT imaging of the chest, abdomen and pelvis was performed following the standard protocol during bolus administration of intravenous contrast. RADIATION DOSE REDUCTION: This exam was performed according to the departmental dose-optimization program which includes automated exposure control, adjustment of the mA and/or kV according to patient size and/or use of iterative reconstruction technique. CONTRAST:  54mL OMNIPAQUE IOHEXOL 300 MG/ML  SOLN COMPARISON:  CT chest 07/01/2021 FINDINGS: CT CHEST FINDINGS Cardiovascular: Mild  coronary artery calcification. Global cardiac size within normal limits. No pericardial effusion.  Central pulmonary arteries are of normal caliber. Thoracic aorta is unremarkable. Mediastinum/Nodes: Visualized thyroid is unremarkable. There has developed extensive pathologic mediastinal, bilateral hilar, and left axillary and subpectoral adenopathy in keeping with widespread thoracic nodal metastases. Pathologic adenopathy within the left hilum results in significant mass effect upon the mid esophagus. The esophagus is otherwise unremarkable. Lungs/Pleura: There are innumerable bilateral pulmonary nodules in keeping with widespread pulmonary metastases with irregular septal thickening noted within the lung apices suspicious for superimposed lymphangitic spread of malignancy. Multiple pleural soft tissue nodules are seen bilaterally in keeping with pleural metastatic disease. No pneumothorax or pleural effusion. There is superimposed nodular peribronchial airspace infiltrate within the right upper lobe which is associated with asymmetric bronchial wall thickening in may represent a superimposed right upper lobe bronchopneumonia. No central obstructing lesion. Musculoskeletal: There are sclerotic lesions involving the mid and distal right clavicle, left sixth rib, and the T3 and T8 vertebral bodies in keeping with osseous metastatic disease. Additionally, there is a destructive soft tissue mass which erodes the body of the sternum in keeping with a lytic osseous metastasis. This appears confluent with the dominant mass within the left breast and extends into the anterior mediastinum to abut the anterior pericardium. The previously noted left breast mass has markedly enlarged, now essentially replacing the left breast tissue, abutting the left chest wall and extending into the subpleural soft tissues and eroding through the dermis with extensive superficial ulceration. Heterogeneous enhancement of the mass suggests  widespread areas of mucinous or necrotic degeneration. CT ABDOMEN PELVIS FINDINGS Hepatobiliary: No focal liver abnormality is seen. No gallstones, gallbladder wall thickening, or biliary dilatation. Pancreas: Unremarkable Spleen: Evaluation of the spleen is limited by motion artifact, however, a wedge-shaped defect within the upper pole the spleen is in keeping with a acute to subacute splenic infarct. Adrenals/Urinary Tract: The adrenal glands are unremarkable. The kidneys are normal in size and position. Multiple wedge-shaped cortical defects are seen within the kidneys bilaterally, best appreciated within the upper pole of the right kidney and posterior interpolar region of the left kidney in keeping with multiple renal infarcts. 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 Multiple nonobstructing calculi are seen within the right kidney measuring up to 10 mm in greatest dimension. No additional renal or ureteral calculi. No hydronephrosis. The bladder is unremarkable. Stomach/Bowel: Large stool ball within the rectal vault suggests fecal impaction. Moderate stool burden. The stomach, small bowel, and large bowel are otherwise unremarkable. No free intraperitoneal gas or fluid. Vascular/Lymphatic: Aortic atherosclerosis. No enlarged abdominal or pelvic lymph nodes. Reproductive: Multiple calcified involuted uterine fibroids are seen within the uterus. No adnexal masses. Other: No abdominal wall hernia. Musculoskeletal: Large sacral decubitus ulcer is present with subcutaneous foci of gas both within the a soft tissues dorsal to the sacrum as well as within the presacral space. There is marked attenuation of the overlying soft tissue and probable exposure of the sacrum with extensive lytic destruction of the mid and distal sacrum. Extensive lytic and sclerotic process diffusely involving the left ilium is associated with diffuse  irregular cortical thickening and trabecular thickening and likely reflects changes of Paget's disease of the bone. Advanced degenerative arthritis is noted of the hips bilaterally with marked remodeling of the humeral heads. Sclerotic metastases are seen within the L1, L2 and possibly the L4 vertebral bodies., IMPRESSION: 1. Marked interval enlargement of the previously noted left breast mass, now essentially replacing the  left breast tissue, abutting the left chest wall and extending into the subpleural soft tissues and eroding through the dermis with extensive superficial ulceration. Heterogeneous enhancement of the mass suggests widespread areas of mucinous or necrotic degeneration. 2. Interval development of extensive pathologic mediastinal, bilateral hilar, and left axillary and subpectoral adenopathy in keeping with widespread thoracic nodal metastases. Pathologic adenopathy within the left hilum results in significant mass effect upon the mid esophagus. 3. Innumerable bilateral pulmonary nodules in keeping with widespread pulmonary metastases. Irregular septal thickening within the lung apices suspicious for superimposed lymphangitic spread of malignancy. 4. Superimposed nodular peribronchial airspace infiltrate within the right upper lobe which is associated with asymmetric bronchial wall thickening in may represent a superimposed right upper lobe bronchopneumonia. 5. Multiple wedge-shaped cortical defects within the kidneys bilaterally in keeping with multiple renal infarcts. 6. Acute to subacute splenic infarct. 7. Large sacral decubitus ulcer with marked attenuation of the overlying soft tissue and probable exposure of the sacrum with extensive lytic destruction of the mid and distal sacrum. 8. Extensive lytic and sclerotic process diffusely involving the left ilium is associated with diffuse irregular cortical thickening and trabecular thickening and likely reflects changes of Paget's disease of the  bone. Aortic Atherosclerosis (ICD10-I70.0). Electronically Signed   By: Fidela Salisbury M.D.   On: 05/08/2022 00:54   DG Chest Port 1 View  Result Date: 05/07/2022 CLINICAL DATA:  Altered EXAM: PORTABLE CHEST 1 VIEW COMPARISON:  07/01/2021 FINDINGS: Asymmetric soft tissue density over left chest likely corresponding to history of breast mass. New heterogeneous bilateral airspace opacities. No pleural effusion or pneumothorax. Normal cardiac size. Faint bilateral nodular lung opacities are concerning for metastatic disease. IMPRESSION: 1. Faint bilateral nodular lung opacities are concerning for metastatic disease. Diffuse heterogeneous interstitial and ground-glass opacities either due to pneumonia or metastatic disease. 2. Asymmetric soft tissue density over the left chest likely corresponding to history of breast mass. Electronically Signed   By: Donavan Foil M.D.   On: 05/07/2022 23:32        Scheduled Meds:  Chlorhexidine Gluconate Cloth  6 each Topical Daily   docusate sodium  100 mg Oral BID   leptospermum manuka honey  1 Application Topical Daily   melatonin  10 mg Oral QHS   multivitamin with minerals  1 tablet Oral Daily   sodium hypochlorite   Irrigation BID   Continuous Infusions:  ampicillin-sulbactam (UNASYN) IV 3 g (05/09/22 0944)     LOS: 1 day    Time spent: 45 minutes spent on chart review, discussion with nursing staff, consultants, updating family and interview/physical exam; more than 50% of that time was spent in counseling and/or coordination of care.    Geradine Girt, DO [facility] Available via Epic secure chat 7am-7pm After these hours, please refer to coverage provider listed on [email or web address] 05/09/2022, 10:58 AM

## 2022-05-09 NOTE — Progress Notes (Signed)
       Overnight   NAME: April Graves MRN: [id number] DOB : [birth year]    Date of Service   05/09/2022   HPI/Events of Note   Notified by RN for low BP. Bolus IVF starting.   Interventions/ Plan   IVF Bolus start Follow BP  Notify if no improvement or decline in status - ongoing       Gershon Cull BSN MSNA MSN [place]

## 2022-05-09 NOTE — Progress Notes (Signed)
Pharmacy Antibiotic Note  April Graves is a 67 y.o. female admitted on 05/07/2022 with sepsis.  Pt has sacral decub as well as fungating wound on left breast. Pharmacy has been consulted for vancomycin and cefepime dosing.  Today, 05/09/22 Afebrile, WBC elevated/up 41.6,  Serum creatinine down to 0.68 and est CrCl improved to 45.5 ml/min  Plan: Continue Cefepime 2 g IV q12h Change Vancomycin to 1000 mg IV q36h (Goal AUC 400-550, eAUC 534.4, SCr used: rounded up to 0.8)  Monitor culture data, renal function. Check vancomycin levels at steady state as needed.  Height: 5\' 6"  (167.6 cm) Weight: 41.1 kg (90 lb 9.7 oz) IBW/kg (Calculated) : 59.3  Temp (24hrs), Avg:98.1 F (36.7 C), Min:97.3 F (36.3 C), Max:99.4 F (37.4 C)  Recent Labs  Lab 05/07/22 2241 05/08/22 0050 05/08/22 0515 05/08/22 0748 05/08/22 1211 05/09/22 0321  WBC 32.1*  --   --   --  34.1* 41.6*  CREATININE 1.08*  --   --   --  0.78 0.68  LATICACIDVEN 3.2* 3.5* 2.3* 2.6*  --   --      Estimated Creatinine Clearance: 45.5 mL/min (by C-G formula based on SCr of 0.68 mg/dL).    Allergies  Allergen Reactions   Codeine Anxiety and Other (See Comments)   Lisinopril Anxiety and Other (See Comments)    Heart Races    Pork-Derived Products     Patient reports she does not eat pork     Antimicrobials this admission: cefepime 4/1 >>  vancomycin 4/1 >>   Microbiology results: 4/1 BCx: Proteus (no ESBL) in 1 out of 4 bottles 4/2 UCx: pending 4/2 MRSA PCR: positive  Suzzanne Cloud, PharmD, BCPS 05/09/2022 7:36 AM

## 2022-05-10 DIAGNOSIS — R6520 Severe sepsis without septic shock: Secondary | ICD-10-CM | POA: Diagnosis not present

## 2022-05-10 DIAGNOSIS — A419 Sepsis, unspecified organism: Secondary | ICD-10-CM | POA: Diagnosis not present

## 2022-05-10 LAB — CULTURE, BLOOD (ROUTINE X 2): Special Requests: ADEQUATE

## 2022-05-10 MED ORDER — GLYCOPYRROLATE 0.2 MG/ML IJ SOLN
0.2000 mg | INTRAMUSCULAR | Status: DC
  Administered 2022-05-10 – 2022-05-11 (×7): 0.2 mg via INTRAVENOUS
  Filled 2022-05-10 (×7): qty 1

## 2022-05-10 NOTE — Progress Notes (Signed)
PROGRESS NOTE    April Graves  [id number] DOB: Aug 21, 1955 DOA: 05/07/2022 PCP: Merryl Hacker, No    Brief Narrative:  April Graves is a 67 y.o. female with medical history significant for functional quadriplegia since a fall in 2010, widely metastatic breast cancer not currently on chemotherapy presenting to the emergency department with complaints of altered mental status and difficulty swallowing.  Family called EMS with concerns for the patient having pain and difficulty with swallowing, but on their arrival the patient was altered, unable to provide any history, severely emaciated.  EMS noted foul-smelling necrotic wound to the left breast with maggots as well as a deep sacral decubitus wound.  Family states patient has been able to ambulate until very recently.  She says she has been eating and drinking the last couple of days but complaining of throat pain and occasionally sounding like she is choking when drinking.    Assessment and Plan: Severe sepsis Widely metastatic end-stage breast cancer  -unfortunate 67 year old female with a prior history of functional quadriplegia after fall in 2010, PE as well as diagnosis in November of high-grade invasive ductal carcinoma.  This was first discovered in September 2022, but for some reason was not seen by medical oncology until October 2023.  In October 2023, she already was found to have possible metastatic disease on brain MRI.  During that visit in October, it was felt that patient was not a candidate for chemotherapy due to her very poor baseline functional status, there was some discussion about being able to take an oral palliative treatment, but according to the patient's son she declined further workup with PET scans, etc. that would be required to start the oral chemotherapy.  She chose to forego treatment, and has been living with her son and daughter since that time. 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   Lactic acidosis    Leukocytosis-likely due to inflammatory state from her metastatic cancer   Thrombocytopenia   Hyperkalemia  -comfort focused care   DVT prophylaxis: SCDs Start: 05/08/22 1012    Code Status: DNR Family Communication:  daughter at bedside  Disposition Plan:  Level of care: Stepdown Status is: Inpatient     Consultants:  none   Subjective: Secretions today    Objective: Vitals:   05/09/22 1600 05/09/22 1700 05/09/22 1800 05/10/22 0800  BP: (!) 67/42 (!) 74/42 (!) 68/39   Pulse: (!) 125 (!) 127 (!) 125 (!) 115  Resp: (!) 30 (!) 24 (!) 30 (!) 31  Temp:      TempSrc:      SpO2: 99% 99% 99% 94%  Weight:      Height:        Intake/Output Summary (Last 24 hours) at 05/10/2022 1145 Last data filed at 05/10/2022 0400 Gross per 24 hour  Intake 602.5 ml  Output 325 ml  Net 277.5 ml   Filed Weights   05/08/22 1400  Weight: 41.1 kg    Examination:   General: Appearance:    Thin female unresponsive in bed, coarse breath sounds but appears comfortable             Data Reviewed: I have personally reviewed following  labs and imaging studies  CBC: Recent Labs  Lab 05/07/22 2241 05/08/22 1211 05/09/22 0321  WBC 32.1* 34.1* 41.6*  NEUTROABS 28.4*  --   --   HGB 12.1 9.5* 8.7*  HCT 39.3 31.8* 28.1*  MCV 80.5 82.8 82.9  PLT 99* 54* 47*   Basic Metabolic Panel: Recent Labs  Lab 05/07/22 2241 05/08/22 1211 05/09/22 0321  NA 128* 129* 133*  K 5.5* 5.1 4.9  CL 97* 104 107  CO2 17* 15* 17*  GLUCOSE 135* 103* 113*  BUN 73* 65* 62*  CREATININE 1.08* 0.78 0.68  CALCIUM 9.4 8.4* 8.8*   GFR: Estimated Creatinine Clearance: 45.5 mL/min (by C-G formula based on SCr of 0.68 mg/dL). Liver  Function Tests: Recent Labs  Lab 05/07/22 2241  AST 78*  ALT 13  ALKPHOS 216*  BILITOT 1.3*  PROT 7.2  ALBUMIN 2.4*   No results for input(s): "LIPASE", "AMYLASE" in the last 168 hours. No results for input(s): "AMMONIA" in the last 168 hours. Coagulation Profile: Recent Labs  Lab 05/07/22 2241  INR 1.4*   Cardiac Enzymes: No results for input(s): "CKTOTAL", "CKMB", "CKMBINDEX", "TROPONINI" in the last 168 hours. BNP (last 3 results) No results for input(s): "PROBNP" in the last 8760 hours. HbA1C: No results for input(s): "HGBA1C" in the last 72 hours. CBG: No results for input(s): "GLUCAP" in the last 168 hours. Lipid Profile: No results for input(s): "CHOL", "HDL", "LDLCALC", "TRIG", "CHOLHDL", "LDLDIRECT" in the last 72 hours. Thyroid Function Tests: No results for input(s): "TSH", "T4TOTAL", "FREET4", "T3FREE", "THYROIDAB" in the last 72 hours. Anemia Panel: No results for input(s): "VITAMINB12", "FOLATE", "FERRITIN", "TIBC", "IRON", "RETICCTPCT" in the last 72 hours. Sepsis Labs: Recent Labs  Lab 05/07/22 2241 05/08/22 0050 05/08/22 0515 05/08/22 0748  LATICACIDVEN 3.2* 3.5* 2.3* 2.6*    Recent Results (from the past 240 hour(s))  Resp panel by RT-PCR (RSV, Flu A&B, Covid) Anterior Nasal Swab     Status: None   Collection Time: 05/07/22 12:50 AM   Specimen: Anterior Nasal Swab  Result Value Ref Range Status   SARS Coronavirus 2 by RT PCR NEGATIVE NEGATIVE Final    Comment: (NOTE) SARS-CoV-2 target nucleic acids are NOT DETECTED.  The SARS-CoV-2 RNA is generally detectable in upper respiratory specimens during the acute phase of infection. The lowest concentration of SARS-CoV-2 viral copies this assay can detect is 138 copies/mL. A negative result does not preclude SARS-Cov-2 infection and should not be used as the sole basis for treatment or other patient management decisions. A negative result may occur with  improper specimen collection/handling,  submission of specimen other than nasopharyngeal swab, presence of viral mutation(s) within the areas targeted by this assay, and inadequate number of viral copies(<138 copies/mL). A negative result must be combined with clinical observations, patient history, and epidemiological information. The expected result is Negative.  Fact Sheet for Patients:  EntrepreneurPulse.com.au  Fact Sheet for Healthcare Providers:  IncredibleEmployment.be  This test is no t yet approved or cleared by the [place] FDA and  has been authorized for detection and/or diagnosis of SARS-CoV-2 by FDA under an Emergency Use Authorization (EUA). This EUA will remain  in effect (meaning this test can be used) for the duration of the COVID-19 declaration under Section 564(b)(1) of the Act, 21 U.S.C.section 360bbb-3(b)(1), unless the authorization is terminated  or revoked sooner.       Influenza A by PCR NEGATIVE NEGATIVE Final   Influenza B by PCR NEGATIVE NEGATIVE Final  Comment: (NOTE) The Xpert Xpress SARS-CoV-2/FLU/RSV plus assay is intended as an aid in the diagnosis of influenza from Nasopharyngeal swab specimens and should not be used as a sole basis for treatment. Nasal washings and aspirates are unacceptable for Xpert Xpress SARS-CoV-2/FLU/RSV testing.  Fact Sheet for Patients: EntrepreneurPulse.com.au  Fact Sheet for Healthcare Providers: IncredibleEmployment.be  This test is not yet approved or cleared by the [place] FDA and has been authorized for detection and/or diagnosis of SARS-CoV-2 by FDA under an Emergency Use Authorization (EUA). This EUA will remain in effect (meaning this test can be used) for the duration of the COVID-19 declaration under Section 564(b)(1) of the Act, 21 U.S.C. section 360bbb-3(b)(1), unless the authorization is terminated or revoked.     Resp Syncytial Virus by PCR NEGATIVE  NEGATIVE Final    Comment: (NOTE) Fact Sheet for Patients: EntrepreneurPulse.com.au  Fact Sheet for Healthcare Providers: IncredibleEmployment.be  This test is not yet approved or cleared by the [place] FDA and has been authorized for detection and/or diagnosis of SARS-CoV-2 by FDA under an Emergency Use Authorization (EUA). This EUA will remain in effect (meaning this test can be used) for the duration of the COVID-19 declaration under Section 564(b)(1) of the Act, 21 U.S.C. section 360bbb-3(b)(1), unless the authorization is terminated or revoked.  Performed at [facility], [place] [address] [place], [place] [address]   Blood Culture (routine x 2)     Status: Abnormal (Preliminary result)   Collection Time: 05/07/22 10:41 PM   Specimen: BLOOD  Result Value Ref Range Status   Specimen Description   Final    BLOOD BLOOD RIGHT ARM Performed at [facility] [address] [place], [place] [address]    Special Requests   Final    BOTTLES DRAWN AEROBIC AND ANAEROBIC Blood Culture results may not be optimal due to an inadequate volume of blood received in culture bottles Performed at [facility] [address], [place], [place] [address]    Culture  Setup Time   Final    GRAM NEGATIVE RODS IN BOTH AEROBIC AND ANAEROBIC BOTTLES CRITICAL RESULT CALLED TO, READ BACK BY AND VERIFIED WITH: [place] ON 05/08/22 @ 2154    Culture (A)  Final    PROTEUS MIRABILIS SUSCEPTIBILITIES PERFORMED ON PREVIOUS CULTURE WITHIN THE LAST 5 DAYS. CULTURE REINCUBATED FOR BETTER GROWTH Performed at [facility], [place] [address] [place], [place] [address]    Report Status PENDING  Incomplete  Blood Culture (routine x 2)     Status: Abnormal   Collection Time: 05/07/22 10:41 PM   Specimen: BLOOD  Result Value Ref Range Status   Specimen Description   Final    BLOOD BLOOD RIGHT  ARM Performed at [facility] [address], [place], [place] [address]    Special Requests   Final    BOTTLES DRAWN AEROBIC AND ANAEROBIC Blood Culture adequate volume Performed at [facility] [address] [place],  [address]    Culture  Setup Time   Final    GRAM NEGATIVE RODS ANAEROBIC BOTTLE ONLY CRITICAL RESULT CALLED TO, READ BACK BY AND VERIFIED WITH: PHARMD LEANN POINDEXTER ON 05/08/22 @ 1741 BY DRT    Culture PROTEUS MIRABILIS (A)  Final   Report Status 05/10/2022 FINAL  Final   Organism ID, Bacteria PROTEUS MIRABILIS  Final      Susceptibility   Proteus mirabilis - MIC*    AMPICILLIN <=  2 SENSITIVE Sensitive     CEFEPIME 0.5 SENSITIVE Sensitive     CEFTAZIDIME <=1 SENSITIVE Sensitive     CEFTRIAXONE <=0.25 SENSITIVE Sensitive     CIPROFLOXACIN <=0.25 SENSITIVE Sensitive     GENTAMICIN <=1 SENSITIVE Sensitive     IMIPENEM 2 SENSITIVE Sensitive     TRIMETH/SULFA <=20 SENSITIVE Sensitive     AMPICILLIN/SULBACTAM <=2 SENSITIVE Sensitive     PIP/TAZO <=4 SENSITIVE Sensitive     * PROTEUS MIRABILIS  Blood Culture ID Panel (Reflexed)     Status: Abnormal   Collection Time: 05/07/22 10:41 PM  Result Value Ref Range Status   Enterococcus faecalis NOT DETECTED NOT DETECTED Final   Enterococcus Faecium NOT DETECTED NOT DETECTED Final   Listeria monocytogenes NOT DETECTED NOT DETECTED Final   Staphylococcus species NOT DETECTED NOT DETECTED Final   Staphylococcus aureus (BCID) NOT DETECTED NOT DETECTED Final   Staphylococcus epidermidis NOT DETECTED NOT DETECTED Final   Staphylococcus lugdunensis NOT DETECTED NOT DETECTED Final   Streptococcus species NOT DETECTED NOT DETECTED Final   Streptococcus agalactiae NOT DETECTED NOT DETECTED Final   Streptococcus pneumoniae NOT DETECTED NOT DETECTED Final   Streptococcus pyogenes NOT DETECTED NOT DETECTED Final   A.calcoaceticus-baumannii NOT DETECTED NOT DETECTED Final   Bacteroides  fragilis NOT DETECTED NOT DETECTED Final   Enterobacterales DETECTED (A) NOT DETECTED Final    Comment: Enterobacterales represent a large order of gram negative bacteria, not a single organism. CRITICAL RESULT CALLED TO, READ BACK BY AND VERIFIED WITH: PHARMD LEANN POINDEXTER ON 05/08/22 @ 1741 BY DRT    Enterobacter cloacae complex NOT DETECTED NOT DETECTED Final   Escherichia coli NOT DETECTED NOT DETECTED Final   Klebsiella aerogenes NOT DETECTED NOT DETECTED Final   Klebsiella oxytoca NOT DETECTED NOT DETECTED Final   Klebsiella pneumoniae NOT DETECTED NOT DETECTED Final   Proteus species DETECTED (A) NOT DETECTED Final    Comment: CRITICAL RESULT CALLED TO, READ BACK BY AND VERIFIED WITH: PHARMD LEANN POINDEXTER ON 05/08/22 @ 1741 BY DRT    Salmonella species NOT DETECTED NOT DETECTED Final   Serratia marcescens NOT DETECTED NOT DETECTED Final   Haemophilus influenzae NOT DETECTED NOT DETECTED Final   Neisseria meningitidis NOT DETECTED NOT DETECTED Final   Pseudomonas aeruginosa NOT DETECTED NOT DETECTED Final   Stenotrophomonas maltophilia NOT DETECTED NOT DETECTED Final   Candida albicans NOT DETECTED NOT DETECTED Final   Candida auris NOT DETECTED NOT DETECTED Final   Candida glabrata NOT DETECTED NOT DETECTED Final   Candida krusei NOT DETECTED NOT DETECTED Final   Candida parapsilosis NOT DETECTED NOT DETECTED Final   Candida tropicalis NOT DETECTED NOT DETECTED Final   Cryptococcus neoformans/gattii NOT DETECTED NOT DETECTED Final   CTX-M ESBL NOT DETECTED NOT DETECTED Final   Carbapenem resistance IMP NOT DETECTED NOT DETECTED Final   Carbapenem resistance KPC NOT DETECTED NOT DETECTED Final   Carbapenem resistance NDM NOT DETECTED NOT DETECTED Final   Carbapenem resist OXA 48 LIKE NOT DETECTED NOT DETECTED Final   Carbapenem resistance VIM NOT DETECTED NOT DETECTED Final    Comment: Performed at [facility], [place] [address] [place], [place] [address]  Urine  Culture     Status: None   Collection Time: 05/08/22  5:24 AM   Specimen: Urine, Random  Result Value Ref Range Status   Specimen Description   Final    URINE, RANDOM Performed at [facility] [address] [place], [place] [address]  Special Requests   Final    NONE Reflexed from [id number] Performed at [facility], [place] [address] [place], [place] [address]    Culture   Final    NO GROWTH Performed at [facility], [place] [address], [place], [place] [address]    Report Status 05/09/2022 FINAL  Final  MRSA Next Gen by PCR, Nasal     Status: Abnormal   Collection Time: 05/08/22 10:45 AM   Specimen: Nasal Mucosa; Nasal Swab  Result Value Ref Range Status   MRSA by PCR Next Gen DETECTED (A) NOT DETECTED Final    Comment: (NOTE) The GeneXpert MRSA Assay (FDA approved for NASAL specimens only), is one component of a comprehensive MRSA colonization surveillance program. It is not intended to diagnose MRSA infection nor to guide or monitor treatment for MRSA infections. Test performance is not FDA approved in patients less than 62 years old. Performed at [facility], [place] [address] [place], [place] [address]          Radiology Studies: No results found.      Scheduled Meds:  glycopyrrolate  0.2 mg Intravenous Q4H   leptospermum manuka honey  1 Application Topical Daily   melatonin  10 mg Oral QHS   multivitamin with minerals  1 tablet Oral Daily   Continuous Infusions:     LOS: 2 days    Time spent: 45 minutes spent on chart review, discussion with nursing staff, consultants, updating family and interview/physical exam; more than 50% of that time was spent in counseling and/or coordination of care.    Geradine Girt, DO [facility] Available via Epic secure chat 7am-7pm After these hours, please refer to coverage provider listed on [email or web address] 05/10/2022, 11:45 AM

## 2022-05-10 NOTE — Progress Notes (Signed)
This RN came back from transferring another pt and was told that APS SW was in the room with family. After a few minutes the pt's son, Consuelo Pandy, and the SW, Tim Lair came into the hallway to ask this RN which oncologist had seen the pt. The SW wanted to go back into the pt's room to keep talking, but the son wanted to stay in the hallway with the RN. Charlann Lange was emotional and upset that the APS SW came today. Charlann Lange was respectful, did not use foul language or show any kind of aggression. He said "You come here like an FBI investigator asking all these questions when I'm trying to comfort my sister and hold myself together. Were you trying to embarrass me in front of all these people? You broke me". The APS SW responded that she was not trying to embarrass him. She wanted to ask him the questions in the pt's room. She said "I only came to see the pt today. I have a timeframe that I have to do this in. When you said that you were the POA I asked the questions I have to ask you".   They went back and forth a few times about Lorie Phenix being upset about the timing of the questions and the APS SW explaining that she had to come see the pt and then began asking him questions once he said he was the POA. It ended with the APS SW apologizing for upsetting him and leaving the unit.

## 2022-05-10 NOTE — Progress Notes (Signed)
Nutrition Follow-up  DOCUMENTATION CODES:   Severe malnutrition in context of chronic illness  INTERVENTION:  ***   NUTRITION DIAGNOSIS:   Severe Malnutrition related to chronic illness (ongoing confusion and lethargy) as evidenced by percent weight loss, severe fat depletion, severe muscle depletion.  ***  GOAL:   Patient will meet greater than or equal to 90% of their needs  ***  MONITOR:   PO intake, Supplement acceptance  REASON FOR ASSESSMENT:   Consult Assessment of nutrition requirement/status  ASSESSMENT:   Pt is an 67yo M with PMH of CHF, a-fib, CAD, HLD, obesity, diverticulosis, DM, GERD, and HTN who presents with generalized decline.  ***    Medications reviewed and include: Colace, MVI with minerals, Warfarin  Labs reviewed:  Creatinine 2.24 HA1C 6.5 Blood Glucose 86-165 x24 hours   Diet Order:   Diet Order             DIET DYS 3 Room service appropriate? No; Fluid consistency: Thin  Diet effective now                   EDUCATION NEEDS:   Education needs have been addressed  Skin:  Skin Assessment: Reviewed RN Assessment ***  Last BM:  11/13 ***  Height:   Ht Readings from Last 1 Encounters:  12/21/21 5' 9" (1.753 m)    Weight:   Wt Readings from Last 1 Encounters:  12/26/21 84.3 kg    Ideal Body Weight:     BMI:  Body mass index is 27.44 kg/m.  Estimated Nutritional Needs:   Kcal:  1995-2395kcal  Protein:  80-95g protein  Fluid:  1995-2395mL    Richardson Dubree RD, LDN For contact information, refer to AMiON.   

## 2022-05-11 DIAGNOSIS — A419 Sepsis, unspecified organism: Secondary | ICD-10-CM | POA: Diagnosis not present

## 2022-05-11 DIAGNOSIS — R6520 Severe sepsis without septic shock: Secondary | ICD-10-CM | POA: Diagnosis not present

## 2022-05-12 LAB — CULTURE, BLOOD (ROUTINE X 2)

## 2022-06-06 NOTE — Progress Notes (Signed)
Patient became bradycardic and having more periods of apnea, daughter at bedside notified. Patient subsequently went into asystole. Upon assessment, no heart/lung sounds auscultated, verified by Mamie Nick, RN and Geni Bers, RN. Family at bedside, emotional support provided, MD notified. Time of death 12:36pm. Chaplain called to bedside.

## 2022-06-06 NOTE — Progress Notes (Signed)
PROGRESS NOTE    April Graves  ZOX:[id number] DOB: 02-22-1955 DOA: 05/30/2022 PCP: Oneita HurtPcp, No    Brief Narrative:  April Graves is a 67 y.o. female with medical history significant for functional quadriplegia since a fall in 2010, widely metastatic breast cancer not currently on chemotherapy presenting to the emergency department with complaints of altered mental status and difficulty swallowing.  Family called EMS with concerns for the patient having pain and difficulty with swallowing, but on their arrival the patient was altered, unable to provide any history, severely emaciated.  EMS noted foul-smelling necrotic wound to the left breast with maggots as well as a deep sacral decubitus wound.  Family states patient has been able to ambulate until very recently.  She says she has been eating and drinking the last couple of days but complaining of throat pain and occasionally sounding like she is choking when drinking.    Assessment and Plan: Severe sepsis Widely metastatic end-stage breast cancer  -unfortunate 67 year old female with a prior history of functional quadriplegia after fall in 2010, PE as well as diagnosis in November of high-grade invasive ductal carcinoma.  This was first discovered in September 2022, but for some reason was not seen by medical oncology until October 2023.  In October 2023, she already was found to have possible metastatic disease on brain MRI.  During that visit in October, it was felt that patient was not a candidate for chemotherapy due to her very poor baseline functional status, there was some discussion about being able to take an oral palliative treatment, but according to the patient's son she declined further workup with PET scans, etc. that would be required to start the oral chemotherapy.  She chose to forego treatment, and has been living with her son and daughter since that time. 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   Lactic acidosis    Leukocytosis-likely due to inflammatory state from her metastatic cancer   Thrombocytopenia   Hyperkalemia  -comfort focused care   DVT prophylaxis: SCDs Start: 05/08/22 1012    Code Status: DNR Family Communication:  daughter/son at bedside  Disposition Plan:  Level of care: Palliative Care Status is: Inpatient     Consultants:  none   Subjective: Mildly increased work of breathing   Objective: Vitals:   05/09/22 1800 05/10/22 0800 05/10/22 2000 12/03/2022 0716  BP: (!) 68/39     Pulse: (!) 125 (!) 115 (!) 133   Resp: (!) 30 (!) 31 (!) 29 (!) 26  Temp:      TempSrc:      SpO2: 99% 94% 98%   Weight:      Height:        Intake/Output Summary (Last 24 hours) at 05/26/2022 0813 Last data filed at 05/10/2022 1800 Gross per 24 hour  Intake --  Output 190 ml  Net -190 ml   Filed Weights   05/08/22 1400  Weight: 41.1 kg    Examination:   General: Appearance:    Thin female unresponsive in bed, coarse breath sounds with mild increased work of breathing             Data Reviewed: I have personally  reviewed following labs and imaging studies  CBC: Recent Labs  Lab 05/10/2022 2241 05/08/22 1211 05/09/22 0321  WBC 32.1* 34.1* 41.6*  NEUTROABS 28.4*  --   --   HGB 12.1 9.5* 8.7*  HCT 39.3 31.8* 28.1*  MCV 80.5 82.8 82.9  PLT 99* 54* 47*   Basic Metabolic Panel: Recent Labs  Lab 05/17/2022 2241 05/08/22 1211 05/09/22 0321  NA 128* 129* 133*  K 5.5* 5.1 4.9  CL 97* 104 107  CO2 17* 15* 17*  GLUCOSE 135* 103* 113*  BUN 73* 65* 62*  CREATININE 1.08* 0.78 0.68  CALCIUM 9.4 8.4* 8.8*   GFR: Estimated Creatinine Clearance: 45.5 mL/min (by C-G formula based on SCr of 0.68  mg/dL). Liver Function Tests: Recent Labs  Lab 05/31/2022 2241  AST 78*  ALT 13  ALKPHOS 216*  BILITOT 1.3*  PROT 7.2  ALBUMIN 2.4*   No results for input(s): "LIPASE", "AMYLASE" in the last 168 hours. No results for input(s): "AMMONIA" in the last 168 hours. Coagulation Profile: Recent Labs  Lab 05/18/2022 2241  INR 1.4*   Cardiac Enzymes: No results for input(s): "CKTOTAL", "CKMB", "CKMBINDEX", "TROPONINI" in the last 168 hours. BNP (last 3 results) No results for input(s): "PROBNP" in the last 8760 hours. HbA1C: No results for input(s): "HGBA1C" in the last 72 hours. CBG: No results for input(s): "GLUCAP" in the last 168 hours. Lipid Profile: No results for input(s): "CHOL", "HDL", "LDLCALC", "TRIG", "CHOLHDL", "LDLDIRECT" in the last 72 hours. Thyroid Function Tests: No results for input(s): "TSH", "T4TOTAL", "FREET4", "T3FREE", "THYROIDAB" in the last 72 hours. Anemia Panel: No results for input(s): "VITAMINB12", "FOLATE", "FERRITIN", "TIBC", "IRON", "RETICCTPCT" in the last 72 hours. Sepsis Labs: Recent Labs  Lab 06/01/2022 2241 05/08/22 0050 05/08/22 0515 05/08/22 0748  LATICACIDVEN 3.2* 3.5* 2.3* 2.6*    Recent Results (from the past 240 hour(s))  Resp panel by RT-PCR (RSV, Flu A&B, Covid) Anterior Nasal Swab     Status: None   Collection Time: 05/28/2022 12:50 AM   Specimen: Anterior Nasal Swab  Result Value Ref Range Status   SARS Coronavirus 2 by RT PCR NEGATIVE NEGATIVE Final    Comment: (NOTE) SARS-CoV-2 target nucleic acids are NOT DETECTED.  The SARS-CoV-2 RNA is generally detectable in upper respiratory specimens during the acute phase of infection. The lowest concentration of SARS-CoV-2 viral copies this assay can detect is 138 copies/mL. A negative result does not preclude SARS-Cov-2 infection and should not be used as the sole basis for treatment or other patient management decisions. A negative result may occur with  improper specimen  collection/handling, submission of specimen other than nasopharyngeal swab, presence of viral mutation(s) within the areas targeted by this assay, and inadequate number of viral copies(<138 copies/mL). A negative result must be combined with clinical observations, patient history, and epidemiological information. The expected result is Negative.  Fact Sheet for Patients:  BloggerCourse.com  Fact Sheet for Healthcare Providers:  SeriousBroker.it  This test is no t yet approved or cleared by the [place] FDA and  has been authorized for detection and/or diagnosis of SARS-CoV-2 by FDA under an Emergency Use Authorization (EUA). This EUA will remain  in effect (meaning this test can be used) for the duration of the COVID-19 declaration under Section 564(b)(1) of the Act, 21 U.S.C.section 360bbb-3(b)(1), unless the authorization is terminated  or revoked sooner.       Influenza A by PCR NEGATIVE NEGATIVE Final   Influenza B by PCR NEGATIVE NEGATIVE Final  Comment: (NOTE) The Xpert Xpress SARS-CoV-2/FLU/RSV plus assay is intended as an aid in the diagnosis of influenza from Nasopharyngeal swab specimens and should not be used as a sole basis for treatment. Nasal washings and aspirates are unacceptable for Xpert Xpress SARS-CoV-2/FLU/RSV testing.  Fact Sheet for Patients: BloggerCourse.com  Fact Sheet for Healthcare Providers: SeriousBroker.it  This test is not yet approved or cleared by the [place] FDA and has been authorized for detection and/or diagnosis of SARS-CoV-2 by FDA under an Emergency Use Authorization (EUA). This EUA will remain in effect (meaning this test can be used) for the duration of the COVID-19 declaration under Section 564(b)(1) of the Act, 21 U.S.C. section 360bbb-3(b)(1), unless the authorization is terminated or revoked.     Resp Syncytial  Virus by PCR NEGATIVE NEGATIVE Final    Comment: (NOTE) Fact Sheet for Patients: BloggerCourse.com  Fact Sheet for Healthcare Providers: SeriousBroker.it  This test is not yet approved or cleared by the [place] FDA and has been authorized for detection and/or diagnosis of SARS-CoV-2 by FDA under an Emergency Use Authorization (EUA). This EUA will remain in effect (meaning this test can be used) for the duration of the COVID-19 declaration under Section 564(b)(1) of the Act, 21 U.S.C. section 360bbb-3(b)(1), unless the authorization is terminated or revoked.  Performed at [facility], [address], [place], [place] [address]   Blood Culture (routine x 2)     Status: Abnormal (Preliminary result)   Collection Time: 05/23/2022 10:41 PM   Specimen: BLOOD  Result Value Ref Range Status   Specimen Description   Final    BLOOD BLOOD RIGHT ARM Performed at [facility], [address], [place], [place] [address]    Special Requests   Final    BOTTLES DRAWN AEROBIC AND ANAEROBIC Blood Culture results may not be optimal due to an inadequate volume of blood received in culture bottles Performed at [facility], [address] [place], [place] [address]    Culture  Setup Time   Final    GRAM NEGATIVE RODS IN BOTH AEROBIC AND ANAEROBIC BOTTLES CRITICAL RESULT CALLED TO, READ BACK BY AND VERIFIED WITH: PHARMD ELLEN JACKSON ON 05/08/22 @ 2154    Culture (A)  Final    PROTEUS MIRABILIS SUSCEPTIBILITIES PERFORMED ON PREVIOUS CULTURE WITHIN THE LAST 5 DAYS. CULTURE REINCUBATED FOR BETTER GROWTH Performed at [facility], [address] [place], [place] [address]    Report Status PENDING  Incomplete  Blood Culture (routine x 2)     Status: Abnormal   Collection Time: 05/30/2022 10:41 PM   Specimen: BLOOD  Result Value Ref Range Status   Specimen Description   Final     BLOOD BLOOD RIGHT ARM Performed at [facility], [address], [place], [place] [address]    Special Requests   Final    BOTTLES DRAWN AEROBIC AND ANAEROBIC Blood Culture adequate volume Performed at [facility], [address] [place], [place] [address]    Culture  Setup Time   Final    GRAM NEGATIVE RODS ANAEROBIC BOTTLE ONLY CRITICAL RESULT CALLED TO, READ BACK BY AND VERIFIED WITH: PHARMD LEANN POINDEXTER ON 05/08/22 @ 1741 BY DRT    Culture PROTEUS MIRABILIS (A)  Final   Report Status 05/10/2022 FINAL  Final   Organism ID, Bacteria PROTEUS MIRABILIS  Final      Susceptibility   Proteus mirabilis - MIC*    AMPICILLIN <=  2 SENSITIVE Sensitive     CEFEPIME 0.5 SENSITIVE Sensitive     CEFTAZIDIME <=1 SENSITIVE Sensitive     CEFTRIAXONE <=0.25 SENSITIVE Sensitive     CIPROFLOXACIN <=0.25 SENSITIVE Sensitive     GENTAMICIN <=1 SENSITIVE Sensitive     IMIPENEM 2 SENSITIVE Sensitive     TRIMETH/SULFA <=20 SENSITIVE Sensitive     AMPICILLIN/SULBACTAM <=2 SENSITIVE Sensitive     PIP/TAZO <=4 SENSITIVE Sensitive     * PROTEUS MIRABILIS  Blood Culture ID Panel (Reflexed)     Status: Abnormal   Collection Time: June 03, 2022 10:41 PM  Result Value Ref Range Status   Enterococcus faecalis NOT DETECTED NOT DETECTED Final   Enterococcus Faecium NOT DETECTED NOT DETECTED Final   Listeria monocytogenes NOT DETECTED NOT DETECTED Final   Staphylococcus species NOT DETECTED NOT DETECTED Final   Staphylococcus aureus (BCID) NOT DETECTED NOT DETECTED Final   Staphylococcus epidermidis NOT DETECTED NOT DETECTED Final   Staphylococcus lugdunensis NOT DETECTED NOT DETECTED Final   Streptococcus species NOT DETECTED NOT DETECTED Final   Streptococcus agalactiae NOT DETECTED NOT DETECTED Final   Streptococcus pneumoniae NOT DETECTED NOT DETECTED Final   Streptococcus pyogenes NOT DETECTED NOT DETECTED Final   A.calcoaceticus-baumannii NOT DETECTED NOT DETECTED  Final   Bacteroides fragilis NOT DETECTED NOT DETECTED Final   Enterobacterales DETECTED (A) NOT DETECTED Final    Comment: Enterobacterales represent a large order of gram negative bacteria, not a single organism. CRITICAL RESULT CALLED TO, READ BACK BY AND VERIFIED WITH: PHARMD LEANN POINDEXTER ON 05/08/22 @ 1741 BY DRT    Enterobacter cloacae complex NOT DETECTED NOT DETECTED Final   Escherichia coli NOT DETECTED NOT DETECTED Final   Klebsiella aerogenes NOT DETECTED NOT DETECTED Final   Klebsiella oxytoca NOT DETECTED NOT DETECTED Final   Klebsiella pneumoniae NOT DETECTED NOT DETECTED Final   Proteus species DETECTED (A) NOT DETECTED Final    Comment: CRITICAL RESULT CALLED TO, READ BACK BY AND VERIFIED WITH: PHARMD LEANN POINDEXTER ON 05/08/22 @ 1741 BY DRT    Salmonella species NOT DETECTED NOT DETECTED Final   Serratia marcescens NOT DETECTED NOT DETECTED Final   Haemophilus influenzae NOT DETECTED NOT DETECTED Final   Neisseria meningitidis NOT DETECTED NOT DETECTED Final   Pseudomonas aeruginosa NOT DETECTED NOT DETECTED Final   Stenotrophomonas maltophilia NOT DETECTED NOT DETECTED Final   Candida albicans NOT DETECTED NOT DETECTED Final   Candida auris NOT DETECTED NOT DETECTED Final   Candida glabrata NOT DETECTED NOT DETECTED Final   Candida krusei NOT DETECTED NOT DETECTED Final   Candida parapsilosis NOT DETECTED NOT DETECTED Final   Candida tropicalis NOT DETECTED NOT DETECTED Final   Cryptococcus neoformans/gattii NOT DETECTED NOT DETECTED Final   CTX-M ESBL NOT DETECTED NOT DETECTED Final   Carbapenem resistance IMP NOT DETECTED NOT DETECTED Final   Carbapenem resistance KPC NOT DETECTED NOT DETECTED Final   Carbapenem resistance NDM NOT DETECTED NOT DETECTED Final   Carbapenem resist OXA 48 LIKE NOT DETECTED NOT DETECTED Final   Carbapenem resistance VIM NOT DETECTED NOT DETECTED Final    Comment: Performed at [facility], [address], [place], [place]  [address]  Urine Culture     Status: None   Collection Time: 05/08/22  5:24 AM   Specimen: Urine, Random  Result Value Ref Range Status   Specimen Description   Final    URINE, RANDOM Performed at [facility], [address], [place], [place] [address]  Special Requests   Final    NONE Reflexed from [id number] Performed at [facility], [address] [place], [place] [address]    Culture   Final    NO GROWTH Performed at [facility], [address] [place], [place] [address]    Report Status 05/09/2022 FINAL  Final  MRSA Next Gen by PCR, Nasal     Status: Abnormal   Collection Time: 05/08/22 10:45 AM   Specimen: Nasal Mucosa; Nasal Swab  Result Value Ref Range Status   MRSA by PCR Next Gen DETECTED (A) NOT DETECTED Final    Comment: (NOTE) The GeneXpert MRSA Assay (FDA approved for NASAL specimens only), is one component of a comprehensive MRSA colonization surveillance program. It is not intended to diagnose MRSA infection nor to guide or monitor treatment for MRSA infections. Test performance is not FDA approved in patients less than 57 years old. Performed at [facility], [address] [place], [place] [address]          Radiology Studies: No results found.      Scheduled Meds:  glycopyrrolate  0.2 mg Intravenous Q4H   leptospermum manuka honey  1 Application Topical Daily   melatonin  10 mg Oral QHS   multivitamin with minerals  1 tablet Oral Daily   Continuous Infusions:     LOS: 3 days    Time spent: 45 minutes spent on chart review, discussion with nursing staff, consultants, updating family and interview/physical exam; more than 50% of that time was spent in counseling and/or coordination of care.    Joseph Art, DO [facility] Available via Epic secure chat 7am-7pm After these hours, please refer to coverage provider listed on [email or web address] 05/23/2022, 8:13 AM
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
## 2022-06-06 NOTE — Death Summary Note (Signed)
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  This was first discovered in September 2022, but for some reason was not seen by medical oncology until October 2023.  In October 2023, she already was found to have possible metastatic disease on brain MRI.  During that visit in October, it was felt that patient was not a candidate for chemotherapy due to her very poor baseline functional status, there was some discussion about being able to take an oral palliative treatment, but according to the patient's son she declined further workup with PET scans, etc. that would be required to start the oral chemotherapy.  She chose to forego treatment, and has been living with her son and daughter since that time. 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   Lactic acidosis    Leukocytosis-likely due to inflammatory state from her metastatic cancer   Thrombocytopenia   Hyperkalemia    Nutrition Documentation    Flowsheet Row ED to Hosp-Admission (Current) from 2022/05/21 in [facility]-ICU/STEPDOWN  Nutrition Problem Severe Malnutrition  Etiology chronic illness  [metastatic breast cancer]  Nutrition Goal Patient will meet greater than or equal to 90% of their needs  Interventions Refer to RD note for recommendations, MVI, Boost Plus     ,  Active Pressure Injury/Wound(s)     Pressure Ulcer  Duration          Pressure Ulcer 03/07/15 Stage III -  Full thickness tissue loss. Subcutaneous fat may be visible but bone, tendon or muscle are NOT exposed. 06-28-19 days   Pressure Injury 2022/05/21 Back Mid Stage 2 -  Partial thickness loss of dermis presenting as a shallow open injury with a red, pink wound bed without slough. 3 days   Pressure Injury 05/21/2022 Coccyx Lower;Medial;Mid Stage 3 -  Full thickness tissue loss. Subcutaneous fat may be visible but bone, tendon or muscle are NOT exposed. 3 days   Pressure Injury 2022-05-21 Coccyx Right Stage 2 -  Partial thickness loss of dermis presenting as a shallow open injury with a red, pink wound bed without slough. 3 days   Pressure Injury 05/21/22 Hip Left Stage 2 -  Partial thickness loss of dermis presenting  as a shallow open injury with a red, pink wound bed without slough. stage IV with tunnelling left hip 3 days   Pressure Injury 05/08/22 Back Lateral;Upper;Left Deep Tissue Pressure Injury - Purple or maroon localized area of discolored intact skin or blood-filled blister due to damage of underlying soft tissue from pressure and/or shear. 3 days   Pressure Injury 05/08/22 Back Right;Upper Unstageable - Full thickness tissue loss in which the base of the injury is covered by slough  (yellow, tan, gray, green or brown) and/or eschar (tan, brown or black) in the wound bed. 3 days   Pressure Injury 05/08/22 Knee Left;Medial Deep Tissue Pressure Injury - Purple or maroon localized area of discolored intact skin or blood-filled blister due to damage of underlying soft tissue from pressure and/or shear. 3 days   Pressure Injury 05/08/22 Knee Right;Lateral Stage 2 -  Partial thickness loss of dermis presenting as a shallow open injury with a red, pink wound bed without slough. 3 days   Pressure Injury 05/08/22 Vertebral column Upper;Mid Stage 2 -  Partial thickness loss of dermis presenting as a shallow open injury with a red, pink wound bed without slough. with surround DTI 3 days             The results of significant diagnostics from this hospitalization (including imaging, microbiology, ancillary and laboratory) are listed below for reference.   Significant Diagnostic Studies: CT Head Wo Contrast  Result Date: 05/08/2022 CLINICAL DATA:  Altered mental status EXAM: CT HEAD WITHOUT CONTRAST TECHNIQUE: Contiguous axial images were obtained from the base of the skull through the vertex without intravenous contrast. RADIATION DOSE REDUCTION: This exam was performed according to the departmental dose-optimization program which includes automated exposure control, adjustment of the mA and/or kV according to patient size and/or use of iterative reconstruction technique. COMPARISON:  07/02/2021 CTA head FINDINGS: Brain: Hyperdense lesion in the right frontal lobe, which is not well defined but measures approximately 2.1 x 2.2 x 1.4 cm (AP x TR x CC) (series 5, image 21 and series 4, image 19), which is new from the prior exam. Suspect a second hyperdense lesion in the right temporal lobe, measuring approximately 1.2 x 1.6 x 1.2 cm (series 5, image 11 and series 4, image 31), which is also associated with edema. No acute infarct, hemorrhage, or midline shift. No hydrocephalus or extra-axial  collection Vascular: No hyperdense vessel. Atherosclerotic calcifications in the intracranial carotid and vertebral arteries. Skull: Diffusely abnormal calvarium, with a large lytic area with associated soft tissue mass at the parietal vertex, which measures approximately 4.0 x 3.8 x 1.3 cm (series 5, image 26 and series 4, image 44), previously 2.6 x 2.3 x 0.8 cm. The lytic lesion now extends through the full thickness of the calvarium Sinuses/Orbits: Clear paranasal sinuses. Other: No acute finding in the orbits. IMPRESSION: 1. New hyperdense lesions in the right frontal and temporal lobes, concerning for metastatic disease. MRI with and without contrast is recommended for further evaluation. 2. Interval increase in size of a large lytic lesion with associated soft tissue mass at the parietal vertex, which now extends through the full thickness of the calvarium. 3. No acute infarct or hemorrhage. No midline shift. These results were called by telephone at the time of interpretation on 05/08/2022 at 3:15 am to provider [facility] , who verbally acknowledged these results. Electronically Signed   By: Wiliam Ke M.D.   On: 05/08/2022 03:16   CT CHEST ABDOMEN PELVIS W CONTRAST  Result Date: 05/08/2022 CLINICAL DATA:  Sepsis, left breast wound with maggots, history of left breast cancer, leukocytosis EXAM: CT CHEST, ABDOMEN, AND PELVIS WITH CONTRAST TECHNIQUE: Multidetector CT imaging of the chest, abdomen and pelvis was performed following the standard protocol during bolus administration of intravenous contrast. RADIATION DOSE REDUCTION: This exam was performed according to the departmental dose-optimization program which includes automated exposure control, adjustment of the mA and/or kV according to patient size and/or use of iterative reconstruction technique. CONTRAST:  70mL OMNIPAQUE IOHEXOL 300 MG/ML  SOLN COMPARISON:  CT chest 07/01/2021 FINDINGS: CT CHEST FINDINGS Cardiovascular: Mild coronary artery  calcification. Global cardiac size within normal limits. No pericardial effusion. Central pulmonary arteries are of normal caliber. Thoracic aorta is unremarkable. Mediastinum/Nodes: Visualized thyroid is unremarkable. There has developed extensive pathologic mediastinal, bilateral hilar, and left axillary and subpectoral adenopathy in keeping with widespread thoracic nodal metastases. Pathologic adenopathy within the left hilum results in significant mass effect upon the mid esophagus. The esophagus is otherwise unremarkable. Lungs/Pleura: There are innumerable bilateral pulmonary nodules in keeping with widespread pulmonary metastases with irregular septal thickening noted within the lung apices suspicious for superimposed lymphangitic spread of malignancy. Multiple pleural soft tissue nodules are seen bilaterally in keeping with pleural metastatic disease. No pneumothorax or pleural effusion. There is superimposed nodular peribronchial airspace infiltrate within the right upper lobe which is associated with asymmetric bronchial wall thickening in may represent a superimposed right upper lobe bronchopneumonia. No central obstructing lesion. Musculoskeletal: There are sclerotic lesions involving the mid and distal right clavicle, left sixth rib, and the T3 and T8 vertebral bodies in keeping with osseous metastatic disease. Additionally, there is a destructive soft tissue mass which erodes the body of the sternum in keeping with a lytic osseous metastasis. This appears confluent with the dominant mass within the left breast and extends into the anterior mediastinum to abut the anterior pericardium. The previously noted left breast mass has markedly enlarged, now essentially replacing the left breast tissue, abutting the left chest wall and extending into the subpleural soft tissues and eroding through the dermis with extensive superficial ulceration. Heterogeneous enhancement of the mass suggests widespread areas of  mucinous or necrotic degeneration. CT ABDOMEN PELVIS FINDINGS Hepatobiliary: No focal liver abnormality is seen. No gallstones, gallbladder wall thickening, or biliary dilatation. Pancreas: Unremarkable Spleen: Evaluation of the spleen is limited by motion artifact, however, a wedge-shaped defect within the upper pole the spleen is in keeping with a acute to subacute splenic infarct. Adrenals/Urinary Tract: The adrenal glands are unremarkable. The kidneys are normal in size and position. Multiple wedge-shaped cortical defects are seen within the kidneys bilaterally, best appreciated within the upper pole of the right kidney and posterior interpolar region of the left kidney in keeping with multiple renal infarcts. 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 Multiple nonobstructing calculi are seen within the right kidney measuring up to 10 mm in greatest dimension. No additional renal or ureteral calculi. No hydronephrosis. The bladder is unremarkable. Stomach/Bowel: Large stool ball within the rectal vault suggests fecal impaction. Moderate stool burden. The stomach, small bowel, and large bowel are otherwise unremarkable. No free intraperitoneal gas or fluid. Vascular/Lymphatic: Aortic atherosclerosis. No enlarged abdominal or pelvic lymph nodes. Reproductive: Multiple calcified involuted uterine fibroids are seen within the uterus. No adnexal masses. Other: No abdominal wall hernia. Musculoskeletal: Large sacral decubitus ulcer is present with subcutaneous foci of gas both within the a  soft tissues dorsal to the sacrum as well as within the presacral space. There is marked attenuation of the overlying soft tissue and probable exposure of the sacrum with extensive lytic destruction of the mid and distal sacrum. 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 Advanced degenerative arthritis is noted of the hips bilaterally with marked remodeling of the humeral heads. Sclerotic metastases are seen within the L1, L2 and possibly the L4 vertebral bodies., IMPRESSION: 1. Marked interval enlargement of the previously noted left breast mass, now essentially replacing the left breast tissue, abutting the left chest wall and extending into the subpleural soft tissues and eroding through the dermis with extensive superficial ulceration. Heterogeneous enhancement of the mass suggests widespread areas of mucinous or necrotic degeneration. 2. Interval development of extensive pathologic mediastinal, bilateral hilar, and left axillary and subpectoral adenopathy in keeping with widespread thoracic nodal metastases. Pathologic adenopathy within the left hilum results in significant mass effect upon the mid esophagus. 3. Innumerable bilateral pulmonary nodules in keeping with widespread pulmonary metastases. Irregular septal thickening within the lung apices suspicious for superimposed lymphangitic spread of malignancy. 4. Superimposed nodular peribronchial airspace infiltrate within the right upper lobe which is associated with asymmetric bronchial wall thickening in may represent a superimposed right upper lobe bronchopneumonia. 5. Multiple wedge-shaped cortical defects within the kidneys bilaterally in keeping with multiple renal infarcts. 6. Acute to subacute splenic infarct. 7. Large sacral decubitus ulcer with marked attenuation of the overlying soft tissue and probable exposure of the sacrum with extensive lytic destruction of the mid and distal sacrum. 8. 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 Aortic  Atherosclerosis (ICD10-I70.0). Electronically Signed   By: Helyn Numbers M.D.   On: 05/08/2022 00:54   DG Chest Port 1 View  Result Date: 05/25/2022 CLINICAL DATA:  Altered EXAM: PORTABLE CHEST 1 VIEW COMPARISON:  07/01/2021 FINDINGS: Asymmetric soft tissue density over left chest likely corresponding to history of breast mass. New heterogeneous bilateral airspace opacities. No pleural effusion or pneumothorax. Normal cardiac size. Faint bilateral nodular lung opacities are concerning for metastatic disease. IMPRESSION: 1. Faint bilateral nodular lung opacities are concerning for metastatic disease. Diffuse heterogeneous interstitial and ground-glass opacities either due to pneumonia or metastatic disease. 2. Asymmetric soft tissue density over the left chest likely corresponding to history of breast mass. Electronically Signed   By: Jasmine Pang M.D.   On: 05/12/2022 23:32    Microbiology: Recent Results (from the past 240 hour(s))  Resp panel by RT-PCR (RSV, Flu A&B, Covid) Anterior Nasal Swab     Status: None   Collection Time: 05/23/2022 12:50 AM   Specimen: Anterior Nasal Swab  Result Value Ref Range Status   SARS Coronavirus 2 by RT PCR NEGATIVE NEGATIVE Final    Comment: (NOTE) SARS-CoV-2 target nucleic acids are NOT DETECTED.  The SARS-CoV-2 RNA is generally detectable in upper respiratory specimens during the acute phase of infection. The lowest concentration of SARS-CoV-2 viral copies this assay can detect is 138 copies/mL. A negative result does not preclude SARS-Cov-2 infection and should not be used as the sole basis for treatment or other patient management decisions. A negative result may occur with  improper specimen collection/handling, submission of specimen other than nasopharyngeal swab,  presence of viral mutation(s) within the areas targeted by this assay, and inadequate number of viral copies(<138 copies/mL). A negative result must be combined with clinical  observations, patient history, and epidemiological information. The expected result is Negative.  Fact Sheet for Patients:  BloggerCourse.com  Fact Sheet for Healthcare Providers:  SeriousBroker.it  This test is no t yet approved or cleared by the [place] FDA and  has been authorized for detection and/or diagnosis of SARS-CoV-2 by FDA under an Emergency Use Authorization (EUA). This EUA will remain  in effect (meaning this test can be used) for the duration of the COVID-19 declaration under Section 564(b)(1) of the Act, 21 U.S.C.section 360bbb-3(b)(1), unless the authorization is terminated  or revoked sooner.       Influenza A by PCR NEGATIVE NEGATIVE Final   Influenza B by PCR NEGATIVE NEGATIVE Final    Comment: (NOTE) The Xpert Xpress SARS-CoV-2/FLU/RSV plus assay is intended as an aid in the diagnosis of influenza from Nasopharyngeal swab specimens and should not be used as a sole basis for treatment. Nasal washings and aspirates are unacceptable for Xpert Xpress SARS-CoV-2/FLU/RSV testing.  Fact Sheet for Patients: BloggerCourse.com  Fact Sheet for Healthcare Providers: SeriousBroker.it  This test is not yet approved or cleared by the [place] FDA and has been authorized for detection and/or diagnosis of SARS-CoV-2 by FDA under an Emergency Use Authorization (EUA). This EUA will remain in effect (meaning this test can be used) for the duration of the COVID-19 declaration under Section 564(b)(1) of the Act, 21 U.S.C. section 360bbb-3(b)(1), unless the authorization is terminated or revoked.     Resp Syncytial Virus by PCR NEGATIVE NEGATIVE Final    Comment: (NOTE) Fact Sheet for Patients: BloggerCourse.com  Fact Sheet for Healthcare Providers: SeriousBroker.it  This test is not yet approved or cleared by  the [place] FDA and has been authorized for detection and/or diagnosis of SARS-CoV-2 by FDA under an Emergency Use Authorization (EUA). This EUA will remain in effect (meaning this test can be used) for the duration of the COVID-19 declaration under Section 564(b)(1) of the Act, 21 U.S.C. section 360bbb-3(b)(1), unless the authorization is terminated or revoked.  Performed at [facility], [address], [place], [place] [address]   Blood Culture (routine x 2)     Status: Abnormal (Preliminary result)   Collection Time: 05/26/2022 10:41 PM   Specimen: BLOOD  Result Value Ref Range Status   Specimen Description   Final    BLOOD BLOOD RIGHT ARM Performed at [facility], [address], [place], [place] [address]    Special Requests   Final    BOTTLES DRAWN AEROBIC AND ANAEROBIC Blood Culture results may not be optimal due to an inadequate volume of blood received in culture bottles Performed at [facility], [address], [place], [place] [address]    Culture  Setup Time   Final    GRAM NEGATIVE RODS IN BOTH AEROBIC AND ANAEROBIC BOTTLES CRITICAL RESULT CALLED TO, READ BACK BY AND VERIFIED WITH: PHARMD ELLEN JACKSON ON 05/08/22 @ 2154    Culture (A)  Final    PROTEUS MIRABILIS SUSCEPTIBILITIES PERFORMED ON PREVIOUS CULTURE WITHIN THE LAST 5 DAYS. Performed at [facility], [address] [place], [place] [address]    Report Status PENDING  Incomplete  Blood Culture (routine x 2)     Status: Abnormal   Collection Time: 06/02/2022 10:41 PM   Specimen: BLOOD  Result  Value Ref Range Status   Specimen Description   Final    BLOOD BLOOD RIGHT ARM Performed at [facility], [address] [place], [place] [address]    Special Requests   Final    BOTTLES DRAWN AEROBIC AND ANAEROBIC Blood Culture adequate volume Performed at [facility], [address] [place], [place]  [address]    Culture  Setup Time   Final    GRAM NEGATIVE RODS ANAEROBIC BOTTLE ONLY CRITICAL RESULT CALLED TO, READ BACK BY AND VERIFIED WITH: PHARMD LEANN POINDEXTER ON 05/08/22 @ 1741 BY DRT    Culture PROTEUS MIRABILIS (A)  Final   Report Status 05/10/2022 FINAL  Final   Organism ID, Bacteria PROTEUS MIRABILIS  Final      Susceptibility   Proteus mirabilis - MIC*    AMPICILLIN <=2 SENSITIVE Sensitive     CEFEPIME 0.5 SENSITIVE Sensitive     CEFTAZIDIME <=1 SENSITIVE Sensitive     CEFTRIAXONE <=0.25 SENSITIVE Sensitive     CIPROFLOXACIN <=0.25 SENSITIVE Sensitive     GENTAMICIN <=1 SENSITIVE Sensitive     IMIPENEM 2 SENSITIVE Sensitive     TRIMETH/SULFA <=20 SENSITIVE Sensitive     AMPICILLIN/SULBACTAM <=2 SENSITIVE Sensitive     PIP/TAZO <=4 SENSITIVE Sensitive     * PROTEUS MIRABILIS  Blood Culture ID Panel (Reflexed)     Status: Abnormal   Collection Time: 2022-06-04 10:41 PM  Result Value Ref Range Status   Enterococcus faecalis NOT DETECTED NOT DETECTED Final   Enterococcus Faecium NOT DETECTED NOT DETECTED Final   Listeria monocytogenes NOT DETECTED NOT DETECTED Final   Staphylococcus species NOT DETECTED NOT DETECTED Final   Staphylococcus aureus (BCID) NOT DETECTED NOT DETECTED Final   Staphylococcus epidermidis NOT DETECTED NOT DETECTED Final   Staphylococcus lugdunensis NOT DETECTED NOT DETECTED Final   Streptococcus species NOT DETECTED NOT DETECTED Final   Streptococcus agalactiae NOT DETECTED NOT DETECTED Final   Streptococcus pneumoniae NOT DETECTED NOT DETECTED Final   Streptococcus pyogenes NOT DETECTED NOT DETECTED Final   A.calcoaceticus-baumannii NOT DETECTED NOT DETECTED Final   Bacteroides fragilis NOT DETECTED NOT DETECTED Final   Enterobacterales DETECTED (A) NOT DETECTED Final    Comment: Enterobacterales represent a large order of gram negative bacteria, not a single organism. CRITICAL RESULT CALLED TO, READ BACK BY AND VERIFIED WITH: PHARMD LEANN  POINDEXTER ON 05/08/22 @ 1741 BY DRT    Enterobacter cloacae complex NOT DETECTED NOT DETECTED Final   Escherichia coli NOT DETECTED NOT DETECTED Final   Klebsiella aerogenes NOT DETECTED NOT DETECTED Final   Klebsiella oxytoca NOT DETECTED NOT DETECTED Final   Klebsiella pneumoniae NOT DETECTED NOT DETECTED Final   Proteus species DETECTED (A) NOT DETECTED Final    Comment: CRITICAL RESULT CALLED TO, READ BACK BY AND VERIFIED WITH: PHARMD LEANN POINDEXTER ON 05/08/22 @ 1741 BY DRT    Salmonella species NOT DETECTED NOT DETECTED Final   Serratia marcescens NOT DETECTED NOT DETECTED Final   Haemophilus influenzae NOT DETECTED NOT DETECTED Final   Neisseria meningitidis NOT DETECTED NOT DETECTED Final   Pseudomonas aeruginosa NOT DETECTED NOT DETECTED Final   Stenotrophomonas maltophilia NOT DETECTED NOT DETECTED Final   Candida albicans NOT DETECTED NOT DETECTED Final   Candida auris NOT DETECTED NOT DETECTED Final   Candida glabrata NOT DETECTED NOT DETECTED Final   Candida krusei NOT DETECTED NOT DETECTED Final   Candida parapsilosis NOT DETECTED NOT DETECTED Final   Candida tropicalis NOT DETECTED NOT  DETECTED Final   Cryptococcus neoformans/gattii NOT DETECTED NOT DETECTED Final   CTX-M ESBL NOT DETECTED NOT DETECTED Final   Carbapenem resistance IMP NOT DETECTED NOT DETECTED Final   Carbapenem resistance KPC NOT DETECTED NOT DETECTED Final   Carbapenem resistance NDM NOT DETECTED NOT DETECTED Final   Carbapenem resist OXA 48 LIKE NOT DETECTED NOT DETECTED Final   Carbapenem resistance VIM NOT DETECTED NOT DETECTED Final    Comment: Performed at [facility], [address] [place], [place] [address]  Urine Culture     Status: None   Collection Time: 05/08/22  5:24 AM   Specimen: Urine, Random  Result Value Ref Range Status   Specimen Description   Final    URINE, RANDOM Performed at [facility], [address] [place], [place] [address]    Special  Requests   Final    NONE Reflexed from [id number] Performed at [facility], [address] [place], [place] [address]    Culture   Final    NO GROWTH Performed at [facility], [address] [place], [place] [address]    Report Status 05/09/2022 FINAL  Final  MRSA Next Gen by PCR, Nasal     Status: Abnormal   Collection Time: 05/08/22 10:45 AM   Specimen: Nasal Mucosa; Nasal Swab  Result Value Ref Range Status   MRSA by PCR Next Gen DETECTED (A) NOT DETECTED Final    Comment: (NOTE) The GeneXpert MRSA Assay (FDA approved for NASAL specimens only), is one component of a comprehensive MRSA colonization surveillance program. It is not intended to diagnose MRSA infection nor to guide or monitor treatment for MRSA infections. Test performance is not FDA approved in patients less than 66 years old. Performed at [facility], [address] [place], [place] [id number]     Time spent: 45 minutes  Signed: Joseph ArtJessica U Sheridan Gettel, DO 05/14/2022

## 2022-06-06 DEATH — deceased
